# Patient Record
Sex: Male | Born: 1935 | Race: White | Hispanic: No | Marital: Married | Smoking: Former smoker
Health system: Southern US, Community
[De-identification: ages and names within clinical notes are randomized; demographics above are authoritative.]

## PROBLEM LIST (undated history)

## (undated) DIAGNOSIS — I4891 Unspecified atrial fibrillation: Secondary | ICD-10-CM

## (undated) DIAGNOSIS — I639 Cerebral infarction, unspecified: Secondary | ICD-10-CM

## (undated) DIAGNOSIS — H9190 Unspecified hearing loss, unspecified ear: Secondary | ICD-10-CM

## (undated) HISTORY — DX: Cerebral infarction, unspecified: I63.9

## (undated) HISTORY — DX: Unspecified hearing loss, unspecified ear: H91.90

---

## 2006-01-26 ENCOUNTER — Emergency Department (HOSPITAL_COMMUNITY): Admission: EM | Admit: 2006-01-26 | Discharge: 2006-01-26 | Payer: Self-pay | Admitting: Emergency Medicine

## 2016-03-06 DIAGNOSIS — I4892 Unspecified atrial flutter: Secondary | ICD-10-CM | POA: Insufficient documentation

## 2016-03-06 DIAGNOSIS — E7849 Other hyperlipidemia: Secondary | ICD-10-CM | POA: Insufficient documentation

## 2016-03-06 DIAGNOSIS — I509 Heart failure, unspecified: Secondary | ICD-10-CM | POA: Insufficient documentation

## 2016-03-06 DIAGNOSIS — I5022 Chronic systolic (congestive) heart failure: Secondary | ICD-10-CM | POA: Insufficient documentation

## 2016-03-06 DIAGNOSIS — I4891 Unspecified atrial fibrillation: Secondary | ICD-10-CM | POA: Insufficient documentation

## 2016-03-06 DIAGNOSIS — I619 Nontraumatic intracerebral hemorrhage, unspecified: Secondary | ICD-10-CM | POA: Insufficient documentation

## 2016-05-01 DIAGNOSIS — I10 Essential (primary) hypertension: Secondary | ICD-10-CM | POA: Insufficient documentation

## 2018-03-13 DIAGNOSIS — J309 Allergic rhinitis, unspecified: Secondary | ICD-10-CM | POA: Insufficient documentation

## 2018-03-13 DIAGNOSIS — H9190 Unspecified hearing loss, unspecified ear: Secondary | ICD-10-CM | POA: Insufficient documentation

## 2018-03-13 DIAGNOSIS — D61818 Other pancytopenia: Secondary | ICD-10-CM | POA: Insufficient documentation

## 2018-03-13 DIAGNOSIS — G4733 Obstructive sleep apnea (adult) (pediatric): Secondary | ICD-10-CM | POA: Insufficient documentation

## 2018-03-13 DIAGNOSIS — E039 Hypothyroidism, unspecified: Secondary | ICD-10-CM | POA: Insufficient documentation

## 2018-03-13 DIAGNOSIS — Z8619 Personal history of other infectious and parasitic diseases: Secondary | ICD-10-CM | POA: Insufficient documentation

## 2018-03-13 DIAGNOSIS — K5900 Constipation, unspecified: Secondary | ICD-10-CM | POA: Insufficient documentation

## 2018-03-13 DIAGNOSIS — N400 Enlarged prostate without lower urinary tract symptoms: Secondary | ICD-10-CM | POA: Insufficient documentation

## 2018-04-22 DIAGNOSIS — N320 Bladder-neck obstruction: Secondary | ICD-10-CM | POA: Insufficient documentation

## 2018-10-14 DIAGNOSIS — H0100A Unspecified blepharitis right eye, upper and lower eyelids: Secondary | ICD-10-CM | POA: Insufficient documentation

## 2018-10-14 DIAGNOSIS — R7303 Prediabetes: Secondary | ICD-10-CM | POA: Insufficient documentation

## 2018-10-14 DIAGNOSIS — H43812 Vitreous degeneration, left eye: Secondary | ICD-10-CM | POA: Insufficient documentation

## 2018-10-14 DIAGNOSIS — H02831 Dermatochalasis of right upper eyelid: Secondary | ICD-10-CM | POA: Insufficient documentation

## 2018-11-14 DIAGNOSIS — E538 Deficiency of other specified B group vitamins: Secondary | ICD-10-CM | POA: Insufficient documentation

## 2019-03-13 DIAGNOSIS — R053 Chronic cough: Secondary | ICD-10-CM | POA: Insufficient documentation

## 2019-08-05 DIAGNOSIS — D469 Myelodysplastic syndrome, unspecified: Secondary | ICD-10-CM | POA: Insufficient documentation

## 2020-07-27 DIAGNOSIS — R9389 Abnormal findings on diagnostic imaging of other specified body structures: Secondary | ICD-10-CM | POA: Insufficient documentation

## 2020-08-06 DIAGNOSIS — I609 Nontraumatic subarachnoid hemorrhage, unspecified: Secondary | ICD-10-CM | POA: Insufficient documentation

## 2020-08-24 DIAGNOSIS — J984 Other disorders of lung: Secondary | ICD-10-CM | POA: Insufficient documentation

## 2020-09-09 DIAGNOSIS — R972 Elevated prostate specific antigen [PSA]: Secondary | ICD-10-CM | POA: Insufficient documentation

## 2020-09-09 DIAGNOSIS — N402 Nodular prostate without lower urinary tract symptoms: Secondary | ICD-10-CM | POA: Insufficient documentation

## 2020-09-24 DIAGNOSIS — R300 Dysuria: Secondary | ICD-10-CM | POA: Insufficient documentation

## 2021-08-09 DIAGNOSIS — H401131 Primary open-angle glaucoma, bilateral, mild stage: Secondary | ICD-10-CM | POA: Insufficient documentation

## 2021-08-09 DIAGNOSIS — H18513 Endothelial corneal dystrophy, bilateral: Secondary | ICD-10-CM | POA: Insufficient documentation

## 2021-08-09 DIAGNOSIS — H04123 Dry eye syndrome of bilateral lacrimal glands: Secondary | ICD-10-CM | POA: Insufficient documentation

## 2021-09-12 ENCOUNTER — Inpatient Hospital Stay (HOSPITAL_COMMUNITY): Payer: Medicare Other

## 2021-09-12 ENCOUNTER — Emergency Department (HOSPITAL_COMMUNITY): Payer: Medicare Other

## 2021-09-12 ENCOUNTER — Inpatient Hospital Stay (HOSPITAL_COMMUNITY)
Admission: EM | Admit: 2021-09-12 | Discharge: 2021-09-15 | DRG: 023 | Disposition: A | Discharge status: Home Health | Payer: Medicare Other | Attending: Interventional Radiology | Admitting: Interventional Radiology

## 2021-09-12 ENCOUNTER — Emergency Department (HOSPITAL_COMMUNITY): Payer: Medicare Other | Admitting: Certified Registered Nurse Anesthetist

## 2021-09-12 ENCOUNTER — Encounter (HOSPITAL_COMMUNITY): Payer: Self-pay | Admitting: Emergency Medicine

## 2021-09-12 ENCOUNTER — Encounter (HOSPITAL_COMMUNITY)
Admission: EM | Disposition: A | Discharge status: Home Health | Payer: Self-pay | Source: Home / Self Care | Attending: Neurology

## 2021-09-12 ENCOUNTER — Emergency Department (EMERGENCY_DEPARTMENT_HOSPITAL): Payer: Medicare Other | Admitting: Certified Registered Nurse Anesthetist

## 2021-09-12 ENCOUNTER — Other Ambulatory Visit: Payer: Self-pay

## 2021-09-12 DIAGNOSIS — I5042 Chronic combined systolic (congestive) and diastolic (congestive) heart failure: Secondary | ICD-10-CM | POA: Diagnosis present

## 2021-09-12 DIAGNOSIS — D61818 Other pancytopenia: Secondary | ICD-10-CM | POA: Diagnosis present

## 2021-09-12 DIAGNOSIS — D539 Nutritional anemia, unspecified: Secondary | ICD-10-CM | POA: Diagnosis present

## 2021-09-12 DIAGNOSIS — T82538A Leakage of other cardiac and vascular devices and implants, initial encounter: Secondary | ICD-10-CM | POA: Diagnosis present

## 2021-09-12 DIAGNOSIS — I63311 Cerebral infarction due to thrombosis of right middle cerebral artery: Principal | ICD-10-CM | POA: Diagnosis present

## 2021-09-12 DIAGNOSIS — Z8249 Family history of ischemic heart disease and other diseases of the circulatory system: Secondary | ICD-10-CM

## 2021-09-12 DIAGNOSIS — I700 Atherosclerosis of aorta: Secondary | ICD-10-CM | POA: Diagnosis present

## 2021-09-12 DIAGNOSIS — I63511 Cerebral infarction due to unspecified occlusion or stenosis of right middle cerebral artery: Secondary | ICD-10-CM | POA: Diagnosis not present

## 2021-09-12 DIAGNOSIS — I4821 Permanent atrial fibrillation: Secondary | ICD-10-CM | POA: Diagnosis present

## 2021-09-12 DIAGNOSIS — N400 Enlarged prostate without lower urinary tract symptoms: Secondary | ICD-10-CM | POA: Diagnosis present

## 2021-09-12 DIAGNOSIS — I4891 Unspecified atrial fibrillation: Secondary | ICD-10-CM | POA: Diagnosis not present

## 2021-09-12 DIAGNOSIS — R29810 Facial weakness: Secondary | ICD-10-CM | POA: Diagnosis present

## 2021-09-12 DIAGNOSIS — I428 Other cardiomyopathies: Secondary | ICD-10-CM | POA: Diagnosis present

## 2021-09-12 DIAGNOSIS — I110 Hypertensive heart disease with heart failure: Secondary | ICD-10-CM | POA: Diagnosis present

## 2021-09-12 DIAGNOSIS — I6601 Occlusion and stenosis of right middle cerebral artery: Secondary | ICD-10-CM | POA: Diagnosis not present

## 2021-09-12 DIAGNOSIS — Y712 Prosthetic and other implants, materials and accessory cardiovascular devices associated with adverse incidents: Secondary | ICD-10-CM | POA: Diagnosis present

## 2021-09-12 DIAGNOSIS — Z781 Physical restraint status: Secondary | ICD-10-CM

## 2021-09-12 DIAGNOSIS — G4733 Obstructive sleep apnea (adult) (pediatric): Secondary | ICD-10-CM | POA: Diagnosis present

## 2021-09-12 DIAGNOSIS — R29707 NIHSS score 7: Secondary | ICD-10-CM | POA: Diagnosis present

## 2021-09-12 DIAGNOSIS — G8191 Hemiplegia, unspecified affecting right dominant side: Secondary | ICD-10-CM | POA: Diagnosis present

## 2021-09-12 DIAGNOSIS — E1159 Type 2 diabetes mellitus with other circulatory complications: Secondary | ICD-10-CM

## 2021-09-12 DIAGNOSIS — R451 Restlessness and agitation: Secondary | ICD-10-CM | POA: Diagnosis not present

## 2021-09-12 DIAGNOSIS — R471 Dysarthria and anarthria: Secondary | ICD-10-CM | POA: Diagnosis present

## 2021-09-12 DIAGNOSIS — R414 Neurologic neglect syndrome: Secondary | ICD-10-CM | POA: Diagnosis present

## 2021-09-12 DIAGNOSIS — J9589 Other postprocedural complications and disorders of respiratory system, not elsewhere classified: Secondary | ICD-10-CM | POA: Diagnosis not present

## 2021-09-12 DIAGNOSIS — E039 Hypothyroidism, unspecified: Secondary | ICD-10-CM | POA: Diagnosis present

## 2021-09-12 DIAGNOSIS — Z95818 Presence of other cardiac implants and grafts: Secondary | ICD-10-CM

## 2021-09-12 DIAGNOSIS — I361 Nonrheumatic tricuspid (valve) insufficiency: Secondary | ICD-10-CM | POA: Diagnosis not present

## 2021-09-12 DIAGNOSIS — G9751 Postprocedural hemorrhage and hematoma of a nervous system organ or structure following a nervous system procedure: Secondary | ICD-10-CM | POA: Diagnosis not present

## 2021-09-12 DIAGNOSIS — Z20822 Contact with and (suspected) exposure to covid-19: Secondary | ICD-10-CM | POA: Diagnosis present

## 2021-09-12 DIAGNOSIS — J84111 Idiopathic interstitial pneumonia, not otherwise specified: Secondary | ICD-10-CM | POA: Diagnosis present

## 2021-09-12 DIAGNOSIS — I639 Cerebral infarction, unspecified: Secondary | ICD-10-CM | POA: Diagnosis present

## 2021-09-12 DIAGNOSIS — I152 Hypertension secondary to endocrine disorders: Secondary | ICD-10-CM

## 2021-09-12 DIAGNOSIS — J952 Acute pulmonary insufficiency following nonthoracic surgery: Secondary | ICD-10-CM | POA: Diagnosis not present

## 2021-09-12 DIAGNOSIS — Z8673 Personal history of transient ischemic attack (TIA), and cerebral infarction without residual deficits: Secondary | ICD-10-CM | POA: Diagnosis not present

## 2021-09-12 DIAGNOSIS — R1310 Dysphagia, unspecified: Secondary | ICD-10-CM | POA: Diagnosis present

## 2021-09-12 DIAGNOSIS — I340 Nonrheumatic mitral (valve) insufficiency: Secondary | ICD-10-CM | POA: Diagnosis not present

## 2021-09-12 DIAGNOSIS — E785 Hyperlipidemia, unspecified: Secondary | ICD-10-CM | POA: Diagnosis present

## 2021-09-12 DIAGNOSIS — R0989 Other specified symptoms and signs involving the circulatory and respiratory systems: Secondary | ICD-10-CM | POA: Diagnosis not present

## 2021-09-12 DIAGNOSIS — I482 Chronic atrial fibrillation, unspecified: Secondary | ICD-10-CM | POA: Diagnosis not present

## 2021-09-12 DIAGNOSIS — I6389 Other cerebral infarction: Secondary | ICD-10-CM | POA: Diagnosis not present

## 2021-09-12 HISTORY — PX: IR CT HEAD LTD: IMG2386

## 2021-09-12 HISTORY — DX: Unspecified atrial fibrillation: I48.91

## 2021-09-12 HISTORY — PX: IR PERCUTANEOUS ART THROMBECTOMY/INFUSION INTRACRANIAL INC DIAG ANGIO: IMG6087

## 2021-09-12 HISTORY — PX: RADIOLOGY WITH ANESTHESIA: SHX6223

## 2021-09-12 LAB — COMPREHENSIVE METABOLIC PANEL
ALT: 17 U/L (ref 0–44)
AST: 19 U/L (ref 15–41)
Albumin: 4 g/dL (ref 3.5–5.0)
Alkaline Phosphatase: 82 U/L (ref 38–126)
Anion gap: 7 (ref 5–15)
BUN: 16 mg/dL (ref 8–23)
CO2: 26 mmol/L (ref 22–32)
Calcium: 9.2 mg/dL (ref 8.9–10.3)
Chloride: 104 mmol/L (ref 98–111)
Creatinine, Ser: 1.07 mg/dL (ref 0.61–1.24)
GFR, Estimated: >60 mL/min (ref >60)
Glucose, Bld: 139 mg/dL — ABNORMAL HIGH (ref 70–99)
Potassium: 3.9 mmol/L (ref 3.5–5.1)
Sodium: 137 mmol/L (ref 135–145)
Total Bilirubin: 0.7 mg/dL (ref 0.3–1.2)
Total Protein: 6.8 g/dL (ref 6.5–8.1)

## 2021-09-12 LAB — POCT I-STAT 7, (LYTES, BLD GAS, ICA,H+H)
Acid-Base Excess: 4 mmol/L — ABNORMAL HIGH (ref 0.0–2.0)
Bicarbonate: 27.5 mmol/L (ref 20.0–28.0)
Calcium, Ion: 1.18 mmol/L (ref 1.15–1.40)
HCT: 31 % — ABNORMAL LOW (ref 39.0–52.0)
Hemoglobin: 10.5 g/dL — ABNORMAL LOW (ref 13.0–17.0)
O2 Saturation: 100 %
Patient temperature: 97.8
Potassium: 4.2 mmol/L (ref 3.5–5.1)
Sodium: 141 mmol/L (ref 135–145)
TCO2: 29 mmol/L (ref 22–32)
pCO2 arterial: 35.1 mmHg (ref 32–48)
pH, Arterial: 7.501 — ABNORMAL HIGH (ref 7.35–7.45)
pO2, Arterial: 224 mmHg — ABNORMAL HIGH (ref 83–108)

## 2021-09-12 LAB — CBC
HCT: 36.3 % — ABNORMAL LOW (ref 39.0–52.0)
Hemoglobin: 12 g/dL — ABNORMAL LOW (ref 13.0–17.0)
MCH: 33.1 pg (ref 26.0–34.0)
MCHC: 33.1 g/dL (ref 30.0–36.0)
MCV: 100.3 fL — ABNORMAL HIGH (ref 80.0–100.0)
Platelets: 126 10*3/uL — ABNORMAL LOW (ref 150–400)
RBC: 3.62 MIL/uL — ABNORMAL LOW (ref 4.22–5.81)
RDW: 13.8 % (ref 11.5–15.5)
WBC: 4.7 10*3/uL (ref 4.0–10.5)
nRBC: 0 % (ref 0.0–0.2)

## 2021-09-12 LAB — LIPID PANEL
Cholesterol: 175 mg/dL (ref 0–200)
HDL: 59 mg/dL (ref >40)
LDL Cholesterol: 103 mg/dL — ABNORMAL HIGH (ref 0–99)
Total CHOL/HDL Ratio: 3 RATIO
Triglycerides: 63 mg/dL (ref <150)
VLDL: 13 mg/dL (ref 0–40)

## 2021-09-12 LAB — I-STAT CHEM 8, ED
BUN: 20 mg/dL (ref 8–23)
Calcium, Ion: 1.17 mmol/L (ref 1.15–1.40)
Chloride: 102 mmol/L (ref 98–111)
Creatinine, Ser: 1 mg/dL (ref 0.61–1.24)
Glucose, Bld: 139 mg/dL — ABNORMAL HIGH (ref 70–99)
HCT: 39 % (ref 39.0–52.0)
Hemoglobin: 13.3 g/dL (ref 13.0–17.0)
Potassium: 4.1 mmol/L (ref 3.5–5.1)
Sodium: 140 mmol/L (ref 135–145)
TCO2: 29 mmol/L (ref 22–32)

## 2021-09-12 LAB — CBG MONITORING, ED: Glucose-Capillary: 130 mg/dL — ABNORMAL HIGH (ref 70–99)

## 2021-09-12 LAB — URINALYSIS, COMPLETE (UACMP) WITH MICROSCOPIC
Bacteria, UA: NONE SEEN
Bilirubin Urine: NEGATIVE
Glucose, UA: NEGATIVE mg/dL
Hgb urine dipstick: NEGATIVE
Ketones, ur: NEGATIVE mg/dL
Leukocytes,Ua: NEGATIVE
Nitrite: NEGATIVE
Protein, ur: NEGATIVE mg/dL
Specific Gravity, Urine: 1.016 (ref 1.005–1.030)
pH: 7 (ref 5.0–8.0)

## 2021-09-12 LAB — DIFFERENTIAL
Abs Immature Granulocytes: 0.01 10*3/uL (ref 0.00–0.07)
Basophils Absolute: 0 10*3/uL (ref 0.0–0.1)
Basophils Relative: 0 %
Eosinophils Absolute: 0.3 10*3/uL (ref 0.0–0.5)
Eosinophils Relative: 7 %
Immature Granulocytes: 0 %
Lymphocytes Relative: 36 %
Lymphs Abs: 1.7 10*3/uL (ref 0.7–4.0)
Monocytes Absolute: 0.7 10*3/uL (ref 0.1–1.0)
Monocytes Relative: 14 %
Neutro Abs: 2 10*3/uL (ref 1.7–7.7)
Neutrophils Relative %: 43 %

## 2021-09-12 LAB — RESP PANEL BY RT-PCR (FLU A&B, COVID) ARPGX2
Influenza A by PCR: NEGATIVE
Influenza B by PCR: NEGATIVE
SARS Coronavirus 2 by RT PCR: NEGATIVE

## 2021-09-12 LAB — RAPID URINE DRUG SCREEN, HOSP PERFORMED
Amphetamines: NOT DETECTED
Barbiturates: NOT DETECTED
Benzodiazepines: NOT DETECTED
Cocaine: NOT DETECTED
Opiates: NOT DETECTED
Tetrahydrocannabinol: NOT DETECTED

## 2021-09-12 LAB — HEMOGLOBIN A1C
Hgb A1c MFr Bld: 5.7 % — ABNORMAL HIGH (ref 4.8–5.6)
Mean Plasma Glucose: 116.89 mg/dL

## 2021-09-12 LAB — GLUCOSE, CAPILLARY
Glucose-Capillary: 93 mg/dL (ref 70–99)
Glucose-Capillary: 146 mg/dL — ABNORMAL HIGH (ref 70–99)

## 2021-09-12 LAB — PROTIME-INR
INR: 1 (ref 0.8–1.2)
Prothrombin Time: 13.5 seconds (ref 11.4–15.2)

## 2021-09-12 LAB — MRSA NEXT GEN BY PCR, NASAL: MRSA by PCR Next Gen: NOT DETECTED

## 2021-09-12 LAB — APTT: aPTT: 29 seconds (ref 24–36)

## 2021-09-12 SURGERY — IR WITH ANESTHESIA
Anesthesia: General

## 2021-09-12 MED ORDER — ORAL CARE MOUTH RINSE
15.0000 mL | OROMUCOSAL | Status: DC
  Administered 2021-09-12 – 2021-09-13 (×8): 15 mL via OROMUCOSAL

## 2021-09-12 MED ORDER — ACETAMINOPHEN 160 MG/5ML PO SOLN
650.0000 mg | ORAL | Status: DC | PRN
Start: 2021-09-12 — End: 2021-09-12

## 2021-09-12 MED ORDER — PROPOFOL 10 MG/ML IV BOLUS
INTRAVENOUS | Status: DC | PRN
Start: 2021-09-12 — End: 2021-09-12
  Administered 2021-09-12: 50 mg via INTRAVENOUS
  Administered 2021-09-12: 110 mg via INTRAVENOUS

## 2021-09-12 MED ORDER — SUCCINYLCHOLINE CHLORIDE 200 MG/10ML IV SOSY
PREFILLED_SYRINGE | INTRAVENOUS | Status: DC | PRN
  Administered 2021-09-12: 120 mg via INTRAVENOUS

## 2021-09-12 MED ORDER — CEFAZOLIN SODIUM-DEXTROSE 2-3 GM-%(50ML) IV SOLR
INTRAVENOUS | Status: DC | PRN
  Administered 2021-09-12: 2 g via INTRAVENOUS

## 2021-09-12 MED ORDER — ACETAMINOPHEN 325 MG PO TABS: 650.0000 mg | ORAL_TABLET | ORAL | Status: DC | PRN

## 2021-09-12 MED ORDER — SODIUM CHLORIDE 0.9% FLUSH: 3.0000 mL | Freq: Once | INTRAVENOUS | Status: DC

## 2021-09-12 MED ORDER — ONDANSETRON HCL 4 MG/2ML IJ SOLN
INTRAMUSCULAR | Status: DC | PRN
  Administered 2021-09-12: 4 mg via INTRAVENOUS

## 2021-09-12 MED ORDER — PANTOPRAZOLE 2 MG/ML SUSPENSION
40.0000 mg | Freq: Every day | ORAL | Status: DC
  Administered 2021-09-12 – 2021-09-13 (×2): 40 mg
  Filled 2021-09-12 (×2): qty 20

## 2021-09-12 MED ORDER — CHLORHEXIDINE GLUCONATE 0.12% ORAL RINSE (MEDLINE KIT)
15.0000 mL | Freq: Two times a day (BID) | OROMUCOSAL | Status: DC
  Administered 2021-09-12 – 2021-09-13 (×2): 15 mL via OROMUCOSAL

## 2021-09-12 MED ORDER — ACETAMINOPHEN 650 MG RE SUPP: 650.0000 mg | RECTAL | Status: DC | PRN

## 2021-09-12 MED ORDER — DEXAMETHASONE SODIUM PHOSPHATE 10 MG/ML IJ SOLN
INTRAMUSCULAR | Status: DC | PRN
  Administered 2021-09-12: 10 mg via INTRAVENOUS

## 2021-09-12 MED ORDER — MIDAZOLAM HCL 2 MG/2ML IJ SOLN: 1.0000 mg | INTRAMUSCULAR | Status: DC | PRN

## 2021-09-12 MED ORDER — ACETAMINOPHEN 325 MG PO TABS
650.0000 mg | ORAL_TABLET | ORAL | Status: DC | PRN
  Administered 2021-09-15: 650 mg via ORAL
  Filled 2021-09-12: qty 2

## 2021-09-12 MED ORDER — IOHEXOL 300 MG/ML  SOLN
100.0000 mL | Freq: Once | INTRAMUSCULAR | Status: AC | PRN
  Administered 2021-09-12: 60 mL via INTRA_ARTERIAL

## 2021-09-12 MED ORDER — ACETAMINOPHEN 160 MG/5ML PO SOLN
650.0000 mg | ORAL | Status: DC | PRN
Start: 2021-09-12 — End: 2021-09-15

## 2021-09-12 MED ORDER — CLEVIDIPINE BUTYRATE 0.5 MG/ML IV EMUL
INTRAVENOUS | Status: AC
  Filled 2021-09-12: qty 50

## 2021-09-12 MED ORDER — SODIUM CHLORIDE 0.9 % IV SOLN: INTRAVENOUS | Status: DC

## 2021-09-12 MED ORDER — FENTANYL CITRATE PF 50 MCG/ML IJ SOSY: 25.0000 ug | PREFILLED_SYRINGE | INTRAMUSCULAR | Status: DC | PRN

## 2021-09-12 MED ORDER — SODIUM CHLORIDE 0.9 % IV SOLN: INTRAVENOUS | Status: DC | PRN

## 2021-09-12 MED ORDER — CEFAZOLIN SODIUM-DEXTROSE 2-4 GM/100ML-% IV SOLN
INTRAVENOUS | Status: AC
  Filled 2021-09-12: qty 100

## 2021-09-12 MED ORDER — PHENYLEPHRINE HCL-NACL 20-0.9 MG/250ML-% IV SOLN
INTRAVENOUS | Status: DC | PRN
  Administered 2021-09-12: 20 ug/min via INTRAVENOUS

## 2021-09-12 MED ORDER — FENTANYL CITRATE PF 50 MCG/ML IJ SOSY
25.0000 ug | PREFILLED_SYRINGE | INTRAMUSCULAR | Status: DC | PRN
  Administered 2021-09-13 (×2): 50 ug via INTRAVENOUS
  Filled 2021-09-12: qty 1
  Filled 2021-09-12: qty 2

## 2021-09-12 MED ORDER — CLEVIDIPINE BUTYRATE 0.5 MG/ML IV EMUL
0.0000 mg/h | INTRAVENOUS | Status: AC
  Administered 2021-09-12: 14 mg/h via INTRAVENOUS
  Administered 2021-09-12: 2 mg/h via INTRAVENOUS
  Administered 2021-09-13: 4 mg/h via INTRAVENOUS
  Filled 2021-09-12 (×2): qty 50

## 2021-09-12 MED ORDER — POLYETHYLENE GLYCOL 3350 17 G PO PACK
17.0000 g | PACK | Freq: Every day | ORAL | Status: DC
  Administered 2021-09-13: 17 g
  Filled 2021-09-12 (×2): qty 1

## 2021-09-12 MED ORDER — PHENYLEPHRINE 80 MCG/ML (10ML) SYRINGE FOR IV PUSH (FOR BLOOD PRESSURE SUPPORT)
PREFILLED_SYRINGE | INTRAVENOUS | Status: DC | PRN
  Administered 2021-09-12: 80 ug via INTRAVENOUS

## 2021-09-12 MED ORDER — NITROGLYCERIN 1 MG/10 ML FOR IR/CATH LAB
INTRA_ARTERIAL | Status: AC
  Filled 2021-09-12: qty 10

## 2021-09-12 MED ORDER — MIDAZOLAM HCL 2 MG/2ML IJ SOLN
1.0000 mg | INTRAMUSCULAR | Status: DC | PRN
  Administered 2021-09-13: 1 mg via INTRAVENOUS
  Filled 2021-09-12: qty 2

## 2021-09-12 MED ORDER — DOCUSATE SODIUM 50 MG/5ML PO LIQD
100.0000 mg | Freq: Two times a day (BID) | ORAL | Status: DC
  Administered 2021-09-12 – 2021-09-13 (×2): 100 mg
  Filled 2021-09-12 (×2): qty 10

## 2021-09-12 MED ORDER — PROPOFOL 1000 MG/100ML IV EMUL
0.0000 ug/kg/min | INTRAVENOUS | Status: DC
  Administered 2021-09-12 (×2): 40 ug/kg/min via INTRAVENOUS
  Administered 2021-09-12: 50 ug/kg/min via INTRAVENOUS
  Administered 2021-09-13: 35 ug/kg/min via INTRAVENOUS
  Administered 2021-09-13: 50 ug/kg/min via INTRAVENOUS
  Filled 2021-09-12: qty 200
  Filled 2021-09-12: qty 100

## 2021-09-12 MED ORDER — IOHEXOL 350 MG/ML SOLN
100.0000 mL | Freq: Once | INTRAVENOUS | Status: AC | PRN
  Administered 2021-09-12: 70 mL via INTRAVENOUS

## 2021-09-12 MED ORDER — ROCURONIUM BROMIDE 10 MG/ML (PF) SYRINGE
PREFILLED_SYRINGE | INTRAVENOUS | Status: DC | PRN
  Administered 2021-09-12: 40 mg via INTRAVENOUS
  Administered 2021-09-12: 50 mg via INTRAVENOUS

## 2021-09-12 MED ORDER — DOCUSATE SODIUM 50 MG/5ML PO LIQD: 100.0000 mg | Freq: Two times a day (BID) | ORAL | Status: DC

## 2021-09-12 MED ORDER — STROKE: EARLY STAGES OF RECOVERY BOOK
Freq: Once | Status: AC
Start: 2021-09-12 — End: 2021-09-12
  Filled 2021-09-12: qty 1

## 2021-09-12 MED ORDER — LIDOCAINE 2% (20 MG/ML) 5 ML SYRINGE
INTRAMUSCULAR | Status: DC | PRN
  Administered 2021-09-12: 60 mg via INTRAVENOUS

## 2021-09-12 NOTE — Anesthesia Procedure Notes (Addendum)
Procedure Name: Intubation Date/Time: 09/12/2021 4:03 PM Performed by: Genelle Bal, CRNA Pre-anesthesia Checklist: Patient identified, Emergency Drugs available, Suction available and Patient being monitored Patient Re-evaluated:Patient Re-evaluated prior to induction Oxygen Delivery Method: Circle system utilized Preoxygenation: Pre-oxygenation with 100% oxygen Induction Type: IV induction and Rapid sequence Laryngoscope Size: Miller and 2 Grade View: Grade I Tube type: Oral Tube size: 7.5 mm Number of attempts: 1 Airway Equipment and Method: Stylet Placement Confirmation: ETT inserted through vocal cords under direct vision, positive ETCO2 and breath sounds checked- equal and bilateral Secured at: 23 cm Tube secured with: Tape Dental Injury: Teeth and Oropharynx as per pre-operative assessment

## 2021-09-12 NOTE — Transfer of Care (Signed)
Immediate Anesthesia Transfer of Care Note  Patient: Elijah Stone  Procedure(s) Performed: IR WITH ANESTHESIA  Patient Location: ICU  Anesthesia Type:General  Level of Consciousness: Patient remains intubated per anesthesia plan  Airway & Oxygen Therapy: Patient remains intubated per anesthesia plan and Patient placed on Ventilator (see vital sign flow sheet for setting)  Post-op Assessment: Report given to RN and Post -op Vital signs reviewed and stable  Post vital signs: Reviewed and stable  Last Vitals:  Vitals Value Taken Time  BP 125/86 09/12/21 1733  Temp    Pulse 92 09/12/21 1732  Resp 16 09/12/21 1732  SpO2 100 % 09/12/21 1732  Vitals shown include unvalidated device data.  Last Pain:  Vitals:   09/12/21 1511  PainSc: 0-No pain         Complications: No notable events documented.

## 2021-09-12 NOTE — Progress Notes (Signed)
Patient ID: Elijah Stone, male   DOB: 1931/02/05, 88 y.o.   MRN: [id number]. INR. 88 year old right-handed gentleman.  Last seen well 2 PM today.  Modified Rankin score of 0. New onset of left-sided weakness, left facial droop dysarthria and neglect. CT of brain no intracranial hemorrhage.  Aspects 10.  CTA occluded distal M2 region of the inferior division of the right middle cerebral artery.  Endovascular treatment discussed with the spouse and the son.  The procedure, the reasons, and the alternatives were reviewed.  Risks of intracranial hemorrhage of 10% with worsening neurological condition, death and inability to revascularize were reviewed.  They both expressed understanding, and informed consent was obtained from the spouse for the treatment.  Arlean Hopping MD

## 2021-09-12 NOTE — H&P (Cosign Needed Addendum)
Neurology H&P  CC: Code Stroke  History is obtained from:Patient, family  HPI: Elijah Stone is a 88 y.o. male  [place] in 2021, afib with watchman on aspirin, CHF, SAH, pancytopenia, HTN, hypothyroidism, BPH, OSA with cpap initially presenting with left facial droop, left arm weakness, and dysarthria. Symptoms fluctuated throughout assessment and CT scan. CTA showed a right M2 occlusion, code IR activated.     LKW: 1400 tpa given?: No, recent SAH IR Thrombectomy? Yes Modified Rankin Scale: 1-No significant post stroke disability and can perform usual duties with stroke symptoms  NIHSS components Score: Comment  1a Level of Conscious 0'[x]'$  1'[]'$  2'[]'$  3'[]'$         1b LOC Questions 0'[x]'$  1'[]'$  2'[]'$           1c LOC Commands 0'[x]'$  1'[]'$  2'[]'$           2 Best Gaze 0'[]'$  1'[]'$  2'[]'$           3 Visual 0'[]'$  1'[x]'$  2'[]'$  3'[]'$         4 Facial Palsy 0'[]'$  1'[x]'$  2'[]'$  3'[]'$         5a Motor Arm - left 0'[]'$  1'[x]'$  2'[]'$  3'[]'$  4'[]'$  UN'[]'$     5b Motor Arm - Right 0'[x]'$  1'[]'$  2'[]'$  3'[]'$  4'[]'$  UN'[]'$     6a Motor Leg - Left 0'[x]'$  1'[]'$  2'[]'$  3'[]'$  4'[]'$  UN'[]'$     6b Motor Leg - Right 0'[x]'$  1'[]'$  2'[]'$  3'[]'$  4'[]'$  UN'[]'$     7 Limb Ataxia 0'[]'$  1'[x]'$  2'[]'$  3'[]'$  UN'[]'$       8 Sensory 0'[]'$  1'[x]'$  2'[]'$  UN'[]'$         9 Best Language 0'[]'$  1'[]'$  2'[]'$  3'[]'$         10 Dysarthria 0'[]'$  1'[x]'$  2'[]'$  UN'[]'$         11 Extinct. and Inattention 0'[]'$  1'[x]'$  2'[]'$           TOTAL: 7         ROS: A complete ROS was performed and is negative except as noted in the HPI.   Past Medical History:  Diagnosis Date   Atrial fibrillation ([place])      No family history on file.   Social History:  has no history on file for tobacco use, alcohol use, and drug use.   Prior to Admission medications   Not on File   aspirin 81 MG chewable tablet *ANTIPLATELET*  Sig - Route: Chew 1 tablet (81 mg total) by mouth daily. - Oral  Class: Historical Med  carboxymethylcellulose sodium (REFRESH OPHT)  Sig - Route: Place 1-2 drops into both eyes 2 times daily. - Both Eyes  Class: Historical Med  cholecalciferol, vitamin D3, 25 mcg  (1,000 unit) capsule  Sig - Route: Take 1 capsule (1,000 Units total) by mouth daily. - Oral  Class: Historical Med  cyanocobalamin (VITAMIN B12) 500 mcg sublingual tablet  Sig - Route: Take 1 tablet (500 mcg total) by mouth daily. - Oral  Class: OTC  cycloSPORINE (RESTASIS) 0.05 % ophthalmic emulsion  Sig - Route: Place 1 drop into both eyes 2 times daily. - Both Eyes  ENTRESTO 24-26 mg per tablet  Sig: TAKE 1 TABLET BY MOUTH TWICE DAILY  fluocinonide (LIDEX) 0.05 % cream  Sig - Route: Apply 1 application topically 2 (two) times daily as needed. - Topical  Class: Historical Med  fluticasone propionate (FLONASE ALLERGY RELIEF) 50 mcg/actuation nasal spray  Sig - Route: 1 spray by Nasal route daily. - Nasal  Class: OTC  latanoprost (XALATAN) 0.005 % ophthalmic solution  Sig - Route: Place 1  drop into both eyes daily. - Both Eyes  levocetirizine (XYZAL) 5 MG tablet  Sig - Route: Take 1 tablet (5 mg total) by mouth every evening. - Oral  Class: OTC  levothyroxine (SYNTHROID) 88 MCG tablet  Sig: TAKE 1 TABLET(88 MCG) BY MOUTH EVERY DAY  mupirocin (BACTROBAN) 2 % cream  Sig: Apply twice a day on affected areas  omeprazole (PRILOSEC) 40 MG capsule  Sig - Route: Take 1 capsule (40 mg total) by mouth daily. - Oral  polyethylene glycol (MIRALAX) 17 gram packet  Sig - Route: Take 17 g by mouth daily. - Oral  Class: Historical Med  sodium chloride (MURO 128) 2 % ophthalmic solution  Sig - Route: Place 1 drop into both eyes at bedtime. - Both Eyes  Class: Historical Med  sodium chloride/sodium bicarb (NEILMED SINUS RINSE REFILL SINI)  Sig - Route: by sinus irrigation route daily. - sinus irrigation  Class: Historical Med  metoPROLOL succinate (TOPROL-XL) 25 MG 24 hr tablet  Sig - Route: Take 0.5 tablets (12.5 mg total) by mouth daily. - Oral    Exam: Current vital signs: BP 132/73   Pulse 74   Temp 97.8 F (36.6 C)   Resp 17   Wt 78 kg   SpO2 98%    Physical Exam   Constitutional: Appears well-developed and well-nourished.  Psych: Affect appropriate to situation Eyes: No scleral injection HENT: No OP obstrucion Head: Normocephalic.  Cardiovascular: Normal rate and regular rhythm.  Respiratory: Effort normal and breath sounds normal to anterior ascultation GI: Soft.  No distension. There is no tenderness.  Skin: WDI  Neuro: Mental Status: Patient is awake, alert, oriented to person, place, month, year, and situation. Patient is able to give a clear and coherent history. No signs of aphasia Neglecting right side Cranial Nerves: II: Lt hemianopia Pupils are equal, round, and reactive to light.   III,IV, VI: EOMI without ptosis or diploplia.  V: Facial sensation is symmetric to temperature VII: right facial droop  VIII: Hearing is intact to voice X: Palate elevates symmetrically, phonation intact XI: Shoulder shrug is symmetric. XII: Tongue is midline without atrophy or fasciculations.  Motor: Tone is normal. Bulk is normal RUE 5/5 LUE 3/5- progressively worsened throughout exam andCT scan RLE 5/5 LLE 4+/5 Sensory: Sensation is symmetric to light touch and temperature in the arms and legs. No extinction to DSS present.  Cerebellar: FNF and HKS are intact bilaterally   I have reviewed labs in epic and the pertinent results are:   I have reviewed the images obtained: right mid M2 occlusion likely superior branch  Primary Diagnosis:  Cerebral infarction due to thrombosis of right middle cerebral artery.   Secondary Diagnosis: Chronic systolic (congestive) heart failure and Chronic atrial fibrillation  Assessment: 88 y.o. male  [place] in 2021, afib with watchman on aspirin, CHF, SAH, pancytopenia, HTN, hypothyroidism, BPH, OSA with cpap initially presenting with left facial droop, left arm weakness, and dysarthria. Symptoms fluctuated throughout assessment and CT scan. CTA showed a right M2 occlusion, code IR activated.    Plan:  Acute  Ischemic Stroke Cerebral infarction due to embolism of right middle cerebral artery  Acuity: Acute Current Suspected Etiology: Thrombus Rt M2 Continue Evaluation:  -Admit to: Neuro ICU -Continue Aspirin/ Statin --Blood pressure control, goal of SYS < -MRI/ECHO/A1C/Lipid panel. -Hyperglycemia management per SSI to maintain glucose 140-'180mg'$ /dL. -PT/OT/ST therapies and recommendations when able  CNS -Close neuro monitoring  Dysarthria Dysphagia following cerebral infarction  -NPO until cleared by  speech -ST -PT/OT   RESP Acute Respiratory Failure  -vent management per ICU -wean when able  CV -Aggressive BP control, goal SBP < 180 -Titrate oral agents  Heart failure, unspecified Chronic systolic (congestive) heart failure  -TTE  Hyperlipidemia, unspecified  - Statin for goal LDL < 70  Chronic atrial fibrillation -Rate control Watchman device  HEME -Monitor -Transfuse for hgb < 7  ENDO -SSI -goal HgbA1c < 7%  GI/GU -Gentle hydration -avoid nephrotoxic agents  Fluid/Electrolyte Disorders -Replete -Repeat labs -Trend -Per dialysis  ID -CXR -NPO -Monitor  Nutrition NPO  Prophylaxis DVT:  SCDs GI: PPI Bowel: Docusate / Senna when medically appropriate  Diet: NPO until cleared by speech  Code Status: Full Code   THE FOLLOWING WERE PRESENT ON ADMISSION: CNS -  Acute Ischemic Stroke Cardiovascular - Chronic Systolic Diastolic CHF, Atrial fibrillation   This patient is critically ill and at significant risk of neurological worsening, death and care requires constant monitoring of vital signs, hemodynamics,respiratory and cardiac monitoring, neurological assessment, discussion with family, other specialists and medical decision making of high complexity. I spent 30 minutes of neurocritical care time  in the care of  this patient. This was time spent independent of any time provided by nurse practitioner or PA.  Patient seen and examined by  NP/APP with MD. MD to update note as needed.   Janine Ores, DNP, FNP-BC [facility] Pager: [phone]  Attending Attestation:  Patient seen, examined, labs,vitals and notes reviewed. Discussed plan with Marcelino Scot, NP and agree with assessment and plan as documented above. I have independently reviewed the chart, obtained history, review of systems and examined the patient.  Electronically signed by:  Lynnae Sandhoff, MD Page: [phone] 09/12/2021, 7:06 PM

## 2021-09-12 NOTE — Consult Note (Signed)
NAME:  Elijah Stone, MRN:  [id number], DOB:  July 05, 1930, LOS: 0 ADMISSION DATE:  09/12/2021, CONSULTATION DATE:  09/12/21 REFERRING MD:  Dr. Estanislado Pandy, CHIEF COMPLAINT:  Left facial droop   History of Present Illness:  88 y/o M who presented to [facility] on [facility] with reports of left sided facial droop, food falling out of his mouth during lunch and leaning to the left.    The patient presented from home. He was eating lunch with his wife when she noted him to have food falling out of his mouth, dysarthria, left facial droop and leaning to the left. He was last known well at 1400.  EMS was activated to the home as a CODE STROKE.  Initial work up included a UDS which was negative, UA negative, COVID/influenza screening pending. EKG showed atrial fibrillation with IVCD.  Initial labs - Na 137, K 3.9, Cl 104, glucose 139, BUN 16, Sr Cr 1.07, WBC 4.7, Hgb 12, MVC 100.3, and platelets 126.  CT Code stroke showed no evidence of acute abnormality. CTA head showed mid right M2 MCA occlusion within the sylvian fissure, moderate proximal left P PCA stenosis, mild to moderate left M2 MCA stenosis.  The patient was taken emergently to Neuro IR for revascularization. He was not a candidate for tPA given recent SAH.  He underwent a right common carotid arteriogram with findings of occluded M2 M3 region of the inferior division of the right middle cerebral artery.  Dr. Estanislado Pandy performed revascularization of the occluded right MCA with 1 pass with 4x52m retrieval device and contact aspiration achieving a TICI revascularization.  Post CT brain x2 showed moderate amount of contrast plus blood in the right perisylvian and the right temporal parietla subarachnoid regions.  The patient remained intubated post procedure and was returned to ICU on mechanical ventilation.   PCCM consulted for post procedure ICU care.   Pertinent  Medical History  Atrial Fibrillation s/p Watchman on ASA, NICM/ HFrEF, SAH, pancytopenia, HTN,  hypothyroidism, BPH, OSA on CPAP  Significant Hospital Events: Including procedures, antibiotic start and stop dates in addition to other pertinent events   5/22 Admit with left facial droop, work up consistent with CVA  Interim History / Subjective:  Arrived on cleviprex and propofol gtts S/p rocuronium 1711  Objective   Blood pressure 132/73, pulse 74, temperature 97.8 F (36.6 C), resp. rate 17, weight 78 kg, SpO2 98 %.        Intake/Output Summary (Last 24 hours) at 09/12/2021 1719 Last data filed at 09/12/2021 1647 Gross per 24 hour  Intake 50 ml  Output 50 ml  Net 0 ml   Filed Weights   09/12/21 1500  Weight: 78 kg    Examination: General:  Elderly male sedated/ paralyzed on MV in NAD HEENT: MM pink/moist, ETT, no OGT, pupils 2/sluggish Neuro: sedated/ paralyzed CV: irir, rate controlled, right groin site CDI/ no hematoma, R dp is faint at times and seems to be intermittent w/doppler, faint L DP, strong PT bilaterally, both feet warm and appear perfused  PULM:  MV supported breaths, faint left basilar crackles otherwise clear, no wheeze GI: soft, bs+, ND, condom cath- distal end of penis with lateral faint purple discoloration Extremities: warm/dry, no LE edema  Skin: no rashes   Resolved Hospital Problem list    Assessment & Plan:   Acute R M2/ M3 CVA s/p mechanical thrombectomy with TICI - post procedure CTH with moderate amount of contrast plus blood in R perisylvian and right temporal  parietal subarachnoid regions  P:  - per Neuro and NIR - SBP goal 120-140 for 24hrs, cleviprex as needed - neurovascular checks per protocol/ flat per IR - f/u imaging per neuro> repeat CTH in 6 hrs, MRI overnight  - serial neuro exams - ASA/ statin per neuro - TTE/ A1c/ lipid panel ordered  - PT/ OT/ SLP when appropriate  - CBG q 4, add SSI if > 180     Acute respiratory insufficiency post procedure  OSA on CPAP P:  - CXR ordered - clinically monitor for aspiration,  given presentation - ABG now> reduce rate to 12 and wean FiO2 - full MV support, 4-8cc/kg IBW with goal Pplat <30 and DP<15  - VAP prevention protocol/ PPI -PAD protocol for sedation> propofol/ prn fentanyl for RASS goal 0/-1 w/bowel regimen - wean FiO2 as able for SpO2 >92%  - daily SAT & SBT in am  - SLP post extubation     NICM/ HFrEF - 03/30/21 prior EF 35-40%, mild to moderately reduced RVSF HTN - followed by [facility] cardiology P:  - hold home entresto, toprol XL  - cleviprex as above for SBP goals - TTE ordered as above     Afib  - watchman in place 2017 and on home ASA given previous [facility] 2021 P:  - tele monitoring  - currently rate controlled - hold home toprol  - goal K> 4, Mag> 2     Macrocytic anemia Thrombocytopenia, chronic  - hx of pancytopenia dating back to 2013, prior evaluated by heme, felt early MDS, stable w/active surveillance  - baseline plts ~112k P:  - anemia panel in am - trend CBC, transfuse for Hgb< 7, monitor for bleeding    Hypothyroidism - check TSH - likely resume levothyroxine in am  Best Practice (right click and "Reselect all SmartList Selections" daily)   Diet/type: NPO DVT prophylaxis: SCD GI prophylaxis: PPI Lines: N/A Foley:  N/A Code Status:  full code Last date of multidisciplinary goals of care discussion [per primary team]  Labs   CBC: Recent Labs  Lab 09/12/21 1459 09/12/21 1506  WBC 4.7  --   NEUTROABS 2.0  --   HGB 12.0* 13.3  HCT 36.3* 39.0  MCV 100.3*  --   PLT 126*  --     Basic Metabolic Panel: Recent Labs  Lab 09/12/21 1459 09/12/21 1506  NA 137 140  K 3.9 4.1  CL 104 102  CO2 26  --   GLUCOSE 139* 139*  BUN 16 20  CREATININE 1.07 1.00  CALCIUM 9.2  --    GFR: CrCl cannot be calculated (Unknown ideal weight.). Recent Labs  Lab 09/12/21 1459  WBC 4.7    Liver Function Tests: Recent Labs  Lab 09/12/21 1459  AST 19  ALT 17  ALKPHOS 82  BILITOT 0.7  PROT 6.8  ALBUMIN 4.0   No  results for input(s): LIPASE, AMYLASE in the last 168 hours. No results for input(s): AMMONIA in the last 168 hours.  ABG    Component Value Date/Time   TCO2 29 09/12/2021 1506     Coagulation Profile: Recent Labs  Lab 09/12/21 1459  INR 1.0    Cardiac Enzymes: No results for input(s): CKTOTAL, CKMB, CKMBINDEX, TROPONINI in the last 168 hours.  HbA1C: No results found for: HGBA1C  CBG: Recent Labs  Lab 09/12/21 1457  GLUCAP 130*    Review of Systems:   Unable as patient is intubated and sedated on MV  Past  Medical History:  He,  has a past medical history of Atrial fibrillation ([place]).   Surgical History:  History reviewed. No pertinent surgical history.   Social History:    unable  Family History:  His family history is not on file.   Allergies No Known Allergies   Home Medications  Prior to Admission medications   Not on File     Critical care time: 45 mins     Kennieth Rad, ACNP [facility] 09/12/2021, 5:57 PM  See Amion for pager If no response to pager, please call PCCM consult pager After 7:00 pm call Elink

## 2021-09-12 NOTE — Code Documentation (Signed)
Stroke Response Nurse Documentation Code Documentation  [name] is a 88 y.o. male arriving to [facility]  via [facility] on 5/22 with past medical hx of afib with watchman, SAH. On aspirin 81 mg daily. Code stroke was activated by EMS.   Patient from home where he was LKW at 1400 when eating lunch with his wife. Wife noticed banana pudding falling from the left side of his face and then proceeded to noticed slurred speech and left sided weakness.    Stroke team at the bedside on patient arrival. Labs drawn and patient cleared for CT by EDP. Patient to CT with team. NIHSS 7, see documentation for details and code stroke times. Patient with left hemianopia, left facial droop, left arm weakness, left limb ataxia, left decreased sensation, dysarthria , and   neglect on exam.   The following imaging was completed:  CT Head, CTA head and neck. Patient is not a candidate for IV Thrombolytic due to recent [facility] and GI bleed. Patient is a candidate for IR due to LVO.   Care Plan: Patient taken to IR suite. Consent obtained by wife by Theda Sers MD and Estanislado Pandy MD.   Bedside handoff with IR RN Miquel Dunn.    Candace Cruise K  Stroke Response RN

## 2021-09-12 NOTE — ED Triage Notes (Signed)
Pt arrives via EMS from home where patient was eating lunch began having food come out of his mouth noted by wife as well as left sided facial droop and leaning to the left. On arrival to ED patient continues with left sided lean, left facial droop. CBG 160, 130 on arrival to ED. Bp in field 160/90, no prior hx of stroke or thinners.

## 2021-09-12 NOTE — Anesthesia Preprocedure Evaluation (Addendum)
Anesthesia Evaluation  Patient identified by MRN, date of birth, ID band Patient awake    Reviewed: Allergy & Precautions, NPO status , Patient's Chart, lab work & pertinent test resultsPreop documentation limited or incomplete due to emergent nature of procedure.  Airway Mallampati: II  TM Distance: >3 FB Neck ROM: Full    Dental no notable dental hx.    Pulmonary neg pulmonary ROS,    Pulmonary exam normal breath sounds clear to auscultation       Cardiovascular Normal cardiovascular exam+ dysrhythmias Atrial Fibrillation  Rhythm:Regular Rate:Normal     Neuro/Psych Code stroke, last seen normal about 2h ago- preop pt nonverbal, not shaking head to answer questions, awake and follows some commands CVA negative psych ROS   GI/Hepatic negative GI ROS, Neg liver ROS,   Endo/Other  negative endocrine ROS  Renal/GU negative Renal ROS  negative genitourinary   Musculoskeletal negative musculoskeletal ROS (+)   Abdominal   Peds negative pediatric ROS (+)  Hematology negative hematology ROS (+) hct 39, plt 126   Anesthesia Other Findings   Reproductive/Obstetrics negative OB ROS                           Anesthesia Physical Anesthesia Plan  ASA: 3 and emergent  Anesthesia Plan: General   Post-op Pain Management:    Induction: Intravenous  PONV Risk Score and Plan: Ondansetron and Treatment may vary due to age or medical condition  Airway Management Planned: Oral ETT  Additional Equipment: Arterial line  Intra-op Plan:   Post-operative Plan: Extubation in OR and Possible Post-op intubation/ventilation  Informed Consent:     Only emergency history available  Plan Discussed with: CRNA  Anesthesia Plan Comments: (Code stroke)       Anesthesia Quick Evaluation

## 2021-09-12 NOTE — Progress Notes (Signed)
[place] Progress Note Patient Name: Sanay Belmar DOB: 06/05/1930 MRN: [id number]   Date of Service  09/12/2021  HPI/Events of Note  Patient on vent and maxed on propofol Request for restraints and PRN sedation  eICU Interventions  Bilateral wrist restraints ordered PRN fentanyl and versed ordered for RASS -1     Intervention Category Minor Interventions: Agitation / anxiety - evaluation and management  Lemoine Goyne Rodman [place] 09/12/2021, 8:45 PM

## 2021-09-12 NOTE — Procedures (Signed)
INR. Right common carotid arteriogram.   Right CFA approach.    Findings.   Occluded M2 M3 region of the inferior division of the right middle cerebral artery.. Status post complete revascularization of occluded right MCA inferior division M2 M3 region with 1 pass with a 4 x 40 mm Solitaire X retrieval device and contact aspiration achieving aTICI revascularization.  Post CT brain  x2 demonstrating moderate amount of contrast plus blood in the right perisylvian and the right temporal parietal subarachnoid regions. 8 French Angio-Seal closure device used for hemostasis at the right groin puncture site.  Distal pulses dopplerable with right DP nondopplerable unchanged from prior to procedure Patient left intubated due to the patients medical condition. Arlean Hopping MD

## 2021-09-12 NOTE — ED Provider Notes (Signed)
[facility] EMERGENCY DEPARTMENT Provider Note   CSN: [id number] Arrival date & time: 09/12/21  1455     History  Chief Complaint  Patient presents with   Code Stroke    Elijah Stone is a 88 y.o. male.  HPI  88 year old male presents to the emergency department as a code stroke.  Past medical history of previous subarachnoid stroke, not noted to be on any anticoagulation, watchman in place for history of A-fib on aspirin.  Level 5 caveat for acuity, evaluated as a code stroke with the stroke neurology team at bedside at EMS triage/CT scanner.  Report is that patient's last known normal was while he was eating lunch less than 2 hours ago when he developed left-sided facial droop, started leaning to the left.  Home Medications Prior to Admission medications   Not on File      Allergies    Patient has no known allergies.    Review of Systems   Review of Systems  Unable to perform ROS: Acuity of condition   Physical Exam Updated Vital Signs BP 132/73   Pulse 74   Temp 97.8 F (36.6 C)   Resp 17   Wt 78 kg   SpO2 98%  Physical Exam HENT:     Head: Normocephalic.     Mouth/Throat:     Mouth: Mucous membranes are moist.  Cardiovascular:     Rate and Rhythm: Normal rate.  Musculoskeletal:        General: No deformity.  Neurological:     Mental Status: He is alert. He is disoriented.     Motor: Weakness present.     Comments: Facial droop and left sided weakness    ED Results / Procedures / Treatments   Labs (all labs ordered are listed, but only abnormal results are displayed) Labs Reviewed  CBC - Abnormal; Notable for the following components:      Result Value   RBC 3.62 (*)    Hemoglobin 12.0 (*)    HCT 36.3 (*)    MCV 100.3 (*)    Platelets 126 (*)    All other components within normal limits  COMPREHENSIVE METABOLIC PANEL - Abnormal; Notable for the following components:   Glucose, Bld 139 (*)    All other components within normal  limits  I-STAT CHEM 8, ED - Abnormal; Notable for the following components:   Glucose, Bld 139 (*)    All other components within normal limits  CBG MONITORING, ED - Abnormal; Notable for the following components:   Glucose-Capillary 130 (*)    All other components within normal limits  RESP PANEL BY RT-PCR (FLU A&B, COVID) ARPGX2  PROTIME-INR  APTT  DIFFERENTIAL    EKG None  Radiology CT HEAD CODE STROKE WO CONTRAST  Result Date: 09/12/2021 CLINICAL DATA:  Code stroke.  Neuro deficit, acute, stroke suspected EXAM: CT HEAD WITHOUT CONTRAST TECHNIQUE: Contiguous axial images were obtained from the base of the skull through the vertex without intravenous contrast. RADIATION DOSE REDUCTION: This exam was performed according to the departmental dose-optimization program which includes automated exposure control, adjustment of the mA and/or kV according to patient size and/or use of iterative reconstruction technique. COMPARISON:  CT head July 13, 2020 (without report). FINDINGS: Brain: No evidence of acute large vascular territory infarction, convincing acute hemorrhage, hydrocephalus, extra-axial collection or mass lesion/mass effect. Patchy white matter hypodensities, nonspecific but compatible with chronic microvascular ischemic disease. Cortical hyperdensity associated with the right occipital lobe appears  unchanged relative to July 13, 2020 and therefore is favored to represent cortical calcification from prior insult. Vascular: No hyperdense vessel identified. Calcific intracranial atherosclerosis. Skull: No acute fracture. Sinuses/Orbits: Partially imaged right inferior maxillary sinus mucosal thickening and mild ethmoid air cell mucosal thickening. No acute overall findings. Other: No mastoid effusions. ASPECTS [facility] Stroke Program Early CT Score) Total score (0-10 with 10 being normal): 10. IMPRESSION: 1. No evidence of acute intracranial abnormality.   ASPECTS is 10. 2. Cortical  hyperdensity associated with the right occipital lobe appears unchanged relative to July 13, 2020 and therefore is favored to represent cortical calcification from prior insult (over subarachnoid hemorrhage). Findings discussed with Dr. Theda Sers via telephone at 3:13 p.m. Electronically Signed   By: Margaretha Sheffield M.D.   On: 09/12/2021 15:16   CT ANGIO HEAD NECK W WO CM (CODE STROKE)  Result Date: 09/12/2021 CLINICAL DATA:  Neuro deficit, acute, stroke suspected EXAM: CT ANGIOGRAPHY HEAD AND NECK TECHNIQUE: Multidetector CT imaging of the head and neck was performed using the standard protocol during bolus administration of intravenous contrast. Multiplanar CT image reconstructions and MIPs were obtained to evaluate the vascular anatomy. Carotid stenosis measurements (when applicable) are obtained utilizing NASCET criteria, using the distal internal carotid diameter as the denominator. RADIATION DOSE REDUCTION: This exam was performed according to the departmental dose-optimization program which includes automated exposure control, adjustment of the mA and/or kV according to patient size and/or use of iterative reconstruction technique. CONTRAST:  56m OMNIPAQUE IOHEXOL 350 MG/ML SOLN COMPARISON:  None Available. FINDINGS: CTA NECK FINDINGS Aortic arch: Great vessel origins are patent without significant stenosis. Right carotid system: No evidence of dissection, stenosis (50% or greater) or occlusion. Left carotid system: No evidence of dissection, stenosis (50% or greater) or occlusion. Vertebral arteries: Right dominant. No evidence of dissection, stenosis (50% or greater) or occlusion. Skeleton: Moderate multilevel degenerative change. Other neck: No acute abnormality. Upper chest: Biapical pleuroparenchymal scarring and probable fibrosis, better characterized on prior CT chest. Review of the MIP images confirms the above findings CTA HEAD FINDINGS Anterior circulation: Mild calcific atherosclerosis of  bilateral intracranial ICAs with associated mild narrowing. Right M1 MCA is patent. Occlusion of a mid right M2 MCA vessel. Bilateral ACAs are patent. Left MCA is patent. Mild to moderate proximal left M2 MCA stenosis. Posterior circulation: Bilateral intradural vertebral arteries, basilar artery, and both posterior cerebral arteries are patent. Moderate proximal left P2 PCA stenosis. Venous sinuses: As permitted by contrast timing, patent. Review of the MIP images confirms the above findings IMPRESSION: 1. Mid right M2 MCA occlusion within the sylvian fissure. 2. Moderate proximal left P2 PCA stenosis. 3. Mild-to-moderate left M2 MCA stenosis. Findings discussed with Dr. CTheda Sersvia telephone at 3:25 p.m. Electronically Signed   By: FMargaretha SheffieldM.D.   On: 09/12/2021 15:27    Procedures .Critical Care Performed by: HLorelle Gibbs DO Authorized by: HLorelle Gibbs DO   Critical care provider statement:    Critical care time (minutes):  30   Critical care was necessary to treat or prevent imminent or life-threatening deterioration of the following conditions:  CNS failure or compromise   Critical care was time spent personally by me on the following activities:  Development of treatment plan with patient or surrogate, discussions with consultants, evaluation of patient's response to treatment, examination of patient, ordering and review of laboratory studies, ordering and review of radiographic studies, ordering and performing treatments and interventions, pulse oximetry, re-evaluation of patient's condition and review  of old charts   I assumed direction of critical care for this patient from another provider in my specialty: no     Care discussed with: admitting provider      Medications Ordered in ED Medications  sodium chloride flush (NS) 0.9 % injection 3 mL (has no administration in time range)  iohexol (OMNIPAQUE) 350 MG/ML injection 100 mL (70 mLs Intravenous Contrast Given  09/12/21 1514)    ED Course/ Medical Decision Making/ A&P                           Medical Decision Making Amount and/or Complexity of Data Reviewed Labs: ordered. Radiology: ordered.   88 year old male presents emergency department as a code stroke with new left-sided facial droop/left-sided weakness.  History of previous SAH, not on anticoagulation, history of A-fib on aspirin.  Level 5 caveat secondary to acuity, patient evaluated with neuro stroke team at bedside. EKG aifb which is known, VSS. CT of the head without shows old findings however CTA does show a perfusion deficit in the right M2.  Patient is a IR candidate, code IR was placed and patient will be taken for procedure/admission.  Patients evaluation and results requires admission for further treatment and care.             Final Clinical Impression(s) / ED Diagnoses Final diagnoses:  None    Rx / DC Orders ED Discharge Orders     None         Lorelle Gibbs, DO 09/12/21 1607

## 2021-09-12 NOTE — Anesthesia Procedure Notes (Signed)
Arterial Line Insertion Start/End5/22/2023 4:08 AM, 09/12/2021 4:08 AM Performed by: Fredrich Birks, CRNA, CRNA  Patient location: OOR procedure area. Preanesthetic checklist: patient identified, IV checked, site marked, risks and benefits discussed, surgical consent, monitors and equipment checked, pre-op evaluation, timeout performed and anesthesia consent Emergency situation Lidocaine 1% used for infiltration Left, radial was placed Catheter size: 20 G Hand hygiene performed  and maximum sterile barriers used   Attempts: 1 Procedure performed without using ultrasound guided technique. Following insertion, dressing applied. Post procedure assessment: normal and unchanged  Patient tolerated the procedure well with no immediate complications.

## 2021-09-12 NOTE — Sedation Documentation (Signed)
Pt transported to 4N26 with 2 RN's, CRNA and RT. Pt transported on ventilator and monitor. Right femoral puncture site assessed in ICU with primary RN. No hematoma or bleeding noted. Pulses assessed, please see flow sheet for more information. All primary RN's questions were answered prior to this RN's departure.

## 2021-09-13 ENCOUNTER — Inpatient Hospital Stay (HOSPITAL_COMMUNITY): Payer: Medicare Other

## 2021-09-13 ENCOUNTER — Encounter (HOSPITAL_COMMUNITY): Payer: Self-pay | Admitting: Radiology

## 2021-09-13 DIAGNOSIS — I482 Chronic atrial fibrillation, unspecified: Secondary | ICD-10-CM

## 2021-09-13 DIAGNOSIS — J9589 Other postprocedural complications and disorders of respiratory system, not elsewhere classified: Secondary | ICD-10-CM | POA: Diagnosis not present

## 2021-09-13 DIAGNOSIS — I4891 Unspecified atrial fibrillation: Secondary | ICD-10-CM

## 2021-09-13 DIAGNOSIS — I361 Nonrheumatic tricuspid (valve) insufficiency: Secondary | ICD-10-CM

## 2021-09-13 DIAGNOSIS — I6389 Other cerebral infarction: Secondary | ICD-10-CM | POA: Diagnosis not present

## 2021-09-13 DIAGNOSIS — I340 Nonrheumatic mitral (valve) insufficiency: Secondary | ICD-10-CM

## 2021-09-13 DIAGNOSIS — I6601 Occlusion and stenosis of right middle cerebral artery: Secondary | ICD-10-CM

## 2021-09-13 DIAGNOSIS — I63311 Cerebral infarction due to thrombosis of right middle cerebral artery: Secondary | ICD-10-CM | POA: Diagnosis not present

## 2021-09-13 LAB — ECHOCARDIOGRAM COMPLETE
AR max vel: 2.65 cm2
AV Peak grad: 6.9 mmHg
Ao pk vel: 1.31 m/s
Area-P 1/2: 4.46 cm2
Calc EF: 34.8 %
Height: 75 in
S' Lateral: 4.6 cm
Single Plane A2C EF: 37.8 %
Single Plane A4C EF: 33.3 %
Weight: 2751.34 oz

## 2021-09-13 LAB — CBC WITH DIFFERENTIAL/PLATELET
Abs Immature Granulocytes: 0.04 10*3/uL (ref 0.00–0.07)
Basophils Absolute: 0 10*3/uL (ref 0.0–0.1)
Basophils Relative: 0 %
Eosinophils Absolute: 0 10*3/uL (ref 0.0–0.5)
Eosinophils Relative: 0 %
HCT: 32.6 % — ABNORMAL LOW (ref 39.0–52.0)
Hemoglobin: 10.8 g/dL — ABNORMAL LOW (ref 13.0–17.0)
Immature Granulocytes: 1 %
Lymphocytes Relative: 9 %
Lymphs Abs: 0.6 10*3/uL — ABNORMAL LOW (ref 0.7–4.0)
MCH: 32.4 pg (ref 26.0–34.0)
MCHC: 33.1 g/dL (ref 30.0–36.0)
MCV: 97.9 fL (ref 80.0–100.0)
Monocytes Absolute: 0.3 10*3/uL (ref 0.1–1.0)
Monocytes Relative: 4 %
Neutro Abs: 6.3 10*3/uL (ref 1.7–7.7)
Neutrophils Relative %: 86 %
Platelets: 118 10*3/uL — ABNORMAL LOW (ref 150–400)
RBC: 3.33 MIL/uL — ABNORMAL LOW (ref 4.22–5.81)
RDW: 13.8 % (ref 11.5–15.5)
WBC: 7.3 10*3/uL (ref 4.0–10.5)
nRBC: 0 % (ref 0.0–0.2)

## 2021-09-13 LAB — COMPREHENSIVE METABOLIC PANEL
ALT: 22 U/L (ref 0–44)
AST: 29 U/L (ref 15–41)
Albumin: 3.3 g/dL — ABNORMAL LOW (ref 3.5–5.0)
Alkaline Phosphatase: 77 U/L (ref 38–126)
Anion gap: 7 (ref 5–15)
BUN: 17 mg/dL (ref 8–23)
CO2: 22 mmol/L (ref 22–32)
Calcium: 8.8 mg/dL — ABNORMAL LOW (ref 8.9–10.3)
Chloride: 111 mmol/L (ref 98–111)
Creatinine, Ser: 1.09 mg/dL (ref 0.61–1.24)
GFR, Estimated: >60 mL/min (ref >60)
Glucose, Bld: 149 mg/dL — ABNORMAL HIGH (ref 70–99)
Potassium: 4.3 mmol/L (ref 3.5–5.1)
Sodium: 140 mmol/L (ref 135–145)
Total Bilirubin: 0.6 mg/dL (ref 0.3–1.2)
Total Protein: 6.1 g/dL — ABNORMAL LOW (ref 6.5–8.1)

## 2021-09-13 LAB — GLUCOSE, CAPILLARY
Glucose-Capillary: 107 mg/dL — ABNORMAL HIGH (ref 70–99)
Glucose-Capillary: 112 mg/dL — ABNORMAL HIGH (ref 70–99)
Glucose-Capillary: 120 mg/dL — ABNORMAL HIGH (ref 70–99)
Glucose-Capillary: 135 mg/dL — ABNORMAL HIGH (ref 70–99)
Glucose-Capillary: 141 mg/dL — ABNORMAL HIGH (ref 70–99)
Glucose-Capillary: 169 mg/dL — ABNORMAL HIGH (ref 70–99)
Glucose-Capillary: 176 mg/dL — ABNORMAL HIGH (ref 70–99)

## 2021-09-13 LAB — TSH: TSH: 0.212 u[IU]/mL — ABNORMAL LOW (ref 0.350–4.500)

## 2021-09-13 LAB — TRIGLYCERIDES: Triglycerides: 37 mg/dL (ref <150)

## 2021-09-13 MED ORDER — DEXMEDETOMIDINE HCL IN NACL 400 MCG/100ML IV SOLN
0.0000 ug/kg/h | INTRAVENOUS | Status: DC
  Administered 2021-09-13: 0.2 ug/kg/h via INTRAVENOUS

## 2021-09-13 MED ORDER — CHLORHEXIDINE GLUCONATE CLOTH 2 % EX PADS
6.0000 | MEDICATED_PAD | Freq: Every day | CUTANEOUS | Status: DC
  Administered 2021-09-13 – 2021-09-14 (×2): 6 via TOPICAL

## 2021-09-13 MED ORDER — ORAL CARE MOUTH RINSE
15.0000 mL | Freq: Two times a day (BID) | OROMUCOSAL | Status: DC
  Administered 2021-09-13 – 2021-09-15 (×4): 15 mL via OROMUCOSAL

## 2021-09-13 MED ORDER — POLYVINYL ALCOHOL 1.4 % OP SOLN
1.0000 [drp] | Freq: Two times a day (BID) | OPHTHALMIC | Status: DC
  Administered 2021-09-13 – 2021-09-14 (×4): 1 [drp] via OPHTHALMIC
  Filled 2021-09-13: qty 15

## 2021-09-13 MED ORDER — LEVOTHYROXINE SODIUM 88 MCG PO TABS
88.0000 ug | ORAL_TABLET | Freq: Every day | ORAL | Status: DC
  Administered 2021-09-14 – 2021-09-15 (×2): 88 ug via ORAL
  Filled 2021-09-13 (×2): qty 1

## 2021-09-13 MED ORDER — ROSUVASTATIN CALCIUM 20 MG PO TABS
20.0000 mg | ORAL_TABLET | Freq: Every day | ORAL | Status: DC
  Administered 2021-09-14: 20 mg
  Filled 2021-09-13: qty 1

## 2021-09-13 MED ORDER — PERFLUTREN LIPID MICROSPHERE
1.0000 mL | INTRAVENOUS | Status: AC | PRN
  Administered 2021-09-13: 2 mL via INTRAVENOUS

## 2021-09-13 MED ORDER — LATANOPROST 0.005 % OP SOLN
1.0000 [drp] | Freq: Every day | OPHTHALMIC | Status: DC
  Administered 2021-09-13 – 2021-09-14 (×2): 1 [drp] via OPHTHALMIC
  Filled 2021-09-13: qty 2.5

## 2021-09-13 MED ORDER — LEVOTHYROXINE SODIUM 88 MCG PO TABS: 88.0000 ug | ORAL_TABLET | Freq: Every day | ORAL | Status: DC

## 2021-09-13 MED ORDER — ROSUVASTATIN CALCIUM 20 MG PO TABS: 20.0000 mg | ORAL_TABLET | Freq: Every day | ORAL | Status: DC

## 2021-09-13 MED ORDER — SODIUM CHLORIDE 0.9 % IV SOLN: INTRAVENOUS | Status: DC

## 2021-09-13 MED ORDER — DEXMEDETOMIDINE HCL IN NACL 400 MCG/100ML IV SOLN
INTRAVENOUS | Status: AC
  Filled 2021-09-13: qty 100

## 2021-09-13 NOTE — TOC CAGE-AID Note (Signed)
Transition of Care [facility]) - CAGE-AID Screening   Patient Details  Name: Elijah Stone MRN: [id number] Date of Birth: [birth year]  Transition of Care [facility]) CM/SW Contact:    Karalynn Cottone C Tarpley-Carter, [place] Phone Number: 09/13/2021, 2:17 PM   Clinical Narrative: Pt is unable to participate in Cage Aid. Pt is currently intubated and sedated.  CSW will assess at a better time.  Wanna Gully Tarpley-Carter, MSW, LCSW-A Pronouns:  She/Her/Hers [facility] Clinical Social Worker Direct Number:  [phone] [email or web address]  CAGE-AID Screening: Substance Abuse Screening unable to be completed due to: : Patient unable to participate

## 2021-09-13 NOTE — Progress Notes (Signed)
PT Cancellation Note  Patient Details Name: Elijah Stone MRN: [id number] DOB: 1931/02/06   Cancelled Treatment:    Reason Eval/Treat Not Completed: Patient at procedure or test/unavailable;Patient not medically ready.    Shary Decamp [facility] 09/13/2021, 2:28 PM [place] Office [phone]

## 2021-09-13 NOTE — Progress Notes (Signed)
  Echocardiogram Echocardiogram Transesophageal has been performed.  Elijah Stone 09/13/2021, 2:23 PM

## 2021-09-13 NOTE — Progress Notes (Signed)
Referring Physician(s): CODE STROKE  Supervising Physician: Luanne Bras  Patient Status:  [facility] - In-pt  Chief Complaint: Right MCA stroke s/p mechanical thrombectomy with Dr. Estanislado Pandy 09/12/21  Subjective: Intubated by breathing trial is underway. He is moving all extremities and following most commands.   Allergies: Patient has no known allergies.  Medications: Prior to Admission medications   Not on File     Vital Signs: BP 127/76   Pulse 75   Temp (!) 96.8 F (36 C) (Axillary) Comment: Reported to the nurse,warm blankets applied  Resp 16   Ht '6\' 3"'$  (1.905 m)   Wt 171 lb 15.3 oz (78 kg)   SpO2 100%   BMI 21.49 kg/m   Physical Exam Constitutional:      General: He is not in acute distress.    Appearance: He is not ill-appearing.     Comments: Intubated; precedex. Eyes intermittently open. Able to follow most commands.   Cardiovascular:     Rate and Rhythm: Normal rate and regular rhythm.     Comments: Right groin vascular site is clean, soft and dry.  Pulmonary:     Comments: Intubated Neurological:     Comments: Moves all extremities. Able to follow most commands - open eyes, raise right arm, move legs.     Imaging: MR BRAIN WO CONTRAST  Result Date: 09/13/2021 CLINICAL DATA:  New onset left-sided weakness, left facial droop, dysarthria, and neglect EXAM: MRI HEAD WITHOUT CONTRAST TECHNIQUE: Multiplanar, multiecho pulse sequences of the brain and surrounding structures were obtained without intravenous contrast. COMPARISON:  None Available. FINDINGS: Brain: Restricted diffusion with ADC correlates in the right MCA distribution, most prominently involving the insula insula and right temporal lobe (series 5, image 89-91), with smaller areas of restricted diffusion in the right frontal (series 7, image 71) and right parietal lobes (series 5, images 97-100 and series 7, images 57-58). Petechial hemorrhage is associated with the right insular, right temporal,  and right parietal areas of infarction, with additional subarachnoid hemorrhage along the right temporal and frontoparietal lobes. No mass, mass effect, or midline shift. Trace susceptibility artifact is seen layering in the occipital horns of the lateral ventricles (series 14, image 25), likely hemorrhage, which is technically age indeterminate. No hydrocephalus. Scattered punctate foci of susceptibility in the bilateral cerebral hemispheres, likely represent sequela of prior microhemorrhages, with larger foci of hemosiderin deposition in the left parietal and right occipital lobes likely the sequela of small hemorrhagic infarcts. Vascular: Normal arterial flow voids. Skull and upper cervical spine: Normal marrow signal. Sinuses/Orbits: Mucosal thickening in the right maxillary sinus and ethmoid air cells. Status post bilateral lens replacements. Other: The mastoids are well aerated. IMPRESSION: 1. Acute infarcts in the right insula and right temporal lobe, with smaller areas of infarction in the right frontal and parietal cortex, consistent with the prior right MCA occlusion. The areas of infarction in the right insula, right temporal lobe and right parietal lobe demonstrate petechial hemorrhage. No mass effect or midline shift. 2. Subarachnoid hemorrhage along the right temporal and frontoparietal regions. 3. Trace susceptibility artifact in the occipital horns of the lateral ventricles, likely hemorrhage, but of unknown acuity. No hydrocephalus. These results will be called to the ordering clinician or representative by the Radiologist Assistant, and communication documented in the PACS or [employer]. Electronically Signed   By: Merilyn Baba M.D.   On: 09/13/2021 00:39   DG Chest Port 1 View  Result Date: 09/12/2021 CLINICAL DATA:  Status post  intubation. EXAM: PORTABLE CHEST 1 VIEW COMPARISON:  03/29/2021 FINDINGS: ET tube tip is in satisfactory position approximately 4 cm above the carina. Enteric  tube tip and side port are in the left upper quadrant of the abdomen below the GE junction. Stable cardiomediastinal contours. Biapical pleuroparenchymal scarring is again identified and appears similar to previous exam. Chronic diffuse interstitial reticulation within upper lung zone predominance is again noted. No superimposed airspace consolidation, pleural effusion or edema. IMPRESSION: 1. Satisfactory position of the ET tube with tip above the carina. 2. Enteric tube tip and side port are below the level of the hemidiaphragms. 3. Chronic interstitial changes within both lungs, similar to previous exam. Electronically Signed   By: Kerby Moors M.D.   On: 09/12/2021 18:41   CT HEAD CODE STROKE WO CONTRAST  Result Date: 09/12/2021 CLINICAL DATA:  Code stroke.  Neuro deficit, acute, stroke suspected EXAM: CT HEAD WITHOUT CONTRAST TECHNIQUE: Contiguous axial images were obtained from the base of the skull through the vertex without intravenous contrast. RADIATION DOSE REDUCTION: This exam was performed according to the departmental dose-optimization program which includes automated exposure control, adjustment of the mA and/or kV according to patient size and/or use of iterative reconstruction technique. COMPARISON:  CT head July 13, 2020 (without report). FINDINGS: Brain: No evidence of acute large vascular territory infarction, convincing acute hemorrhage, hydrocephalus, extra-axial collection or mass lesion/mass effect. Patchy white matter hypodensities, nonspecific but compatible with chronic microvascular ischemic disease. Cortical hyperdensity associated with the right occipital lobe appears unchanged relative to July 13, 2020 and therefore is favored to represent cortical calcification from prior insult. Vascular: No hyperdense vessel identified. Calcific intracranial atherosclerosis. Skull: No acute fracture. Sinuses/Orbits: Partially imaged right inferior maxillary sinus mucosal thickening and mild  ethmoid air cell mucosal thickening. No acute overall findings. Other: No mastoid effusions. ASPECTS [facility] Stroke Program Early CT Score) Total score (0-10 with 10 being normal): 10. IMPRESSION: 1. No evidence of acute intracranial abnormality.   ASPECTS is 10. 2. Cortical hyperdensity associated with the right occipital lobe appears unchanged relative to July 13, 2020 and therefore is favored to represent cortical calcification from prior insult (over subarachnoid hemorrhage). Findings discussed with Dr. Theda Sers via telephone at 3:13 p.m. Electronically Signed   By: Margaretha Sheffield M.D.   On: 09/12/2021 15:16   CT ANGIO HEAD NECK W WO CM (CODE STROKE)  Result Date: 09/12/2021 CLINICAL DATA:  Neuro deficit, acute, stroke suspected EXAM: CT ANGIOGRAPHY HEAD AND NECK TECHNIQUE: Multidetector CT imaging of the head and neck was performed using the standard protocol during bolus administration of intravenous contrast. Multiplanar CT image reconstructions and MIPs were obtained to evaluate the vascular anatomy. Carotid stenosis measurements (when applicable) are obtained utilizing NASCET criteria, using the distal internal carotid diameter as the denominator. RADIATION DOSE REDUCTION: This exam was performed according to the departmental dose-optimization program which includes automated exposure control, adjustment of the mA and/or kV according to patient size and/or use of iterative reconstruction technique. CONTRAST:  25m OMNIPAQUE IOHEXOL 350 MG/ML SOLN COMPARISON:  None Available. FINDINGS: CTA NECK FINDINGS Aortic arch: Great vessel origins are patent without significant stenosis. Right carotid system: No evidence of dissection, stenosis (50% or greater) or occlusion. Left carotid system: No evidence of dissection, stenosis (50% or greater) or occlusion. Vertebral arteries: Right dominant. No evidence of dissection, stenosis (50% or greater) or occlusion. Skeleton: Moderate multilevel degenerative  change. Other neck: No acute abnormality. Upper chest: Biapical pleuroparenchymal scarring and probable fibrosis, better characterized on  prior CT chest. Review of the MIP images confirms the above findings CTA HEAD FINDINGS Anterior circulation: Mild calcific atherosclerosis of bilateral intracranial ICAs with associated mild narrowing. Right M1 MCA is patent. Occlusion of a mid right M2 MCA vessel. Bilateral ACAs are patent. Left MCA is patent. Mild to moderate proximal left M2 MCA stenosis. Posterior circulation: Bilateral intradural vertebral arteries, basilar artery, and both posterior cerebral arteries are patent. Moderate proximal left P2 PCA stenosis. Venous sinuses: As permitted by contrast timing, patent. Review of the MIP images confirms the above findings IMPRESSION: 1. Mid right M2 MCA occlusion within the sylvian fissure. 2. Moderate proximal left P2 PCA stenosis. 3. Mild-to-moderate left M2 MCA stenosis. Findings discussed with Dr. Theda Sers via telephone at 3:25 p.m. Electronically Signed   By: Margaretha Sheffield M.D.   On: 09/12/2021 15:27    Labs:  CBC: Recent Labs    09/12/21 1459 09/12/21 1506 09/12/21 1744 09/13/21 0540  WBC 4.7  --   --  7.3  HGB 12.0* 13.3 10.5* 10.8*  HCT 36.3* 39.0 31.0* 32.6*  PLT 126*  --   --  118*    COAGS: Recent Labs    09/12/21 1459  INR 1.0  APTT 29    BMP: Recent Labs    09/12/21 1459 09/12/21 1506 09/12/21 1744 09/13/21 0540  NA 137 140 141 140  K 3.9 4.1 4.2 4.3  CL 104 102  --  111  CO2 26  --   --  22  GLUCOSE 139* 139*  --  149*  BUN 16 20  --  17  CALCIUM 9.2  --   --  8.8*  CREATININE 1.07 1.00  --  1.09  GFRNONAA >60  --   --  >60    LIVER FUNCTION TESTS: Recent Labs    09/12/21 1459 09/13/21 0540  BILITOT 0.7 0.6  AST 19 29  ALT 17 22  ALKPHOS 82 77  PROT 6.8 6.1*  ALBUMIN 4.0 3.3*    Assessment and Plan:  Right MCA stroke s/p mechanical thrombectomy with Dr. Estanislado Pandy 09/12/21  Patient remains  intubated but is currently undergoing a breathing trial. Precedex and cleveprex infusions are running. Right groin vascular site is clean, soft and dry. Patient able to move all extremities and follow most commands. Post-intervention imaging showed findings consistent with the prior right MCA occlusion and a small SAH along the right temporal and frontoparietal regions.   Patient to remain intubated until after TEE is complete. IR will continue to follow.   Electronically Signed: Soyla Dryer, AGACNP-BC [phone] 09/13/2021, 10:49 AM   I spent a total of 15 Minutes at the the patient's bedside AND on the patient's hospital floor or unit, greater than 50% of which was counseling/coordinating care for right MCA stroke s/p thrombectomy

## 2021-09-13 NOTE — Consult Note (Addendum)
Cardiology Consultation:   Patient ID: Elijah Stone MRN: [id number]; DOB: 12-13-1930  Admit date: 09/12/2021 Date of Consult: 09/13/2021  PCP:  Merryl Hacker No   [facility] Providers Cardiologist:  Dr. Maylene Roes, [facility])   Patient Profile:   Elijah Stone is a 88 y.o. 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 Leonie Man.  History of Present Illness:   Elijah Stone saw Dr. Maylene Roes 09/08/21, described as permanent AFib s/p watchman procedure, NICM, HTN. This was his 1st visit there to get established, previously followed by a Dr. Ferdinand Lango. On ASA alone for antiplatelet tx, discussed BP a limiting factor for GDMT.  Indication for watchman was 2/2 prior intracranial bleeding on [place].  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   He was admitted to [facility] yesterday with L facial droop, LUE weakness and dysarthria, fluctuating initially. TPA not given with hx of prior hemorrhage history, CTA showed a right M2 occlusion, code IR activated He underwent thrombectomy with complete revascularization of occluded right MCA inferior division M2 M3 region with 1 pass with a 4 x 40 mm Solitaire X retrieval device and contact aspiration achieving aTICI revascularization.  Post CT brain  x2 demonstrating moderate amount of contrast plus blood in the right perisylvian and the right temporal parietal subarachnoid regions.  Neurology has requested EP to come to the case for thoughts/recommendations for anticoagulation going forward as well as imaging recommended +/- TEE to evaluate the watchman vs TTE if felt adequate.  In review of their note, pt  remains intubated though tolerating breathing tral, though planned to keep intubated for TEE procedure  LABS K+ 4.3 LFTs OK BUN/Creat 17/1.09 WBC 3.33 H/H 12/36 > 10.8/32 Plts 118 TSH 0.212   He is intubated, woke during exam, moving all extremities Wife is bedside reports watchman placed after "brain bleed" on warfarin, recalls the INR not being high, but "in range". He was on ASA alone now for years. 2021 he had a mechanical fall, hit his head and did have a traumatic brain bleed then, with perhaps some disagreement at the time by his outpt neurologist at the time.  Past Medical History:  Diagnosis Date   Atrial fibrillation [facility])     Past Surgical History:  Procedure Laterality Date   RADIOLOGY WITH ANESTHESIA N/A 09/12/2021   Procedure: IR WITH ANESTHESIA;  Surgeon: Radiologist, Medication, MD;  Location: [place];  Service: Radiology;  Laterality: N/A;     Home Medications:  Prior to Admission medications   Not on File    Inpatient Medications: Scheduled Meds:  chlorhexidine gluconate (MEDLINE KIT)  15 mL Mouth Rinse BID   docusate  100 mg Per Tube BID   mouth rinse  15 mL Mouth Rinse 10 times per day   pantoprazole sodium  40 mg Per Tube Daily   polyethylene glycol  17 g Per Tube Daily   sodium chloride flush  3 mL Intravenous Once   Continuous Infusions:  clevidipine 4 mg/hr (09/13/21 0640)   dexmedetomidine (PRECEDEX) IV infusion 0.2 mcg/kg/hr (09/13/21 0950)   propofol (DIPRIVAN) infusion 50 mcg/kg/min (09/13/21 0600)   PRN Meds: acetaminophen **OR** acetaminophen (TYLENOL) oral liquid 160 mg/5  mL **OR** acetaminophen, fentaNYL (SUBLIMAZE) injection, fentaNYL (SUBLIMAZE) injection, perflutren lipid microspheres (DEFINITY) IV suspension  Allergies:   No Known Allergies  Social History:   Social History   Socioeconomic History   Marital status: Married    Spouse name: Not on file   Number of children: Not on file   Years of education: Not on file    Highest education level: Not on file  Occupational History   Not on file  Tobacco Use   Smoking status: Not on file   Smokeless tobacco: Not on file  Substance and Sexual Activity   Alcohol use: Not on file   Drug use: Not on file   Sexual activity: Not on file  Other Topics Concern   Not on file  Social History Narrative   Not on file   Social Determinants of Health   Financial Resource Strain: Not on file  Food Insecurity: Not on file  Transportation Needs: Not on file  Physical Activity: Not on file  Stress: Not on file  Social Connections: Not on file  Intimate Partner Violence: Not on file    Family History:   Sister had heart disease   ROS:  Please see the history of present illness.  All other ROS reviewed and negative.     Physical Exam/Data:   Vitals:   09/13/21 0830 09/13/21 0900 09/13/21 0915 09/13/21 1000  BP: 114/71 130/79 124/69 100/61  Pulse: 99 89 89 79  Resp: 20 (!) _0 Temp:      TempSrc:      SpO2: 100% 92% 100% 100%  Weight:      Height:        Intake/Output Summary (Last 24 hours) at 09/13/2021 1047 Last data filed at 09/13/2021 0600 Gross per 24 hour  Intake 1916.74 ml  Output 950 ml  Net 966.74 ml      09/12/2021    3:00 PM  Last 3 Weights  Weight (lbs) 171 lb 15.3 oz  Weight (kg) 78 kg     Body mass index is 21.49 kg/m.  General:  Well nourished, well developed, in no acute distress HEENT: normal Neck: no JVD Vascular: No carotid bruits; Distal pulses 2+ bilaterally Cardiac:  irreg-irreg; no murmurs, gallops or rubs Lungs:  clear to auscultation bilaterally, no wheezing, rhonchi or rales  Abd: soft, nontender, no hepatomegaly  Ext: no edema Musculoskeletal:  No deformities Skin: warm and dry  Neuro:  wakes, unable to assess well Psych:  unable to assess   EKG:  The EKG was personally reviewed and demonstrates:    AFib 73bpm, PVCs, LAD   Telemetry:  Telemetry was personally reviewed and demonstrates:  AFib, rate  controlled  Relevant CV Studies:  09/13/21: TTE 1. Left ventricular ejection fraction, by estimation, is 30 to 35%. The  left ventricle has moderately decreased function. The left ventricle  demonstrates global hypokinesis. Left ventricular diastolic parameters are  consistent with Grade III diastolic  dysfunction (restrictive).   2. Right ventricular systolic function is normal. The right ventricular  size is normal.   3. Left atrial size was severely dilated.   4. Right atrial size was severely dilated.   5. The mitral valve is degenerative. Mild to at most mild-moderate mitral  valve regurgitation. Atrial functional in nature, blunting of pulmonary  vein signal; evidence of increase left atrial pressure.   6. Tricuspid valve regurgitation is mild to moderate and eccentric.   7. The aortic valve is tricuspid. Aortic valve  regurgitation is not  visualized. No aortic stenosis is present.   8. The inferior vena cava is normal in size with <50% respiratory  variability, suggesting right atrial pressure of 8 mmHg, thought patient  is on ventilator.    04/29/18: TTE SUMMARY The left ventricle is mildly dilated. There is normal left ventricular wall thickness. Left ventricular systolic function is moderately reduced. LV ejection fraction = 30-35%. There is moderate global hypokinesis of the left ventricle. The right ventricle is mildly dilated. The right ventricular systolic function is mildly reduced. The left atrial volume is severely increased. The right atrium is severely dilated. There is no significant valvular stenosis or regurgitation. There is mild tricuspid regurgitation. There is no pericardial effusion. The IVC is normal in size with an inspiratory collapse of greater then 50%,  suggesting normal right atrial pressure. There is no comparison study available.  03/30/21: TTE SUMMARY Left ventricular systolic function is moderately reduced. LV ejection fraction =  35-40%. There is moderate global hypokinesis of the left ventricle. The left ventricle is mildly dilated. The right ventricle is moderately dilated. The right ventricular systolic function is mild to moderately reduced. The left atrium is moderately dilated. The right atrium is moderately dilated. There is mild tricuspid regurgitation. The IVC is normal in size with an inspiratory collapse of greater then 50%, suggesting normal right atrial pressure. There is no pericardial effusion. There is no significant change in comparison with the last study.  CARDIAC CATHETERIZATION/PCI: LCP around 10+ years ago in [place], [place], no blockages, per patient.   Laboratory Data:  High Sensitivity Troponin:  No results for input(s): TROPONINIHS in the last 720 hours.   Chemistry Recent Labs  Lab 09/12/21 1459 09/12/21 1506 09/12/21 1744 09/13/21 0540  NA 137 140 141 140  K 3.9 4.1 4.2 4.3  CL 104 102  --  111  CO2 26  --   --  22  GLUCOSE 139* 139*  --  149*  BUN 16 20  --  17  CREATININE 1.07 1.00  --  1.09  CALCIUM 9.2  --   --  8.8*  GFRNONAA >60  --   --  >60  ANIONGAP 7  --   --  7    Recent Labs  Lab 09/12/21 1459 09/13/21 0540  PROT 6.8 6.1*  ALBUMIN 4.0 3.3*  AST 19 29  ALT 17 22  ALKPHOS 82 77  BILITOT 0.7 0.6   Lipids  Recent Labs  Lab 09/12/21 1745 09/13/21 0540  CHOL 175  --   TRIG 63 37  HDL 59  --   LDLCALC 103*  --   CHOLHDL 3.0  --     Hematology Recent Labs  Lab 09/12/21 1459 09/12/21 1506 09/12/21 1744 09/13/21 0540  WBC 4.7  --   --  7.3  RBC 3.62*  --   --  3.33*  HGB 12.0* 13.3 10.5* 10.8*  HCT 36.3* 39.0 31.0* 32.6*  MCV 100.3*  --   --  97.9  MCH 33.1  --   --  32.4  MCHC 33.1  --   --  33.1  RDW 13.8  --   --  13.8  PLT 126*  --   --  118*   Thyroid  Recent Labs  Lab 09/13/21 0540  TSH 0.212*    BNPNo results for input(s): BNP, PROBNP in the last 168 hours.  DDimer No results for input(s): DDIMER in the last 168  hours.   Radiology/Studies:  MR BRAIN WO CONTRAST Result Date: 09/13/2021 CLINICAL DATA:  New onset left-sided weakness, left facial droop, dysarthria, and neglect EXAM: MRI HEAD WITHOUT CONTRAST TECHNIQUE: Multiplanar, multiecho pulse sequences of the brain and surrounding structures were obtained without intravenous contrast. COMPARISON:  None Available. FINDINGS: Brain: Restricted diffusion with ADC correlates in the right MCA distribution, most prominently involving the insula insula and right temporal lobe (series 5, image 89-91), with smaller areas of restricted diffusion in the right frontal (series 7, image 71) and right parietal lobes (series 5, images 97-100 and series 7, images 57-58). Petechial hemorrhage is associated with the right insular, right temporal, and right parietal areas of infarction, with additional subarachnoid hemorrhage along the right temporal and frontoparietal lobes. No mass, mass effect, or midline shift. Trace susceptibility artifact is seen layering in the occipital horns of the lateral ventricles (series 14, image 25), likely hemorrhage, which is technically age indeterminate. No hydrocephalus. Scattered punctate foci of susceptibility in the bilateral cerebral hemispheres, likely represent sequela of prior microhemorrhages, with larger foci of hemosiderin deposition in the left parietal and right occipital lobes likely the sequela of small hemorrhagic infarcts. Vascular: Normal arterial flow voids. Skull and upper cervical spine: Normal marrow signal. Sinuses/Orbits: Mucosal thickening in the right maxillary sinus and ethmoid air cells. Status post bilateral lens replacements. Other: The mastoids are well aerated. IMPRESSION: 1. Acute infarcts in the right insula and right temporal lobe, with smaller areas of infarction in the right frontal and parietal cortex, consistent with the prior right MCA occlusion. The areas of infarction in the right insula, right temporal lobe and  right parietal lobe demonstrate petechial hemorrhage. No mass effect or midline shift. 2. Subarachnoid hemorrhage along the right temporal and frontoparietal regions. 3. Trace susceptibility artifact in the occipital horns of the lateral ventricles, likely hemorrhage, but of unknown acuity. No hydrocephalus. These results [place] be called to the ordering clinician or representative by the Radiologist Assistant, and communication documented in the PACS or [employer]. Electronically Signed   By: Merilyn Baba M.D.   On: 09/13/2021 00:39   DG Chest Port 1 View Result Date: 09/12/2021 CLINICAL DATA:  Status post intubation. EXAM: PORTABLE CHEST 1 VIEW COMPARISON:  03/29/2021 FINDINGS: ET tube tip is in satisfactory position approximately 4 cm above the carina. Enteric tube tip and side port are in the left upper quadrant of the abdomen below the GE junction. Stable cardiomediastinal contours. Biapical pleuroparenchymal scarring is again identified and appears similar to previous exam. Chronic diffuse interstitial reticulation within upper lung zone predominance is again noted. No superimposed airspace consolidation, pleural effusion or edema. IMPRESSION: 1. Satisfactory position of the ET tube with tip above the carina. 2. Enteric tube tip and side port are below the level of the hemidiaphragms. 3. Chronic interstitial changes within both lungs, similar to previous exam. Electronically Signed   By: Kerby Moors M.D.   On: 09/12/2021 18:41   CT HEAD CODE STROKE WO CONTRAST Result Date: 09/12/2021 CLINICAL DATA:  Code stroke.  Neuro deficit, acute, stroke suspected EXAM: CT HEAD WITHOUT CONTRAST TECHNIQUE: Contiguous axial images were obtained from the base of the skull through the vertex without intravenous contrast. RADIATION DOSE REDUCTION: This exam was performed according to the departmental dose-optimization program which includes automated exposure control, adjustment of the mA and/or kV according to  patient size and/or use of iterative reconstruction technique. COMPARISON:  CT head July 13, 2020 (without report). FINDINGS: Brain: No evidence of acute large vascular territory infarction, convincing  acute hemorrhage, hydrocephalus, extra-axial collection or mass lesion/mass effect. Patchy white matter hypodensities, nonspecific but compatible with chronic microvascular ischemic disease. Cortical hyperdensity associated with the right occipital lobe appears unchanged relative to July 13, 2020 and therefore is favored to represent cortical calcification from prior insult. Vascular: No hyperdense vessel identified. Calcific intracranial atherosclerosis. Skull: No acute fracture. Sinuses/Orbits: Partially imaged right inferior maxillary sinus mucosal thickening and mild ethmoid air cell mucosal thickening. No acute overall findings. Other: No mastoid effusions. ASPECTS [facility] Stroke Program Early CT Score) Total score (0-10 with 10 being normal): 10. IMPRESSION: 1. No evidence of acute intracranial abnormality.   ASPECTS is 10. 2. Cortical hyperdensity associated with the right occipital lobe appears unchanged relative to July 13, 2020 and therefore is favored to represent cortical calcification from prior insult (over subarachnoid hemorrhage). Findings discussed with Dr. Theda Sers via telephone at 3:13 p.m. Electronically Signed   By: Margaretha Sheffield M.D.   On: 09/12/2021 15:16   CT ANGIO HEAD NECK W WO CM (CODE STROKE) Result Date: 09/12/2021 CLINICAL DATA:  Neuro deficit, acute, stroke suspected EXAM: CT ANGIOGRAPHY HEAD AND NECK TECHNIQUE: Multidetector CT imaging of the head and neck was performed using the standard protocol during bolus administration of intravenous contrast. Multiplanar CT image reconstructions and MIPs were obtained to evaluate the vascular anatomy. Carotid stenosis measurements (when applicable) are obtained utilizing NASCET criteria, using the distal internal carotid diameter as the  denominator. RADIATION DOSE REDUCTION: This exam was performed according to the departmental dose-optimization program which includes automated exposure control, adjustment of the mA and/or kV according to patient size and/or use of iterative reconstruction technique. CONTRAST:  55m OMNIPAQUE IOHEXOL 350 MG/ML SOLN COMPARISON:  None Available. FINDINGS: CTA NECK FINDINGS Aortic arch: Great vessel origins are patent without significant stenosis. Right carotid system: No evidence of dissection, stenosis (50% or greater) or occlusion. Left carotid system: No evidence of dissection, stenosis (50% or greater) or occlusion. Vertebral arteries: Right dominant. No evidence of dissection, stenosis (50% or greater) or occlusion. Skeleton: Moderate multilevel degenerative change. Other neck: No acute abnormality. Upper chest: Biapical pleuroparenchymal scarring and probable fibrosis, better characterized on prior CT chest. Review of the MIP images confirms the above findings CTA HEAD FINDINGS Anterior circulation: Mild calcific atherosclerosis of bilateral intracranial ICAs with associated mild narrowing. Right M1 MCA is patent. Occlusion of a mid right M2 MCA vessel. Bilateral ACAs are patent. Left MCA is patent. Mild to moderate proximal left M2 MCA stenosis. Posterior circulation: Bilateral intradural vertebral arteries, basilar artery, and both posterior cerebral arteries are patent. Moderate proximal left P2 PCA stenosis. Venous sinuses: As permitted by contrast timing, patent. Review of the MIP images confirms the above findings IMPRESSION: 1. Mid right M2 MCA occlusion within the sylvian fissure. 2. Moderate proximal left P2 PCA stenosis. 3. Mild-to-moderate left M2 MCA stenosis. Findings discussed with Dr. CTheda Sersvia telephone at 3:25 p.m. Electronically Signed   By: FMargaretha SheffieldM.D.   On: 09/12/2021 15:27     Assessment and Plan:   AFib is described as permanent CHA2DS2Vasc is 5 now with CVA Underwent  [place] 2[place]2 hx of some kind of intracranial bleeding while on warfarin   Now presents with stroke s/p thrombectomy I discussed with Dr. LQuentin Orevia telephone the case Eliazar Olivar need TEE to evaluate watchman, and any other embolic source I discussed with neurology, hemodynamically stable for procedure Currently sedated/intubated.  I have discussed with the patient's wife and son at bedside  rational for the procedure, potential risks, they are both agreeable. Hutton Pellicane look to try and get done today so he can extubate   D/w Dr. Leonie Man, Lorren Splawn likely need go forward when able with Eliquis.   Dr. Curt Bears has seen the patient   Risk Assessment/Risk Scores:    For questions or updates, please contact [facility] Please consult [email or web address] for contact info under    Signed, Baldwin Jamaica, PA-C  09/13/2021 10:47 AM  I have seen and examined this patient with Tommye Standard.  Agree with above, note added to reflect my findings.  He has a history of atrial fibrillation and is status post watchman due to intracranial hemorrhage.  He was admitted to the hospital with left facial droop, right upper extremity weakness, dysarthria.  He was found to have right M2 occlusion.  He underwent thrombectomy and complete revascularization of an occluded right MCA.  He had a TEE today that showed continue to flow around his Watchman device.  GEN: Well nourished, well developed, in no acute distress  HEENT: normal  Neck: no JVD, carotid bruits, or masses Cardiac: irregular; no murmurs, rubs, or gallops,no edema  Respiratory:  clear to auscultation bilaterally, normal work of breathing GI: soft, nontender, nondistended, + BS MS: no deformity or atrophy  Skin: warm and dry Neuro:  Strength and sensation are intact Psych: euthymic mood, full affect   Permanent atrial fibrillation: Patient is status post watchman.  He has had an intracranial hemorrhage and thus the watchman was placed.   He does have a prior history of stroke, and presented this hospitalization stroke.  Unfortunately he continues to have flow around his Watchman device with incomplete seal.  Due to that, would plan for anticoagulation with Eliquis.  He should follow-up with his primary neurologist and cardiologist to discuss long-term anticoagulation.  Leahann Lempke M. Laiyah Exline MD 09/13/2021 3:53 PM

## 2021-09-13 NOTE — Progress Notes (Signed)
  OT Cancellation Note  Patient Details Name: Elijah Stone MRN: [id number] DOB: 05-21-1930   Cancelled Treatment:    Reason Eval/Treat Not Completed: Medical issues which prohibited therapy (Pt intubated with breathing trial and possible extubation. OT evaluation to f/u after extubation as appropriate)  Alver Leete A Elberta Lachapelle 09/13/2021, 11:57 AM

## 2021-09-13 NOTE — Anesthesia Postprocedure Evaluation (Signed)
Anesthesia Post Note  Patient: Elijah Stone  Procedure(s) Performed: IR WITH ANESTHESIA     Patient location during evaluation: PACU Anesthesia Type: General Level of consciousness: awake and alert, oriented and patient cooperative Pain management: pain level controlled Vital Signs Assessment: post-procedure vital signs reviewed and stable Respiratory status: spontaneous breathing, nonlabored ventilation and respiratory function stable Cardiovascular status: blood pressure returned to baseline and stable Postop Assessment: no apparent nausea or vomiting Anesthetic complications: no   No notable events documented.  Last Vitals:  Vitals:   09/13/21 0600 09/13/21 0700  BP: 134/82 119/79  Pulse: 85 (!) 109  Resp: 14 14  Temp:    SpO2: 100% 100%    Last Pain:  Vitals:   09/13/21 0400  TempSrc: Axillary  PainSc:                  Pervis Hocking

## 2021-09-13 NOTE — Progress Notes (Signed)
RT NOTE:   Pt transported to MRI without event.

## 2021-09-13 NOTE — Progress Notes (Signed)
Bedside TEE    INDICATIONS: Embolic stroke  PROCEDURE:   Informed consent was obtained prior to the procedure from the patient's wife. The risks, benefits and alternatives for the procedure were discussed and the patient comprehended these risks.  Risks include, but are not limited to, cough, sore throat, vomiting, nausea, somnolence, esophageal and stomach trauma or perforation, bleeding, low blood pressure, aspiration, pneumonia, infection, trauma to the teeth and death.    No additional sedation was administered for this procedure. The patient was on mechanical ventilation.  After a procedural time-out, the transesophageal probe was inserted in the esophagus and stomach without difficulty and multiple views were obtained.  The patient was kept under observation until the patient left the procedure room.  The patient left the procedure room in stable condition.   Agitated microbubble saline contrast was not administered.  COMPLICATIONS:    There were no immediate complications.  FINDINGS:  Severely depressed left ventricular systolic function with global hypokinesis. No left ventricular thrombus is seen. Severe biatrial dilation. No evidence of PFO/ASD. No vegetations and no other important valvular abnormalities. Relatively mild aortic atherosclerosis.  There is a well seated Watchman occluder in the left atrial appendage. There is a 5 mm wide gap over a roughly 45 degree arc at the anterolateral aspect of the device.  RECOMMENDATIONS:     The small peri-device leak could be a cause for embolic events, although no thrombus is seen.  Risk versus benefit of re-anticoagulation in this elderly gentleman with serious previous bleeding complications is debatable. Need to have a shared decision making discussion with the patient and his family  Time Spent Directly with the Patient:  30 minutes   Kameka Whan 09/13/2021, 2:28 PM

## 2021-09-13 NOTE — Progress Notes (Signed)
NAME:  Elijah Stone, MRN:  [id number], DOB:  11-Feb-1931, LOS: 1 ADMISSION DATE:  09/12/2021, CONSULTATION DATE:  09/12/21 REFERRING MD:  Dr. Estanislado Pandy, CHIEF COMPLAINT:  Left facial droop   History of Present Illness:  88 y/o M who presented to [facility] on 5/22 with reports of left sided facial droop, food falling out of his mouth during lunch and leaning to the left.    The patient presented from home. He was eating lunch with his wife when she noted him to have food falling out of his mouth, dysarthria, left facial droop and leaning to the left. He was last known well at 1400.  EMS was activated to the home as a CODE STROKE.  Initial work up included a UDS which was negative, UA negative, COVID/influenza screening pending. EKG showed atrial fibrillation with IVCD.  Initial labs - Na 137, K 3.9, Cl 104, glucose 139, BUN 16, Sr Cr 1.07, WBC 4.7, Hgb 12, MVC 100.3, and platelets 126.  CT Code stroke showed no evidence of acute abnormality. CTA head showed mid right M2 MCA occlusion within the sylvian fissure, moderate proximal left P PCA stenosis, mild to moderate left M2 MCA stenosis.  The patient was taken emergently to Neuro IR for revascularization. He was not a candidate for tPA given recent SAH.  He underwent a right common carotid arteriogram with findings of occluded M2 M3 region of the inferior division of the right middle cerebral artery.  Dr. Estanislado Pandy performed revascularization of the occluded right MCA with 1 pass with 4x55m retrieval device and contact aspiration achieving a TICI revascularization.  Post CT brain x2 showed moderate amount of contrast plus blood in the right perisylvian and the right temporal parietla subarachnoid regions.  The patient remained intubated post procedure and was returned to ICU on mechanical ventilation.   PCCM consulted for post procedure ICU care.   Pertinent  Medical History  Atrial Fibrillation s/p Watchman on ASA, NICM/ HFrEF, SAH, pancytopenia, HTN,  hypothyroidism, BPH, OSA on CPAP  Significant Hospital Events: Including procedures, antibiotic start and stop dates in addition to other pertinent events   5/22 Admit with left facial droop, work up consistent with CVA, left intubated on cleviprex and propofol gtt  Interim History / Subjective:  Intermittently agitated overnight, MAE but not following commands Wife at bedside reports patient is very HOH> bilateral hearing aids placed this morning Off cleviprex most of the night, but gets hypertensive and tachycardic when sedation reduced MRI overnight   Objective   Blood pressure 114/71, pulse 99, temperature (!) 96.8 F (36 C), temperature source Axillary, resp. rate 20, height '6\' 3"'$  (1.905 m), weight 78 kg, SpO2 100 %.    Vent Mode: PSV;CPAP FiO2 (%):  [40 %-60 %] 40 % Set Rate:  [12 bmp-16 bmp] 12 bmp Vt Set:  [670 mL] 670 mL PEEP:  [5 cmH20] 5 cmH20 Pressure Support:  [5 cmH20] 5 cmH20 Plateau Pressure:  [15 cmH20-18 cmH20] 18 cmH20   Intake/Output Summary (Last 24 hours) at 09/13/2021 0919 Last data filed at 09/13/2021 0600 Gross per 24 hour  Intake 1916.74 ml  Output 950 ml  Net 966.74 ml   Filed Weights   09/12/21 1500  Weight: 78 kg    Examination: Propofol paused for exam General:  Critically ill elderly male lying in bed restless, but NAD HEENT: MM pink/moist, ETT, OGT, pupils 3/reactive, bilateral hearing aids placed Neuro:  MAE/ agitated but not following commands CV: irir, rate in 120s while agitated, right  femoral site wnl> R dp remains intermittently dopplerable, bilateral PT remains strong, L DP faint palpable PULM:  non labored, CTA, no secretions, weaning well on PSV 5/5 GI: soft, bs+, ND/ NT, condom cath Extremities: warm/dry, no LE edema  Skin: no rashes   Afebrile  UOP 900 ml/24hr Labs> K 4.3, sCr 1.09, WBC 7.3, Hgb 10.8, Plts 118, TSH 0.212 CXR yest> stable ETT/ OGT, stable chronic interstitial changes   5/23 MRI brain>  1. Acute infarcts in  the right insula and right temporal lobe, with smaller areas of infarction in the right frontal and parietal cortex, consistent with the prior right MCA occlusion. The areas of infarction in the right insula, right temporal lobe and right parietal lobe demonstrate petechial hemorrhage. No mass effect or midline shift. 2. Subarachnoid hemorrhage along the right temporal and frontoparietal regions. 3. Trace susceptibility artifact in the occipital horns of the lateral ventricles, likely hemorrhage, but of unknown acuity. No hydrocephalus.  Resolved Hospital Problem list    Assessment & Plan:   Acute R M2/ M3 CVA s/p mechanical thrombectomy with TICI - post procedure CTH with moderate amount of contrast plus blood in R perisylvian and right temporal parietal subarachnoid regions  P:  - per Neuro and NIR - SBP goal 120-140 for 24hrs, cleviprex as needed > weaning down - neurovascular checks per protocol/ flat per IR> right DP stable> faint intermittent dopplerable only, strong PT> foot appears perfused.  Will check arterial doppler   - MRI overnight> see above - serial neuro exams - ASA/ statin per neuro - plans for TEE per stroke team to evaluate watchman device  - TTE pending/ A1c 5.7/ lipid panel> LDL 103  - PT/ OT/ SLP when appropriate  - continue CBG q 4, add SSI if > 180     Acute respiratory insufficiency post procedure  OSA on CPAP - chronic stable ILD changes on CXR P:  - continue MV support, PRVC with daily PSV trials.  Weaning well today but will remain intubated for now till TEE can be performed - VAP prevention protocol/ PPI -PAD protocol for sedation> change propofol to precedex and continue prn fentanyl for RASS goal 0/-1 w/bowel regimen - wean FiO2 as able for SpO2 >92%  - SLP post extubation     NICM/ HFrEF - 03/30/21 prior EF 35-40%, mild to moderately reduced RVSF HTN - followed by [facility] cardiology P:  - continue holding home entresto, toprol XL  - cleviprex as  above for SBP goals, - TTE ordered   Afib  - watchman in place 2017 and on home ASA given previous [facility] 2021 P:  - tele monitoring  - currently rate controlled when adequately sedated - consider adding low dose lopressor if sustained HR> 120 - Stroke to consult cardiology for TEE prior to extubation - goal K> 4, Mag> 2     Macrocytic anemia Thrombocytopenia, chronic  - hx of pancytopenia dating back to 2013, prior evaluated by heme, felt early MDS, stable w/active surveillance  - baseline plts ~112k P:  - trend CBC, transfuse for Hgb< 7, monitor for bleeding    Hypothyroidism - TSH 0.212, previously 1.36 04/2021 - hold home synthroid   Best Practice (right click and "Reselect all SmartList Selections" daily)   Diet/type: NPO; start TF if not extubated today  DVT prophylaxis: SCD GI prophylaxis: PPI Lines: N/A Foley:  N/A Code Status:  full code Last date of multidisciplinary goals of care discussion [per primary team]  Labs  CBC: Recent Labs  Lab 09/12/21 1459 09/12/21 1506 09/12/21 1744 09/13/21 0540  WBC 4.7  --   --  7.3  NEUTROABS 2.0  --   --  6.3  HGB 12.0* 13.3 10.5* 10.8*  HCT 36.3* 39.0 31.0* 32.6*  MCV 100.3*  --   --  97.9  PLT 126*  --   --  118*    Basic Metabolic Panel: Recent Labs  Lab 09/12/21 1459 09/12/21 1506 09/12/21 1744 09/13/21 0540  NA 137 140 141 140  K 3.9 4.1 4.2 4.3  CL 104 102  --  111  CO2 26  --   --  22  GLUCOSE 139* 139*  --  149*  BUN 16 20  --  17  CREATININE 1.07 1.00  --  1.09  CALCIUM 9.2  --   --  8.8*   GFR: Estimated Creatinine Clearance: 50.7 mL/min (by C-G formula based on SCr of 1.09 mg/dL). Recent Labs  Lab 09/12/21 1459 09/13/21 0540  WBC 4.7 7.3    Liver Function Tests: Recent Labs  Lab 09/12/21 1459 09/13/21 0540  AST 19 29  ALT 17 22  ALKPHOS 82 77  BILITOT 0.7 0.6  PROT 6.8 6.1*  ALBUMIN 4.0 3.3*   No results for input(s): LIPASE, AMYLASE in the last 168 hours. No results for  input(s): AMMONIA in the last 168 hours.  ABG    Component Value Date/Time   PHART 7.501 (H) 09/12/2021 1744   PCO2ART 35.1 09/12/2021 1744   PO2ART 224 (H) 09/12/2021 1744   HCO3 27.5 09/12/2021 1744   TCO2 29 09/12/2021 1744   O2SAT 100 09/12/2021 1744     Coagulation Profile: Recent Labs  Lab 09/12/21 1459  INR 1.0    Cardiac Enzymes: No results for input(s): CKTOTAL, CKMB, CKMBINDEX, TROPONINI in the last 168 hours.  HbA1C: Hgb A1c MFr Bld  Date/Time Value Ref Range Status  09/12/2021 05:45 PM 5.7 (H) 4.8 - 5.6 % Final    Comment:    (NOTE) Pre diabetes:          5.7%-6.4%  Diabetes:              >6.4%  Glycemic control for   <7.0% adults with diabetes     CBG: Recent Labs  Lab 09/12/21 1834 09/12/21 1954 09/13/21 0036 09/13/21 0344 09/13/21 0718  GLUCAP 93 146* 176* 169* 141*     Critical care time: 35 mins     Kennieth Rad, ACNP [facility] 09/13/2021, 9:19 AM  See Amion for pager If no response to pager, please call PCCM consult pager After 7:00 pm call Elink

## 2021-09-13 NOTE — Progress Notes (Addendum)
STROKE TEAM PROGRESS NOTE   INTERVAL HISTORY Patient is seen in his room with his wife and son at the bedside.  Yesterday, he presented with left facial droop, dysarthria and left arm weakness. He was found to have a right MCA M2 occlusion and was taken for mechanical thrombectomy.  This was successful.  Patient remains intubated as he will need to undergo TEE to evaluate Watchman device and look for possible adherent conditions.  Clot or other sources of emboli.  Vitals:   09/13/21 1200 09/13/21 1210 09/13/21 1215 09/13/21 1230  BP: 92/60 102/68 (!) 96/57 97/75  Pulse: (!) 55 (!) 55 (!) 56 (!) 49  Resp: (!) '21 17 14 17  '$ Temp: (!) 97 F (36.1 C)     TempSrc: Axillary     SpO2: 100% 100% 100% 99%  Weight:      Height:       CBC:  Recent Labs  Lab 09/12/21 1459 09/12/21 1506 09/12/21 1744 09/13/21 0540  WBC 4.7  --   --  7.3  NEUTROABS 2.0  --   --  6.3  HGB 12.0*   < > 10.5* 10.8*  HCT 36.3*   < > 31.0* 32.6*  MCV 100.3*  --   --  97.9  PLT 126*  --   --  118*   < > = values in this interval not displayed.   Basic Metabolic Panel:  Recent Labs  Lab 09/12/21 1459 09/12/21 1506 09/12/21 1744 09/13/21 0540  NA 137 140 141 140  K 3.9 4.1 4.2 4.3  CL 104 102  --  111  CO2 26  --   --  22  GLUCOSE 139* 139*  --  149*  BUN 16 20  --  17  CREATININE 1.07 1.00  --  1.09  CALCIUM 9.2  --   --  8.8*   Lipid Panel:  Recent Labs  Lab 09/12/21 1745 09/13/21 0540  CHOL 175  --   TRIG 63 37  HDL 59  --   CHOLHDL 3.0  --   VLDL 13  --   LDLCALC 103*  --    HgbA1c:  Recent Labs  Lab 09/12/21 1745  HGBA1C 5.7*   Urine Drug Screen:  Recent Labs  Lab 09/12/21 1455  LABOPIA NONE DETECTED  COCAINSCRNUR NONE DETECTED  LABBENZ NONE DETECTED  AMPHETMU NONE DETECTED  THCU NONE DETECTED  LABBARB NONE DETECTED    Alcohol Level No results for input(s): ETH in the last 168 hours.  IMAGING past 24 hours MR BRAIN WO CONTRAST  Result Date: 09/13/2021 CLINICAL DATA:  New  onset left-sided weakness, left facial droop, dysarthria, and neglect EXAM: MRI HEAD WITHOUT CONTRAST TECHNIQUE: Multiplanar, multiecho pulse sequences of the brain and surrounding structures were obtained without intravenous contrast. COMPARISON:  None Available. FINDINGS: Brain: Restricted diffusion with ADC correlates in the right MCA distribution, most prominently involving the insula insula and right temporal lobe (series 5, image 89-91), with smaller areas of restricted diffusion in the right frontal (series 7, image 71) and right parietal lobes (series 5, images 97-100 and series 7, images 57-58). Petechial hemorrhage is associated with the right insular, right temporal, and right parietal areas of infarction, with additional subarachnoid hemorrhage along the right temporal and frontoparietal lobes. No mass, mass effect, or midline shift. Trace susceptibility artifact is seen layering in the occipital horns of the lateral ventricles (series 14, image 25), likely hemorrhage, which is technically age indeterminate. No hydrocephalus. Scattered punctate foci  of susceptibility in the bilateral cerebral hemispheres, likely represent sequela of prior microhemorrhages, with larger foci of hemosiderin deposition in the left parietal and right occipital lobes likely the sequela of small hemorrhagic infarcts. Vascular: Normal arterial flow voids. Skull and upper cervical spine: Normal marrow signal. Sinuses/Orbits: Mucosal thickening in the right maxillary sinus and ethmoid air cells. Status post bilateral lens replacements. Other: The mastoids are well aerated. IMPRESSION: 1. Acute infarcts in the right insula and right temporal lobe, with smaller areas of infarction in the right frontal and parietal cortex, consistent with the prior right MCA occlusion. The areas of infarction in the right insula, right temporal lobe and right parietal lobe demonstrate petechial hemorrhage. No mass effect or midline shift. 2.  Subarachnoid hemorrhage along the right temporal and frontoparietal regions. 3. Trace susceptibility artifact in the occipital horns of the lateral ventricles, likely hemorrhage, but of unknown acuity. No hydrocephalus. These results will be called to the ordering clinician or representative by the Radiologist Assistant, and communication documented in the PACS or [employer]. Electronically Signed   By: Merilyn Baba M.D.   On: 09/13/2021 00:39   DG Chest Port 1 View  Result Date: 09/12/2021 CLINICAL DATA:  Status post intubation. EXAM: PORTABLE CHEST 1 VIEW COMPARISON:  03/29/2021 FINDINGS: ET tube tip is in satisfactory position approximately 4 cm above the carina. Enteric tube tip and side port are in the left upper quadrant of the abdomen below the GE junction. Stable cardiomediastinal contours. Biapical pleuroparenchymal scarring is again identified and appears similar to previous exam. Chronic diffuse interstitial reticulation within upper lung zone predominance is again noted. No superimposed airspace consolidation, pleural effusion or edema. IMPRESSION: 1. Satisfactory position of the ET tube with tip above the carina. 2. Enteric tube tip and side port are below the level of the hemidiaphragms. 3. Chronic interstitial changes within both lungs, similar to previous exam. Electronically Signed   By: Kerby Moors M.D.   On: 09/12/2021 18:41   ECHOCARDIOGRAM COMPLETE  Result Date: 09/13/2021    ECHOCARDIOGRAM REPORT   Patient Name:   KAYMAN SNUFFER Date of Exam: 09/13/2021 Medical Rec #:  [id number]    Height:       75.0 in Accession #:    [id number]   Weight:       172.0 lb Date of Birth:  [birth year]     BSA:          2.058 m Patient Age:    88 years     BP:           119/79 mmHg Patient Gender: M            HR:           69 bpm. Exam Location:  Inpatient Procedure: 2D Echo, Cardiac Doppler, Color Doppler and Intracardiac            Opacification Agent Indications:    Stroke  History:        Patient  has no prior history of Echocardiogram examinations.  Sonographer:    Jyl Heinz Referring Phys: [id number] DEVON SHAFER  Sonographer Comments: Echo performed with patient supine and on artificial respirator. IMPRESSIONS  1. Left ventricular ejection fraction, by estimation, is 30 to 35%. The left ventricle has moderately decreased function. The left ventricle demonstrates global hypokinesis. Left ventricular diastolic parameters are consistent with Grade III diastolic dysfunction (restrictive).  2. Right ventricular systolic function is normal. The right ventricular size is normal.  3. Left atrial size  was severely dilated.  4. Right atrial size was severely dilated.  5. The mitral valve is degenerative. Mild to at most mild-moderate mitral valve regurgitation. Atrial functional in nature, blunting of pulmonary vein signal; evidence of increase left atrial pressure.  6. Tricuspid valve regurgitation is mild to moderate and eccentric.  7. The aortic valve is tricuspid. Aortic valve regurgitation is not visualized. No aortic stenosis is present.  8. The inferior vena cava is normal in size with <50% respiratory variability, suggesting right atrial pressure of 8 mmHg, thought patient is on ventilator. Comparison(s): No prior Echocardiogram. FINDINGS  Left Ventricle: Left ventricular ejection fraction, by estimation, is 30 to 35%. The left ventricle has moderately decreased function. The left ventricle demonstrates global hypokinesis. Definity contrast agent was given IV to delineate the left ventricular endocardial borders. The left ventricular internal cavity size was normal in size. There is no left ventricular hypertrophy. Left ventricular diastolic parameters are consistent with Grade III diastolic dysfunction (restrictive). Right Ventricle: The right ventricular size is normal. No increase in right ventricular wall thickness. Right ventricular systolic function is normal. Left Atrium: Left atrial size was severely  dilated. Right Atrium: Right atrial size was severely dilated. Pericardium: There is no evidence of pericardial effusion. Mitral Valve: The mitral valve is degenerative in appearance. Mild to moderate mitral valve regurgitation. Tricuspid Valve: Eccentric. The tricuspid valve is normal in structure. Tricuspid valve regurgitation is mild to moderate. No evidence of tricuspid stenosis. Aortic Valve: The aortic valve is tricuspid. Aortic valve regurgitation is not visualized. No aortic stenosis is present. Aortic valve peak gradient measures 6.9 mmHg. Pulmonic Valve: The pulmonic valve was normal in structure. Pulmonic valve regurgitation is not visualized. No evidence of pulmonic stenosis. Aorta: The aortic root and ascending aorta are structurally normal, with no evidence of dilitation. Venous: The inferior vena cava is normal in size with less than 50% respiratory variability, suggesting right atrial pressure of 8 mmHg. IAS/Shunts: No atrial level shunt detected by color flow Doppler.  LEFT VENTRICLE PLAX 2D LVIDd:         5.70 cm      Diastology LVIDs:         4.60 cm      LV e' medial:    6.57 cm/s LV PW:         1.10 cm      LV E/e' medial:  8.2 LV IVS:        0.90 cm      LV e' lateral:   10.30 cm/s LVOT diam:     2.20 cm      LV E/e' lateral: 5.2 LV SV:         73 LV SV Index:   35 LVOT Area:     3.80 cm  LV Volumes (MOD) LV vol d, MOD A2C: 139.0 ml LV vol d, MOD A4C: 144.0 ml LV vol s, MOD A2C: 86.4 ml LV vol s, MOD A4C: 96.1 ml LV SV MOD A2C:     52.6 ml LV SV MOD A4C:     144.0 ml LV SV MOD BP:      49.7 ml RIGHT VENTRICLE            IVC RV Basal diam:  3.60 cm    IVC diam: 1.80 cm RV S prime:     6.81 cm/s TAPSE (M-mode): 1.2 cm LEFT ATRIUM              Index  RIGHT ATRIUM           Index LA diam:        4.70 cm  2.28 cm/m   RA Area:     28.60 cm LA Vol (A2C):   93.8 ml  45.58 ml/m  RA Volume:   106.00 ml 51.51 ml/m LA Vol (A4C):   90.3 ml  43.88 ml/m LA Biplane Vol: 102.0 ml 49.56 ml/m  AORTIC  VALVE AV Area (Vmax): 2.65 cm AV Vmax:        131.00 cm/s AV Peak Grad:   6.9 mmHg LVOT Vmax:      91.40 cm/s LVOT Vmean:     64.500 cm/s LVOT VTI:       0.192 m  AORTA Ao Root diam: 3.10 cm Ao Asc diam:  3.10 cm MITRAL VALVE               TRICUSPID VALVE MV Area (PHT): 4.46 cm    TR Peak grad:   21.2 mmHg MV Decel Time: 170 msec    TR Vmax:        230.00 cm/s MV E velocity: 53.90 cm/s MV A velocity: 25.40 cm/s  SHUNTS MV E/A ratio:  2.12        Systemic VTI:  0.19 m                            Systemic Diam: 2.20 cm Rudean Haskell MD Electronically signed by Rudean Haskell MD Signature Date/Time: 09/13/2021/11:38:03 AM    Final    CT HEAD CODE STROKE WO CONTRAST  Result Date: 09/12/2021 CLINICAL DATA:  Code stroke.  Neuro deficit, acute, stroke suspected EXAM: CT HEAD WITHOUT CONTRAST TECHNIQUE: Contiguous axial images were obtained from the base of the skull through the vertex without intravenous contrast. RADIATION DOSE REDUCTION: This exam was performed according to the departmental dose-optimization program which includes automated exposure control, adjustment of the mA and/or kV according to patient size and/or use of iterative reconstruction technique. COMPARISON:  CT head July 13, 2020 (without report). FINDINGS: Brain: No evidence of acute large vascular territory infarction, convincing acute hemorrhage, hydrocephalus, extra-axial collection or mass lesion/mass effect. Patchy white matter hypodensities, nonspecific but compatible with chronic microvascular ischemic disease. Cortical hyperdensity associated with the right occipital lobe appears unchanged relative to July 13, 2020 and therefore is favored to represent cortical calcification from prior insult. Vascular: No hyperdense vessel identified. Calcific intracranial atherosclerosis. Skull: No acute fracture. Sinuses/Orbits: Partially imaged right inferior maxillary sinus mucosal thickening and mild ethmoid air cell mucosal thickening.  No acute overall findings. Other: No mastoid effusions. ASPECTS [facility] Stroke Program Early CT Score) Total score (0-10 with 10 being normal): 10. IMPRESSION: 1. No evidence of acute intracranial abnormality.   ASPECTS is 10. 2. Cortical hyperdensity associated with the right occipital lobe appears unchanged relative to July 13, 2020 and therefore is favored to represent cortical calcification from prior insult (over subarachnoid hemorrhage). Findings discussed with Dr. Theda Sers via telephone at 3:13 p.m. Electronically Signed   By: Margaretha Sheffield M.D.   On: 09/12/2021 15:16   CT ANGIO HEAD NECK W WO CM (CODE STROKE)  Result Date: 09/12/2021 CLINICAL DATA:  Neuro deficit, acute, stroke suspected EXAM: CT ANGIOGRAPHY HEAD AND NECK TECHNIQUE: Multidetector CT imaging of the head and neck was performed using the standard protocol during bolus administration of intravenous contrast. Multiplanar CT image reconstructions and MIPs were obtained to evaluate the vascular anatomy. Carotid  stenosis measurements (when applicable) are obtained utilizing NASCET criteria, using the distal internal carotid diameter as the denominator. RADIATION DOSE REDUCTION: This exam was performed according to the departmental dose-optimization program which includes automated exposure control, adjustment of the mA and/or kV according to patient size and/or use of iterative reconstruction technique. CONTRAST:  18m OMNIPAQUE IOHEXOL 350 MG/ML SOLN COMPARISON:  None Available. FINDINGS: CTA NECK FINDINGS Aortic arch: Great vessel origins are patent without significant stenosis. Right carotid system: No evidence of dissection, stenosis (50% or greater) or occlusion. Left carotid system: No evidence of dissection, stenosis (50% or greater) or occlusion. Vertebral arteries: Right dominant. No evidence of dissection, stenosis (50% or greater) or occlusion. Skeleton: Moderate multilevel degenerative change. Other neck: No acute abnormality.  Upper chest: Biapical pleuroparenchymal scarring and probable fibrosis, better characterized on prior CT chest. Review of the MIP images confirms the above findings CTA HEAD FINDINGS Anterior circulation: Mild calcific atherosclerosis of bilateral intracranial ICAs with associated mild narrowing. Right M1 MCA is patent. Occlusion of a mid right M2 MCA vessel. Bilateral ACAs are patent. Left MCA is patent. Mild to moderate proximal left M2 MCA stenosis. Posterior circulation: Bilateral intradural vertebral arteries, basilar artery, and both posterior cerebral arteries are patent. Moderate proximal left P2 PCA stenosis. Venous sinuses: As permitted by contrast timing, patent. Review of the MIP images confirms the above findings IMPRESSION: 1. Mid right M2 MCA occlusion within the sylvian fissure. 2. Moderate proximal left P2 PCA stenosis. 3. Mild-to-moderate left M2 MCA stenosis. Findings discussed with Dr. CTheda Sersvia telephone at 3:25 p.m. Electronically Signed   By: FMargaretha SheffieldM.D.   On: 09/12/2021 15:27    PHYSICAL EXAM Elderly Caucasian male who is intubated and sedated.  Not in distress . Afebrile. Head is nontraumatic. Neck is supple without bruit.    Cardiac exam no murmur or gallop. Lungs are clear to auscultation. Distal pulses are well felt.  Neurological Exam : Patient is intubated and sedated but awake and interactive.  Follows simple midline and one-step commands and moves all 4 extremities equally well against gravity.  No focal weakness.  Sensation appears preserved bilaterally. ASSESSMENT/PLAN Mr. REmre Stockis a 88y.o. male with history of SAH, atrial fibrillation s/p Watchman procedure, CHF, pancytopenia, HTN, hypothyroidism, BPH and OSA on CPAP presenting with left facial droop, dysarthria and left arm weakness. He was found to have a right MCA M2 occlusion and was taken for mechanical thrombectomy.  This was successful.  Patient remains intubated as he will need to undergo TEE to  evaluate Watchman device and look for other sources of emboli.  Stroke: Right MCA infarct s/p thrombectomy likely secondary due to embolism in setting of atrial fibrillation not on anticoagulation but s/p Watchman procedure Code Stroke CT head No acute abnormality. ASPECTS 10.    CTA head & neck mid right M2 MCA occlusion, moderate proximal left P2 PCA stenosis, mild to moderate left M2 MCA stenosis MRI  acute infarcts in right insula and right temporal lobe, SAH along right temporal and frontoparietal regions 2D Echo EF 393-71% grade 3 diastolic dysfunction, severely dilated left atrium, mild mitral regurgitation, mild to moderate tricuspid regurgitation, no atrial level shunt TEE pending LDL 103 HgbA1c 5.7 VTE prophylaxis - SCDs    Diet   Diet NPO time specified   aspirin 81 mg daily prior to admission, now on No antithrombotic. [facility]:  [facility] Disposition:  pending  Atrial fibrillation Patient is in permanent a-fib Stopped taking  warfarin due to [facility] several years ago S/p Watchman procedure Will need TEE to evaluate for cardiac thrombus  Hypertension Home meds:  none Stable Keep SBP 120-140 for 24 hours Long-term BP goal normotensive  Hyperlipidemia Home meds:  none LDL 103, goal < 70 Add rosuvastatin 20 mg daily  Continue statin at discharge   Other Stroke Risk Factors Advanced Age >/= 62  Congestive heart failure  Other Active Problems Hypothyroidism Continue home synthroid  Hospital day # [place] , MSN, AGACNP-BC [facility] See Amion for schedule and pager information 09/13/2021 1:12 PM    STROKE MD NOTE :  I have personally obtained history,examined this patient, reviewed notes, independently viewed imaging studies, participated in medical decision making and plan of care.ROS completed by me personally and pertinent positives fully documented  I have made any additions or clarifications  directly to the above note. Agree with note above.  He presented with sudden onset of aphasia and right hemiparesis due to right t M2 occlusion and he underwent successful mechanical thrombectomy.  Postprocedure CT and MRI scan shows small localized subarachnoid hemorrhage in the right sylvian fissure .plan hold antiplatelet agents for now.  Check TEE to look for any clot on the Watchman device.  Patient will likely need to be started on anticoagulation with Eliquis in a few days after subarachnoid blood is resolved.  Cardiology consult.   Discussed with Dr. Curt Bears and Dr. Tacy Learn.  Long discussion with patient's wife and son at the bedside and answered questions about his care. This patient is critically ill and at significant risk of neurological worsening, death and care requires constant monitoring of vital signs, hemodynamics,respiratory and cardiac monitoring, extensive review of multiple databases, frequent neurological assessment, discussion with family, other specialists and medical decision making of high complexity.I have made any additions or clarifications directly to the above note.This critical care time does not reflect procedure time, or teaching time or supervisory time of PA/NP/Med Resident etc but could involve care discussion time.  I spent 40 minutes of neurocritical care time  in the care of  this patient.     Antony Contras, MD Medical Director [place] Pager: [phone] 09/13/2021 1:27 PM   To contact Stroke Continuity provider, please refer to [email or web address] After hours, contact General Neurology

## 2021-09-13 NOTE — Procedures (Signed)
Extubation Procedure Note  Patient Details:   Name: Elijah Stone DOB: 11-18-1930 MRN: [id number]   Airway Documentation:    Vent end date: 09/13/21 Vent end time: 1653   Evaluation  O2 sats: stable throughout Complications: No apparent complications Patient did tolerate procedure well. Bilateral Breath Sounds: Clear, Diminished 4L min [place] Incentive spirometer   Yes  Revonda Standard 09/13/2021, 4:55 PM

## 2021-09-14 ENCOUNTER — Encounter (HOSPITAL_COMMUNITY): Payer: Medicare Other

## 2021-09-14 ENCOUNTER — Other Ambulatory Visit (HOSPITAL_COMMUNITY): Payer: Self-pay

## 2021-09-14 ENCOUNTER — Inpatient Hospital Stay (HOSPITAL_COMMUNITY): Payer: Medicare Other

## 2021-09-14 DIAGNOSIS — I4821 Permanent atrial fibrillation: Secondary | ICD-10-CM | POA: Diagnosis not present

## 2021-09-14 DIAGNOSIS — I63311 Cerebral infarction due to thrombosis of right middle cerebral artery: Secondary | ICD-10-CM | POA: Diagnosis not present

## 2021-09-14 LAB — GLUCOSE, CAPILLARY
Glucose-Capillary: 123 mg/dL — ABNORMAL HIGH (ref 70–99)
Glucose-Capillary: 80 mg/dL (ref 70–99)
Glucose-Capillary: 99 mg/dL (ref 70–99)
Glucose-Capillary: 104 mg/dL — ABNORMAL HIGH (ref 70–99)
Glucose-Capillary: 125 mg/dL — ABNORMAL HIGH (ref 70–99)
Glucose-Capillary: 77 mg/dL (ref 70–99)

## 2021-09-14 LAB — RENAL FUNCTION PANEL
Albumin: 3.5 g/dL (ref 3.5–5.0)
Anion gap: 7 (ref 5–15)
BUN: 20 mg/dL (ref 8–23)
CO2: 21 mmol/L — ABNORMAL LOW (ref 22–32)
Calcium: 8.8 mg/dL — ABNORMAL LOW (ref 8.9–10.3)
Chloride: 112 mmol/L — ABNORMAL HIGH (ref 98–111)
Creatinine, Ser: 0.93 mg/dL (ref 0.61–1.24)
GFR, Estimated: >60 mL/min (ref >60)
Glucose, Bld: 123 mg/dL — ABNORMAL HIGH (ref 70–99)
Phosphorus: 3.5 mg/dL (ref 2.5–4.6)
Potassium: 4.4 mmol/L (ref 3.5–5.1)
Sodium: 140 mmol/L (ref 135–145)

## 2021-09-14 LAB — MAGNESIUM: Magnesium: 2 mg/dL (ref 1.7–2.4)

## 2021-09-14 MED ORDER — DOCUSATE SODIUM 100 MG PO CAPS
100.0000 mg | ORAL_CAPSULE | Freq: Two times a day (BID) | ORAL | Status: DC
  Administered 2021-09-14: 100 mg via ORAL
  Filled 2021-09-14: qty 1

## 2021-09-14 MED ORDER — ROSUVASTATIN CALCIUM 20 MG PO TABS
20.0000 mg | ORAL_TABLET | Freq: Every day | ORAL | Status: DC
  Administered 2021-09-15: 20 mg via ORAL
  Filled 2021-09-14: qty 1

## 2021-09-14 MED ORDER — POLYETHYLENE GLYCOL 3350 17 G PO PACK
17.0000 g | PACK | Freq: Every day | ORAL | Status: DC
  Administered 2021-09-15: 17 g via ORAL
  Filled 2021-09-14: qty 1

## 2021-09-14 NOTE — Progress Notes (Signed)
Referring Physician(s): Code Stroke  Supervising Physician: Luanne Bras  Patient Status:  [facility] - In-pt  Chief Complaint:  Right MCA stroke s/p mechanical thrombectomy with Dr. Estanislado Pandy 09/12/21  Subjective:  Pt sitting upright in recliner. He is A&O x3. He states he is tolerating liquid diet w/o difficulty. His only c/o is sore throat from ET tube. Pt adds that he is walking up/down hall with PT using walker.   Allergies: Patient has no known allergies.  Medications: Prior to Admission medications   Medication Sig Start Date End Date Taking? Authorizing Provider  Acetaminophen (ACETAMIN PO) Take 1 tablet by mouth 2 (two) times daily as needed (mild pain).   Yes [provider]  aspirin EC 81 MG tablet Take 81 mg by mouth daily. Swallow whole.   Yes [provider]  cycloSPORINE (RESTASIS) 0.05 % ophthalmic emulsion Place 1 drop into both eyes 2 (two) times daily.   Yes [provider]  fluocinonide cream (LIDEX) 0.07 % Apply 1 application. topically 2 (two) times daily as needed (rash).   Yes [provider]  latanoprost (XALATAN) 0.005 % ophthalmic solution Place 1 drop into both eyes at bedtime.   Yes [provider]  levocetirizine (XYZAL) 5 MG tablet Take 5 mg by mouth daily as needed for allergies.   Yes [provider]  levothyroxine (SYNTHROID) 88 MCG tablet Take 88 mcg by mouth daily before breakfast.   Yes [provider]  mupirocin ointment (BACTROBAN) 2 % Apply 1 application. topically 2 (two) times daily as needed (rash).   Yes [provider]  omeprazole (PRILOSEC) 40 MG capsule Take 40 mg by mouth daily.   Yes [provider]  polyethylene glycol powder (MIRALAX) 17 GM/SCOOP powder Take 17 g by mouth daily.   Yes [provider]  Polyvinyl Alcohol-Povidone (REFRESH OP) Place 1 drop into both eyes in the morning and at bedtime.   Yes [provider]   sacubitril-valsartan (ENTRESTO) 24-26 MG Take 1 tablet by mouth at bedtime.   Yes [provider]  Sodium Chloride, Hypertonic, (MURO 128 OP) Apply 1 drop to eye 2 (two) times daily.   Yes [provider]  cholecalciferol (VITAMIN D3) 25 MCG (1000 UNIT) tablet Take 1,000 Units by mouth daily.    [provider]  fluticasone (FLONASE) 50 MCG/ACT nasal spray Place 1 spray into both nostrils daily.    [provider]     Vital Signs: BP 118/74   Pulse 76   Temp 97.9 F (36.6 C) (Oral)   Resp 19   Ht '6\' 3"'$  (1.905 m)   Wt 171 lb 15.3 oz (78 kg)   SpO2 96%   BMI 21.49 kg/m   Physical Exam Vitals reviewed.  Constitutional:      General: He is not in acute distress.    Appearance: He is not ill-appearing.  Eyes:     Extraocular Movements: Extraocular movements intact.     Pupils: Pupils are equal, round, and reactive to light.  Cardiovascular:     Rate and Rhythm: Normal rate. Rhythm irregular.  Pulmonary:     Effort: Pulmonary effort is normal. No respiratory distress.  Neurological:     Mental Status: He is alert and oriented to person, place, and time.     Comments: Alert, aware and oriented X 3 Speech and comprehension is intact.  PERRL bilaterally No facial droop noted Can spontaneously move all 4 extremities.  Hand grip strength equal bilaterally. Negative pronator  drift. Fine motor and coordination grossly intact Gait not assessed Romberg not assessed Heel to toe not assessed Distal pulses not assessed        Psychiatric:        Mood and Affect: Mood normal.        Behavior: Behavior normal.        Thought Content: Thought content normal.        Judgment: Judgment normal.    Imaging: CT HEAD WO CONTRAST (5MM)  Result Date: 09/14/2021 CLINICAL DATA:  Stroke follow-up EXAM: CT HEAD WITHOUT CONTRAST TECHNIQUE: Contiguous axial images were obtained from the base of the skull through the vertex without intravenous contrast.  RADIATION DOSE REDUCTION: This exam was performed according to the departmental dose-optimization program which includes automated exposure control, adjustment of the mA and/or kV according to patient size and/or use of iterative reconstruction technique. COMPARISON:  Brain MRI from yesterday. Flat table CT from 2 days prior FINDINGS: Brain: Subarachnoid hemorrhage along the right sylvian fissure and adjacent sulci. Maximal density material (extravasated contrast) has diffused and remaining high density is attributed to blood products which appear non progressed. Mineralization at the right occipital cortex. No parenchymal hematoma. Right cerebral swelling better seen by prior MRI. No hydrocephalus or midline shift. No masslike finding Vascular: No hyperdense vessel or unexpected calcification. Skull: Normal. Negative for fracture or focal lesion. Sinuses/Orbits: No acute finding. IMPRESSION: No progression of subarachnoid hemorrhage along the right cerebral convexity which partially obscures known right MCA territory infarcts. Electronically Signed   By: Jorje Guild M.D.   On: 09/14/2021 05:22   MR BRAIN WO CONTRAST  Result Date: 09/13/2021 CLINICAL DATA:  New onset left-sided weakness, left facial droop, dysarthria, and neglect EXAM: MRI HEAD WITHOUT CONTRAST TECHNIQUE: Multiplanar, multiecho pulse sequences of the brain and surrounding structures were obtained without intravenous contrast. COMPARISON:  None Available. FINDINGS: Brain: Restricted diffusion with ADC correlates in the right MCA distribution, most prominently involving the insula insula and right temporal lobe (series 5, image 89-91), with smaller areas of restricted diffusion in the right frontal (series 7, image 71) and right parietal lobes (series 5, images 97-100 and series 7, images 57-58). Petechial hemorrhage is associated with the right insular, right temporal, and right parietal areas of infarction, with additional subarachnoid  hemorrhage along the right temporal and frontoparietal lobes. No mass, mass effect, or midline shift. Trace susceptibility artifact is seen layering in the occipital horns of the lateral ventricles (series 14, image 25), likely hemorrhage, which is technically age indeterminate. No hydrocephalus. Scattered punctate foci of susceptibility in the bilateral cerebral hemispheres, likely represent sequela of prior microhemorrhages, with larger foci of hemosiderin deposition in the left parietal and right occipital lobes likely the sequela of small hemorrhagic infarcts. Vascular: Normal arterial flow voids. Skull and upper cervical spine: Normal marrow signal. Sinuses/Orbits: Mucosal thickening in the right maxillary sinus and ethmoid air cells. Status post bilateral lens replacements. Other: The mastoids are well aerated. IMPRESSION: 1. Acute infarcts in the right insula and right temporal lobe, with smaller areas of infarction in the right frontal and parietal cortex, consistent with the prior right MCA occlusion. The areas of infarction in the right insula, right temporal lobe and right parietal lobe demonstrate petechial hemorrhage. No mass effect or midline shift. 2. Subarachnoid hemorrhage along the right temporal and frontoparietal regions. 3. Trace susceptibility artifact in the occipital horns of the lateral ventricles, likely hemorrhage, but of unknown acuity. No hydrocephalus. These results will be called to the  ordering clinician or representative by the Radiologist Assistant, and communication documented in the PACS or [employer]. Electronically Signed   By: Merilyn Baba M.D.   On: 09/13/2021 00:39   DG Chest Port 1 View  Result Date: 09/12/2021 CLINICAL DATA:  Status post intubation. EXAM: PORTABLE CHEST 1 VIEW COMPARISON:  03/29/2021 FINDINGS: ET tube tip is in satisfactory position approximately 4 cm above the carina. Enteric tube tip and side port are in the left upper quadrant of the abdomen  below the GE junction. Stable cardiomediastinal contours. Biapical pleuroparenchymal scarring is again identified and appears similar to previous exam. Chronic diffuse interstitial reticulation within upper lung zone predominance is again noted. No superimposed airspace consolidation, pleural effusion or edema. IMPRESSION: 1. Satisfactory position of the ET tube with tip above the carina. 2. Enteric tube tip and side port are below the level of the hemidiaphragms. 3. Chronic interstitial changes within both lungs, similar to previous exam. Electronically Signed   By: Kerby Moors M.D.   On: 09/12/2021 18:41   DG Abd Portable 1V  Result Date: 09/13/2021 CLINICAL DATA:  Enteric tube placement EXAM: PORTABLE ABDOMEN - 1 VIEW COMPARISON:  None Available. FINDINGS: Tip of enteric tube is seen in the stomach. Watchman device is noted in the region of left atrial appendage. Lower abdomen and pelvis are not included in the image. IMPRESSION: Tip of enteric tube is seen in the stomach. Electronically Signed   By: Elmer Picker M.D.   On: 09/13/2021 14:42   ECHOCARDIOGRAM COMPLETE  Result Date: 09/13/2021    ECHOCARDIOGRAM REPORT   Patient Name:   REO PORTELA Date of Exam: 09/13/2021 Medical Rec #:  [id number]    Height:       75.0 in Accession #:    [id number]   Weight:       172.0 lb Date of Birth:  07-24-1930     BSA:          2.058 m Patient Age:    23 years     BP:           119/79 mmHg Patient Gender: M            HR:           69 bpm. Exam Location:  Inpatient Procedure: 2D Echo, Cardiac Doppler, Color Doppler and Intracardiac            Opacification Agent Indications:    Stroke  History:        Patient has no prior history of Echocardiogram examinations.  Sonographer:    Jyl Heinz Referring Phys: [id number] DEVON SHAFER  Sonographer Comments: Echo performed with patient supine and on artificial respirator. IMPRESSIONS  1. Left ventricular ejection fraction, by estimation, is 30 to 35%. The left  ventricle has moderately decreased function. The left ventricle demonstrates global hypokinesis. Left ventricular diastolic parameters are consistent with Grade III diastolic dysfunction (restrictive).  2. Right ventricular systolic function is normal. The right ventricular size is normal.  3. Left atrial size was severely dilated.  4. Right atrial size was severely dilated.  5. The mitral valve is degenerative. Mild to at most mild-moderate mitral valve regurgitation. Atrial functional in nature, blunting of pulmonary vein signal; evidence of increase left atrial pressure.  6. Tricuspid valve regurgitation is mild to moderate and eccentric.  7. The aortic valve is tricuspid. Aortic valve regurgitation is not visualized. No aortic stenosis is present.  8. The inferior vena cava is normal in size with <  50% respiratory variability, suggesting right atrial pressure of 8 mmHg, thought patient is on ventilator. Comparison(s): No prior Echocardiogram. FINDINGS  Left Ventricle: Left ventricular ejection fraction, by estimation, is 30 to 35%. The left ventricle has moderately decreased function. The left ventricle demonstrates global hypokinesis. Definity contrast agent was given IV to delineate the left ventricular endocardial borders. The left ventricular internal cavity size was normal in size. There is no left ventricular hypertrophy. Left ventricular diastolic parameters are consistent with Grade III diastolic dysfunction (restrictive). Right Ventricle: The right ventricular size is normal. No increase in right ventricular wall thickness. Right ventricular systolic function is normal. Left Atrium: Left atrial size was severely dilated. Right Atrium: Right atrial size was severely dilated. Pericardium: There is no evidence of pericardial effusion. Mitral Valve: The mitral valve is degenerative in appearance. Mild to moderate mitral valve regurgitation. Tricuspid Valve: Eccentric. The tricuspid valve is normal in  structure. Tricuspid valve regurgitation is mild to moderate. No evidence of tricuspid stenosis. Aortic Valve: The aortic valve is tricuspid. Aortic valve regurgitation is not visualized. No aortic stenosis is present. Aortic valve peak gradient measures 6.9 mmHg. Pulmonic Valve: The pulmonic valve was normal in structure. Pulmonic valve regurgitation is not visualized. No evidence of pulmonic stenosis. Aorta: The aortic root and ascending aorta are structurally normal, with no evidence of dilitation. Venous: The inferior vena cava is normal in size with less than 50% respiratory variability, suggesting right atrial pressure of 8 mmHg. IAS/Shunts: No atrial level shunt detected by color flow Doppler.  LEFT VENTRICLE PLAX 2D LVIDd:         5.70 cm      Diastology LVIDs:         4.60 cm      LV e' medial:    6.57 cm/s LV PW:         1.10 cm      LV E/e' medial:  8.2 LV IVS:        0.90 cm      LV e' lateral:   10.30 cm/s LVOT diam:     2.20 cm      LV E/e' lateral: 5.2 LV SV:         73 LV SV Index:   35 LVOT Area:     3.80 cm  LV Volumes (MOD) LV vol d, MOD A2C: 139.0 ml LV vol d, MOD A4C: 144.0 ml LV vol s, MOD A2C: 86.4 ml LV vol s, MOD A4C: 96.1 ml LV SV MOD A2C:     52.6 ml LV SV MOD A4C:     144.0 ml LV SV MOD BP:      49.7 ml RIGHT VENTRICLE            IVC RV Basal diam:  3.60 cm    IVC diam: 1.80 cm RV S prime:     6.81 cm/s TAPSE (M-mode): 1.2 cm LEFT ATRIUM              Index        RIGHT ATRIUM           Index LA diam:        4.70 cm  2.28 cm/m   RA Area:     28.60 cm LA Vol (A2C):   93.8 ml  45.58 ml/m  RA Volume:   106.00 ml 51.51 ml/m LA Vol (A4C):   90.3 ml  43.88 ml/m LA Biplane Vol: 102.0 ml 49.56 ml/m  AORTIC VALVE AV Area (Vmax):  2.65 cm AV Vmax:        131.00 cm/s AV Peak Grad:   6.9 mmHg LVOT Vmax:      91.40 cm/s LVOT Vmean:     64.500 cm/s LVOT VTI:       0.192 m  AORTA Ao Root diam: 3.10 cm Ao Asc diam:  3.10 cm MITRAL VALVE               TRICUSPID VALVE MV Area (PHT): 4.46 cm    TR  Peak grad:   21.2 mmHg MV Decel Time: 170 msec    TR Vmax:        230.00 cm/s MV E velocity: 53.90 cm/s MV A velocity: 25.40 cm/s  SHUNTS MV E/A ratio:  2.12        Systemic VTI:  0.19 m                            Systemic Diam: 2.20 cm Rudean Haskell MD Electronically signed by Rudean Haskell MD Signature Date/Time: 09/13/2021/11:38:03 AM    Final    ECHO TEE  Result Date: 09/13/2021    TRANSESOPHOGEAL ECHO REPORT   Patient Name:   CARLUS STAY Date of Exam: 09/13/2021 Medical Rec #:  [id number]    Height:       75.0 in Accession #:    [id number]   Weight:       172.0 lb Date of Birth:  December 19, 1930     BSA:          2.058 m Patient Age:    4 years     BP:           121/62 mmHg Patient Gender: M            HR:           72 bpm. Exam Location:  Inpatient Procedure: Transesophageal Echo, Color Doppler and 3D Echo Indications:     Watchman device evaluation                  Stroke  History:         Patient has prior history of Echocardiogram examinations, most                  recent 09/13/2021. Arrythmias:Atrial Fibrillation. S/P Watchman                  device.  Sonographer:     Clayton Lefort RDCS (AE) Referring Phys:  [id number] [place] Diagnosing Phys: Sanda Klein MD  Sonographer Comments: Echo permformed at bedside. PROCEDURE: After discussion of the risks and benefits of a TEE, an informed consent was obtained from a family member. The transesophogeal probe was passed without difficulty through the esophogus of the patient. Image quality was good. The patient developed no complications during the procedure. No additional sedation was used (patient endiotracheally intubated). IMPRESSIONS  1. Left ventricular ejection fraction, by estimation, is 25 to 30%. The left ventricle has severely decreased function. The left ventricle demonstrates global hypokinesis. The left ventricular internal cavity size was mildly dilated. Left ventricular diastolic function could not be evaluated.  2. Right  ventricular systolic function is normal. The right ventricular size is normal.  3. There is a well seated Watchman left atrial appendage occluder. There is a leak at the anterolateral aspect of the device with a width of approximately 5 mm and a circumferential extent of approximately 45 degrees. There is  no device associated thrombus. Left atrial size was severely dilated. No left atrial/left atrial appendage thrombus was detected.  4. Right atrial size was severely dilated.  5. The mitral valve is normal in structure. Mild mitral valve regurgitation.  6. Tricuspid valve regurgitation is mild to moderate.  7. The aortic valve is tricuspid. Aortic valve regurgitation is not visualized. No aortic stenosis is present.  8. There is mild (Grade II) plaque involving the descending aorta. FINDINGS  Left Ventricle: Left ventricular ejection fraction, by estimation, is 25 to 30%. The left ventricle has severely decreased function. The left ventricle demonstrates global hypokinesis. The left ventricular internal cavity size was mildly dilated. There is no left ventricular hypertrophy. Left ventricular diastolic function could not be evaluated due to atrial fibrillation. Left ventricular diastolic function could not be evaluated. Right Ventricle: The right ventricular size is normal. No increase in right ventricular wall thickness. Right ventricular systolic function is normal. Left Atrium: There is a well seated Watchman left atrial appendage occluder. There is a leak at the anterolateral aspect of the device with a width of approximately 5 mm and a circumferential extent of approximately 45 degrees. There is no device associated thrombus. Left atrial size was severely dilated. No left atrial/left atrial appendage thrombus was detected. Right Atrium: Right atrial size was severely dilated. Pericardium: There is no evidence of pericardial effusion. Mitral Valve: The mitral valve is normal in structure. Mild mitral valve  regurgitation, with centrally-directed jet. Tricuspid Valve: The tricuspid valve is normal in structure. Tricuspid valve regurgitation is mild to moderate. Aortic Valve: The aortic valve is tricuspid. Aortic valve regurgitation is not visualized. No aortic stenosis is present. Pulmonic Valve: The pulmonic valve was normal in structure. Pulmonic valve regurgitation is not visualized. Aorta: The aortic root, ascending aorta and aortic arch are all structurally normal, with no evidence of dilitation or obstruction. There is mild (Grade II) plaque involving the descending aorta. IAS/Shunts: No atrial level shunt detected by color flow Doppler. Sanda Klein MD Electronically signed by Sanda Klein MD Signature Date/Time: 09/13/2021/2:42:17 PM    Final    CT HEAD CODE STROKE WO CONTRAST  Result Date: 09/12/2021 CLINICAL DATA:  Code stroke.  Neuro deficit, acute, stroke suspected EXAM: CT HEAD WITHOUT CONTRAST TECHNIQUE: Contiguous axial images were obtained from the base of the skull through the vertex without intravenous contrast. RADIATION DOSE REDUCTION: This exam was performed according to the departmental dose-optimization program which includes automated exposure control, adjustment of the mA and/or kV according to patient size and/or use of iterative reconstruction technique. COMPARISON:  CT head July 13, 2020 (without report). FINDINGS: Brain: No evidence of acute large vascular territory infarction, convincing acute hemorrhage, hydrocephalus, extra-axial collection or mass lesion/mass effect. Patchy white matter hypodensities, nonspecific but compatible with chronic microvascular ischemic disease. Cortical hyperdensity associated with the right occipital lobe appears unchanged relative to July 13, 2020 and therefore is favored to represent cortical calcification from prior insult. Vascular: No hyperdense vessel identified. Calcific intracranial atherosclerosis. Skull: No acute fracture. Sinuses/Orbits:  Partially imaged right inferior maxillary sinus mucosal thickening and mild ethmoid air cell mucosal thickening. No acute overall findings. Other: No mastoid effusions. ASPECTS [facility] Stroke Program Early CT Score) Total score (0-10 with 10 being normal): 10. IMPRESSION: 1. No evidence of acute intracranial abnormality.   ASPECTS is 10. 2. Cortical hyperdensity associated with the right occipital lobe appears unchanged relative to July 13, 2020 and therefore is favored to represent cortical calcification from prior insult (over subarachnoid  hemorrhage). Findings discussed with Dr. Theda Sers via telephone at 3:13 p.m. Electronically Signed   By: Margaretha Sheffield M.D.   On: 09/12/2021 15:16   CT ANGIO HEAD NECK W WO CM (CODE STROKE)  Result Date: 09/12/2021 CLINICAL DATA:  Neuro deficit, acute, stroke suspected EXAM: CT ANGIOGRAPHY HEAD AND NECK TECHNIQUE: Multidetector CT imaging of the head and neck was performed using the standard protocol during bolus administration of intravenous contrast. Multiplanar CT image reconstructions and MIPs were obtained to evaluate the vascular anatomy. Carotid stenosis measurements (when applicable) are obtained utilizing NASCET criteria, using the distal internal carotid diameter as the denominator. RADIATION DOSE REDUCTION: This exam was performed according to the departmental dose-optimization program which includes automated exposure control, adjustment of the mA and/or kV according to patient size and/or use of iterative reconstruction technique. CONTRAST:  55m OMNIPAQUE IOHEXOL 350 MG/ML SOLN COMPARISON:  None Available. FINDINGS: CTA NECK FINDINGS Aortic arch: Great vessel origins are patent without significant stenosis. Right carotid system: No evidence of dissection, stenosis (50% or greater) or occlusion. Left carotid system: No evidence of dissection, stenosis (50% or greater) or occlusion. Vertebral arteries: Right dominant. No evidence of dissection, stenosis (50%  or greater) or occlusion. Skeleton: Moderate multilevel degenerative change. Other neck: No acute abnormality. Upper chest: Biapical pleuroparenchymal scarring and probable fibrosis, better characterized on prior CT chest. Review of the MIP images confirms the above findings CTA HEAD FINDINGS Anterior circulation: Mild calcific atherosclerosis of bilateral intracranial ICAs with associated mild narrowing. Right M1 MCA is patent. Occlusion of a mid right M2 MCA vessel. Bilateral ACAs are patent. Left MCA is patent. Mild to moderate proximal left M2 MCA stenosis. Posterior circulation: Bilateral intradural vertebral arteries, basilar artery, and both posterior cerebral arteries are patent. Moderate proximal left P2 PCA stenosis. Venous sinuses: As permitted by contrast timing, patent. Review of the MIP images confirms the above findings IMPRESSION: 1. Mid right M2 MCA occlusion within the sylvian fissure. 2. Moderate proximal left P2 PCA stenosis. 3. Mild-to-moderate left M2 MCA stenosis. Findings discussed with Dr. CTheda Sersvia telephone at 3:25 p.m. Electronically Signed   By: FMargaretha SheffieldM.D.   On: 09/12/2021 15:27    Labs:  CBC: Recent Labs    09/12/21 1459 09/12/21 1506 09/12/21 1744 09/13/21 0540  WBC 4.7  --   --  7.3  HGB 12.0* 13.3 10.5* 10.8*  HCT 36.3* 39.0 31.0* 32.6*  PLT 126*  --   --  118*    COAGS: Recent Labs    09/12/21 1459  INR 1.0  APTT 29    BMP: Recent Labs    09/12/21 1459 09/12/21 1506 09/12/21 1744 09/13/21 0540 09/14/21 0120  NA 137 140 141 140 140  K 3.9 4.1 4.2 4.3 4.4  CL 104 102  --  111 112*  CO2 26  --   --  22 21*  GLUCOSE 139* 139*  --  149* 123*  BUN 16 20  --  17 20  CALCIUM 9.2  --   --  8.8* 8.8*  CREATININE 1.07 1.00  --  1.09 0.93  GFRNONAA >60  --   --  >60 >60    LIVER FUNCTION TESTS: Recent Labs    09/12/21 1459 09/13/21 0540 09/14/21 0120  BILITOT 0.7 0.6  --   AST 19 29  --   ALT 17 22  --   ALKPHOS 82 77  --    PROT 6.8 6.1*  --   ALBUMIN 4.0 3.3* 3.5  Assessment and Plan:  Right MCA stroke s/p mechanical thrombectomy with Dr. Estanislado Pandy 09/12/21  Pt sitting upright in recliner. He is A&O x3. He states he is tolerating liquid diet w/o difficulty. His only c/o is sore throat from ET tube. Pt adds that he is walking up/down hall with PT using walker.   Alert, aware and oriented X 3 Speech and comprehension is intact.  PERRL bilaterally No facial droop noted Can spontaneously move all 4 extremities.  Hand grip strength equal bilaterally. Negative pronator drift. Fine motor and coordination grossly intact.   Per neurology, pt to transfer from ICU to neuro bed.  Pt to d/c home with home health PT.   Electronically Signed: Tyson Alias, NP 09/14/2021, 12:54 PM   I spent a total of 15 Minutes at the the patient's bedside AND on the patient's hospital floor or unit, greater than 50% of which was counseling/coordinating care for right MCA stroke s/p mechanical thrombectomy.

## 2021-09-14 NOTE — Progress Notes (Addendum)
STROKE TEAM PROGRESS NOTE   INTERVAL HISTORY Patient is seen in his room with his wife and son at the bedside.  TEE showed some leaking around the watchman device.  Follow-up CT scan this morning shows persistent subarachnoid hemorrhage in the right sylvian fissure and very insular region.  Plan to initiate eliquis in a couple of days. He is now extubated and on room air. Speech is clear, left arm strength is significantly improved.   Vitals:   09/14/21 0700 09/14/21 0800 09/14/21 0900 09/14/21 1000  BP: 121/74 126/64 (!) 129/36 121/66  Pulse: 67 80 78 74  Resp: '20 13 18 16  '$ Temp:  (!) 97 F (36.1 C)    TempSrc:  Axillary    SpO2: 98% 98% 96% 93%  Weight:      Height:       CBC:  Recent Labs  Lab 09/12/21 1459 09/12/21 1506 09/12/21 1744 09/13/21 0540  WBC 4.7  --   --  7.3  NEUTROABS 2.0  --   --  6.3  HGB 12.0*   < > 10.5* 10.8*  HCT 36.3*   < > 31.0* 32.6*  MCV 100.3*  --   --  97.9  PLT 126*  --   --  118*   < > = values in this interval not displayed.    Basic Metabolic Panel:  Recent Labs  Lab 09/13/21 0540 09/14/21 0120  NA 140 140  K 4.3 4.4  CL 111 112*  CO2 22 21*  GLUCOSE 149* 123*  BUN 17 20  CREATININE 1.09 0.93  CALCIUM 8.8* 8.8*  MG  --  2.0  PHOS  --  3.5    Lipid Panel:  Recent Labs  Lab 09/12/21 1745 09/13/21 0540  CHOL 175  --   TRIG 63 37  HDL 59  --   CHOLHDL 3.0  --   VLDL 13  --   LDLCALC 103*  --     HgbA1c:  Recent Labs  Lab 09/12/21 1745  HGBA1C 5.7*    Urine Drug Screen:  Recent Labs  Lab 09/12/21 1455  LABOPIA NONE DETECTED  COCAINSCRNUR NONE DETECTED  LABBENZ NONE DETECTED  AMPHETMU NONE DETECTED  THCU NONE DETECTED  LABBARB NONE DETECTED     Alcohol Level No results for input(s): ETH in the last 168 hours.  IMAGING past 24 hours CT HEAD WO CONTRAST (5MM)  Result Date: 09/14/2021 CLINICAL DATA:  Stroke follow-up EXAM: CT HEAD WITHOUT CONTRAST TECHNIQUE: Contiguous axial images were obtained from the  base of the skull through the vertex without intravenous contrast. RADIATION DOSE REDUCTION: This exam was performed according to the departmental dose-optimization program which includes automated exposure control, adjustment of the mA and/or kV according to patient size and/or use of iterative reconstruction technique. COMPARISON:  Brain MRI from yesterday. Flat table CT from 2 days prior FINDINGS: Brain: Subarachnoid hemorrhage along the right sylvian fissure and adjacent sulci. Maximal density material (extravasated contrast) has diffused and remaining high density is attributed to blood products which appear non progressed. Mineralization at the right occipital cortex. No parenchymal hematoma. Right cerebral swelling better seen by prior MRI. No hydrocephalus or midline shift. No masslike finding Vascular: No hyperdense vessel or unexpected calcification. Skull: Normal. Negative for fracture or focal lesion. Sinuses/Orbits: No acute finding. IMPRESSION: No progression of subarachnoid hemorrhage along the right cerebral convexity which partially obscures known right MCA territory infarcts. Electronically Signed   By: Jorje Guild M.D.   On: 09/14/2021  05:22   DG Abd Portable 1V  Result Date: 09/13/2021 CLINICAL DATA:  Enteric tube placement EXAM: PORTABLE ABDOMEN - 1 VIEW COMPARISON:  None Available. FINDINGS: Tip of enteric tube is seen in the stomach. Watchman device is noted in the region of left atrial appendage. Lower abdomen and pelvis are not included in the image. IMPRESSION: Tip of enteric tube is seen in the stomach. Electronically Signed   By: Elmer Picker M.D.   On: 09/13/2021 14:42   ECHO TEE  Result Date: 09/13/2021    TRANSESOPHOGEAL ECHO REPORT   Patient Name:   Elijah Stone Date of Exam: 09/13/2021 Medical Rec #:  [id number]    Height:       75.0 in Accession #:    [id number]   Weight:       172.0 lb Date of Birth:  [birth year]     BSA:          2.058 m Patient Age:    88 years      BP:           121/62 mmHg Patient Gender: M            HR:           72 bpm. Exam Location:  Inpatient Procedure: Transesophageal Echo, Color Doppler and 3D Echo Indications:     Watchman device evaluation                  Stroke  History:         Patient has prior history of Echocardiogram examinations, most                  recent 09/13/2021. Arrythmias:Atrial Fibrillation. S/P Watchman                  device.  Sonographer:     Clayton Lefort RDCS (AE) Referring Phys:  [id number] [place] Diagnosing Phys: Sanda Klein MD  Sonographer Comments: Echo permformed at bedside. PROCEDURE: After discussion of the risks and benefits of a TEE, an informed consent was obtained from a family member. The transesophogeal probe was passed without difficulty through the esophogus of the patient. Image quality was good. The patient developed no complications during the procedure. No additional sedation was used (patient endiotracheally intubated). IMPRESSIONS  1. Left ventricular ejection fraction, by estimation, is 25 to 30%. The left ventricle has severely decreased function. The left ventricle demonstrates global hypokinesis. The left ventricular internal cavity size was mildly dilated. Left ventricular diastolic function could not be evaluated.  2. Right ventricular systolic function is normal. The right ventricular size is normal.  3. There is a well seated Watchman left atrial appendage occluder. There is a leak at the anterolateral aspect of the device with a width of approximately 5 mm and a circumferential extent of approximately 45 degrees. There is no device associated thrombus. Left atrial size was severely dilated. No left atrial/left atrial appendage thrombus was detected.  4. Right atrial size was severely dilated.  5. The mitral valve is normal in structure. Mild mitral valve regurgitation.  6. Tricuspid valve regurgitation is mild to moderate.  7. The aortic valve is tricuspid. Aortic valve regurgitation is not  visualized. No aortic stenosis is present.  8. There is mild (Grade II) plaque involving the descending aorta. FINDINGS  Left Ventricle: Left ventricular ejection fraction, by estimation, is 25 to 30%. The left ventricle has severely decreased function. The left ventricle demonstrates global hypokinesis. The left ventricular  internal cavity size was mildly dilated. There is no left ventricular hypertrophy. Left ventricular diastolic function could not be evaluated due to atrial fibrillation. Left ventricular diastolic function could not be evaluated. Right Ventricle: The right ventricular size is normal. No increase in right ventricular wall thickness. Right ventricular systolic function is normal. Left Atrium: There is a well seated Watchman left atrial appendage occluder. There is a leak at the anterolateral aspect of the device with a width of approximately 5 mm and a circumferential extent of approximately 45 degrees. There is no device associated thrombus. Left atrial size was severely dilated. No left atrial/left atrial appendage thrombus was detected. Right Atrium: Right atrial size was severely dilated. Pericardium: There is no evidence of pericardial effusion. Mitral Valve: The mitral valve is normal in structure. Mild mitral valve regurgitation, with centrally-directed jet. Tricuspid Valve: The tricuspid valve is normal in structure. Tricuspid valve regurgitation is mild to moderate. Aortic Valve: The aortic valve is tricuspid. Aortic valve regurgitation is not visualized. No aortic stenosis is present. Pulmonic Valve: The pulmonic valve was normal in structure. Pulmonic valve regurgitation is not visualized. Aorta: The aortic root, ascending aorta and aortic arch are all structurally normal, with no evidence of dilitation or obstruction. There is mild (Grade II) plaque involving the descending aorta. IAS/Shunts: No atrial level shunt detected by color flow Doppler. Mihai Croitoru MD Electronically signed  by Sanda Klein MD Signature Date/Time: 09/13/2021/2:42:17 PM    Final     PHYSICAL EXAM  Temp:  [96.6 F (35.9 C)-98.1 F (36.7 C)] 97 F (36.1 C) (05/24 0800) Pulse Rate:  [49-87] 74 (05/24 1000) Resp:  [10-22] 16 (05/24 1000) BP: (92-145)/(36-89) 121/66 (05/24 1000) SpO2:  [87 %-100 %] 93 % (05/24 1000)  General - Well nourished, well developed pleasant elderly Caucasian male, in no apparent distress. Cardiovascular - Irregular rhythm, rate controlled in the 80s Respiratory- on room air, no distress  Mental Status -  Aox4, speech is clear. Language including naming and repetition intact, conversating appropriately Attention span and concentration were normal. Recent and remote memory were intact. Fund of Knowledge was assessed and was intact.  Cranial Nerves II - XII - II - Visual field intact OU. III, IV, VI - Extraocular movements intact. V - Facial sensation intact bilaterally. VII - Facial movement intact bilaterally. VIII - Hearing & vestibular intact bilaterally. X - Palate elevates symmetrically. XI - Chin turning & shoulder shrug intact bilaterally. XII - Tongue protrusion intact.  Motor Strength and Tone- The patient's strength was normal in all extremities and pronator drift was absent.  Bulk and tone normal and fasciculations were absent.   Sensory - Light touch, temperature/pinprick were assessed and were symmetrical.   Coordination - The patient had normal movements in the hands and feet with no ataxia or dysmetria.  Gait and Station - deferred.  ASSESSMENT/PLAN [name] is a 88 y.o. male with history of SAH, atrial fibrillation s/p Watchman procedure, CHF, pancytopenia, HTN, hypothyroidism, BPH and OSA on CPAP presenting with left facial droop, dysarthria and left arm weakness. He was found to have a right MCA M2 occlusion and was taken for mechanical thrombectomy.  This was successful.  TEE showed leaking around watchman device. Will need to initiate  oral anticoagulation in a couple of days. Plan to transfer out of ICU today.   Stroke: Right MCA infarct s/p thrombectomy likely secondary due to embolism in setting of atrial fibrillation not on anticoagulation but s/p Watchman procedure Code Stroke CT  head No acute abnormality. ASPECTS 10.    CTA head & neck mid right M2 MCA occlusion, moderate proximal left P2 PCA stenosis, mild to moderate left M2 MCA stenosis MRI  acute infarcts in right insula and right temporal lobe, SAH along right temporal and frontoparietal regions 2D Echo EF 99-37%, grade 3 diastolic dysfunction, severely dilated left atrium, mild mitral regurgitation, mild to moderate tricuspid regurgitation, no atrial level shunt TEE  Severely depressed left ventricular systolic function with global hypokinesis. Severe biatrial dilation. Relatively mild aortic atherosclerosis. There is a well seated Watchman occluder in the left atrial appendage. There is a 5 mm wide gap over a roughly 45 degree arc at the anterolateral aspect of the device. LDL 103 HgbA1c 5.7 VTE prophylaxis - SCDs    Diet   Diet clear liquid Room service appropriate? Yes; Fluid consistency: Thin   aspirin 81 mg daily prior to admission, now on No antithrombotic. Secondary to [facility] Therapy recommendations:  pending Disposition:  pending  Atrial fibrillation Patient is in permanent a-fib Stopped taking warfarin due to [facility] several years ago S/p Watchman procedure TEE shows leak around watchman device Will need oral anticoagulation  Hypertension Home meds:  none Stable Keep SBP 120-140 for 24 hours Long-term BP goal normotensive  Hyperlipidemia Home meds:  none LDL 103, goal < 70 Add rosuvastatin 20 mg daily  Continue statin at discharge  Other Stroke Risk Factors Advanced Age >/= 33  Congestive heart failure  Other Active Problems Hypothyroidism Continue home synthroid  Hospital day # 2  Patient seen and examined by NP/APP with MD. MD to  update note as needed.   Janine Ores, DNP, FNP-BC [facility] Pager: [phone]  STROKE MD NOTE :  I have personally obtained history,examined this patient, reviewed notes, independently viewed imaging studies, participated in medical decision making and plan of care.ROS completed by me personally and pertinent positives fully documented  I have made any additions or clarifications directly to the above note. Agree with note above.  Patient is doing well postextubation and breathing well.  Neurological deficits have also mostly resolved.  Follow-up CT scan from this morning's yet shows persistent subarachnoid blood and hence will hold off on anticoagulation for a few days.  Discussed with patient, wife, son and cardiology who all agree and starting Eliquis in a few days.  Patient unfortunately remains at risk for further clots and strokes as anticoagulation is on hold and they understand the risk involved.  Continue strict blood pressure control with systolic goal below 017.  Mobilize out of bed.  Therapy consults.  Consider transfer out of ICU to neurology floor bed.  Discussed with Dr. Tacy Learn critical care medicine.This patient is critically ill and at significant risk of neurological worsening, death and care requires constant monitoring of vital signs, hemodynamics,respiratory and cardiac monitoring, extensive review of multiple databases, frequent neurological assessment, discussion with family, other specialists and medical decision making of high complexity.I have made any additions or clarifications directly to the above note.This critical care time does not reflect procedure time, or teaching time or supervisory time of PA/NP/Med Resident etc but could involve care discussion time.  I spent 30 minutes of neurocritical care time  in the care of  this patient.       Antony Contras, MD Medical Director [place] Pager: [phone] 09/14/2021 12:51 PM   To  contact Stroke Continuity provider, please refer to [email or web address] After hours, contact General Neurology

## 2021-09-14 NOTE — Evaluation (Signed)
Speech Language Pathology Evaluation Patient Details Name: Elijah Stone MRN: [id number] DOB: October 08, 1930 Today's Date: 09/14/2021 Time: 0626-9485 SLP Time Calculation (min) (ACUTE ONLY): 27 min  Problem List:  Patient Active Problem List   Diagnosis Date Noted   Stroke ([place]) 09/12/2021   Middle cerebral artery embolism, right 09/12/2021   Past Medical History:  Past Medical History:  Diagnosis Date   Atrial fibrillation [facility])    Past Surgical History:  Past Surgical History:  Procedure Laterality Date   IR CT HEAD LTD  09/12/2021   IR CT HEAD LTD  09/12/2021   IR PERCUTANEOUS ART THROMBECTOMY/INFUSION INTRACRANIAL INC DIAG ANGIO  09/12/2021   RADIOLOGY WITH ANESTHESIA N/A 09/12/2021   Procedure: IR WITH ANESTHESIA;  Surgeon: Radiologist, Medication, MD;  Location: [place];  Service: Radiology;  Laterality: N/A;   HPI:  Pt is an 88 y/o male who presented to the ED with left-sided weakness. CTA showed R M2 occlusion. Pt underwent mechanical thrombectomy by IR 5/22. Post CT brain x2 showed moderate amount of contrast plus blood in the right perisylvian and the right temporal parietla subarachnoid regions. MRI brain 5/22: Acute infarcts in the right insula and right temporal lobe, with  smaller areas of infarction in the right frontal and parietal cortex, consistent with the prior right MCA occlusion. Pt remained intubated post procedure. ETT 5/22-5/23. TEE on 5/23 showed leaking watchman device. CT head 5/24: subarachnoid hemorrhage along the right cerebral convexity which partially obscures known right MCA territory infarcts. PMH: SAH in 2021, afib with watchman device, CHF, SAH, pancytopenia, HTN, hypothyroidism, BPH, OSA.   Assessment / Plan / Recommendation Clinical Impression  Pt participated in speech-language-cognition evaluation. Pt reported that at baseline he has been having difficulty with memory and that his wife manages the medications and finances. He denied any acute changes in  cognition or language, but stated that his voice has been different since extubation. Mild dysphonia noted characterized by a hoarse vocal quality with intermittent phonation breaks. The [facility] Mental Status Examination was completed to evaluate the pt's cognitive-linguistic skills. He achieved a score of 25/30 which is below the normal limits of 27 or more out of 30 and is suggestive of a mild impairment. He exhibited difficulty in the areas of memory and executive function. However, pt indicated that he believed his performance is at baseline, and it is certainly functional considering his report of support from his wife and use of compensation. Further acute skilled SLP services are not clinically indicated at this time. Pt was educated regarding results and recommendations and he verbalized understanding as well as agreement with plan of care.    SLP Assessment  SLP Recommendation/Assessment: Patient does not need any further [facility] SLP Visit Diagnosis: Cognitive communication deficit (R41.841)    Recommendations for follow up therapy are one component of a multi-disciplinary discharge planning process, led by the attending physician.  Recommendations may be updated based on patient status, additional functional criteria and insurance authorization.    Follow Up Recommendations  No SLP follow up    Assistance Recommended at Discharge  Intermittent Supervision/Assistance  Functional Status Assessment Patient has not had a recent decline in their functional status  Frequency and Duration           SLP Evaluation Cognition  Overall Cognitive Status: History of cognitive impairments - at baseline Arousal/Alertness: Awake/alert Orientation Level: Oriented X4 Year: 2023 Month: May Day of Week: Correct Attention: Focused;Sustained Focused Attention: Appears intact Sustained Attention: Appears  intact Memory: Impaired Memory Impairment: Retrieval  deficit (Immediate: 5/5 with repetition; delayed: 4/5; paragraph: 6/8) Awareness: Appears intact Problem Solving: Appears intact Executive Function: Sequencing;Organizing Sequencing:  (Clock: 2/4) Organizing: Appears intact (backward digit span: 2/2)       Comprehension  Auditory Comprehension Overall Auditory Comprehension: Appears within functional limits for tasks assessed Yes/No Questions: Within Functional Limits Commands: Within Functional Limits Conversation: Complex Reading Comprehension Reading Status: Within funtional limits    Expression Expression Primary Mode of Expression: Verbal Verbal Expression Overall Verbal Expression: Appears within functional limits for tasks assessed Initiation: No impairment Level of Generative/Spontaneous Verbalization: Conversation Repetition: No impairment Naming: No impairment Written Expression Dominant Hand: Right   Oral / Motor  Oral Motor/Sensory Function Overall Oral Motor/Sensory Function: Within functional limits Motor Speech Overall Motor Speech: Appears within functional limits for tasks assessed Respiration: Within functional limits Phonation: Hoarse Resonance: Within functional limits Articulation: Within functional limitis Intelligibility: Intelligible Motor Planning: Witnin functional limits Motor Speech Errors: Not applicable           Cagney Degrace I. Hardin Negus, [place], [place] Office number [phone] Pager [place] 09/14/2021, 3:15 PM

## 2021-09-14 NOTE — Evaluation (Signed)
Physical Therapy Evaluation Patient Details Name: Elijah Stone MRN: [id number] DOB: 19-Aug-1930 Today's Date: 09/14/2021  History of Present Illness  Pt adm 5/22 with lt sided weakness. CT showed rt M2 occlusion. Pt underwent mechanical thrombectomy by IR 5/22. Post procedure contrast extravasation vs SAH. Pt left intubated after procedure. Pt extubated 5/23. TEE on 5/23 showed leaking watchman device. PMH - afib with watchman device, SAH, CHF, hypothyroidism,  Clinical Impression  Pt presents to PT with decreased mobility due to decreased balance and generalized weakness but no significant focal weakness after rt CVA with mechanical thrombectomy. Pt has good support at home from wife and expect he will make good progress and be able to return home with home health an family.       Recommendations for follow up therapy are one component of a multi-disciplinary discharge planning process, led by the attending physician.  Recommendations may be updated based on patient status, additional functional criteria and insurance authorization.  Follow Up Recommendations Home health PT    Assistance Recommended at Discharge Intermittent Supervision/Assistance  Patient can return home with the following  A little help with walking and/or transfers;Help with stairs or ramp for entrance    Equipment Recommendations None recommended by PT  Recommendations for Other Services       Functional Status Assessment Patient has had a recent decline in their functional status and demonstrates the ability to make significant improvements in function in a reasonable and predictable amount of time.     Precautions / Restrictions Precautions Precautions: Fall      Mobility  Bed Mobility Overal bed mobility: Needs Assistance Bed Mobility: Supine to Sit     Supine to sit: Min guard, HOB elevated     General bed mobility comments: Incr time and effort    Transfers Overall transfer level: Needs  assistance Equipment used: None, Rolling walker (2 wheels) Transfers: Sit to/from Stand, Bed to chair/wheelchair/BSC Sit to Stand: Min assist, Min guard   Step pivot transfers: Min assist       General transfer comment: min assist for balance on initial stand from bed. Min guard to stand from recliner with walker with verbal cues for hand placement. Step pivot without assistive device for bed to chair with min assist for balance.    Ambulation/Gait Ambulation/Gait assistance: Min assist, Min guard Gait Distance (Feet): 150 Feet Assistive device: Rolling walker (2 wheels) Gait Pattern/deviations: Step-through pattern, Decreased stride length Gait velocity: decr Gait velocity interpretation: 1.31 - 2.62 ft/sec, indicative of limited community ambulator   General Gait Details: Min assist initially for stability. Pt with improved stability as distance improved and required only min guard for safety.  Stairs            Wheelchair Mobility    Modified Rankin (Stroke Patients Only) Modified Rankin (Stroke Patients Only) Pre-Morbid Rankin Score: No significant disability Modified Rankin: Moderately severe disability     Balance Overall balance assessment: Needs assistance Sitting-balance support: No upper extremity supported, Feet supported Sitting balance-Leahy Scale: Good     Standing balance support: No upper extremity supported, During functional activity Standing balance-Leahy Scale: Fair Standing balance comment: min assist for dynamic balance                             Pertinent Vitals/Pain Pain Assessment Pain Assessment: No/denies pain    Home Living Family/patient expects to be discharged to:: Private residence Living Arrangements: Spouse/significant other Available Help at  Discharge: Family;Available 24 hours/day Type of Home: Other(Comment) (condominium in senior living community) Home Access: Stairs to enter   Technical brewer of  Steps: 1   Home Layout: One level Home Equipment: Conservation officer, nature (2 wheels);Cane - single point;Grab bars - toilet;Grab bars - tub/shower      Prior Function Prior Level of Function : Independent/Modified Independent             Mobility Comments: Uses cane or rolling walker at times ADLs Comments: independent with bathing, dressing. Does household chores - sweeping, dishes     Hand Dominance   Dominant Hand: Right    Extremity/Trunk Assessment   Upper Extremity Assessment Upper Extremity Assessment: Defer to OT evaluation    Lower Extremity Assessment Lower Extremity Assessment: Generalized weakness       Communication   Communication: HOH  Cognition Arousal/Alertness: Awake/alert Behavior During Therapy: WFL for tasks assessed/performed Overall Cognitive Status: Within Functional Limits for tasks assessed                                          General Comments General comments (skin integrity, edema, etc.): VSS on RA    Exercises     Assessment/Plan    PT Assessment Patient needs continued PT services  PT Problem List Decreased strength;Decreased balance;Decreased mobility       PT Treatment Interventions DME instruction;Gait training;Stair training;Functional mobility training;Therapeutic activities;Therapeutic exercise;Balance training;Patient/family education    PT Goals (Current goals can be found in the Care Plan section)  Acute Rehab PT Goals Patient Stated Goal: return home PT Goal Formulation: With patient/family Time For Goal Achievement: 09/21/21 Potential to Achieve Goals: Good    Frequency Min 4X/week     Co-evaluation               AM-PAC PT "6 Clicks" Mobility  Outcome Measure Help needed turning from your back to your side while in a flat bed without using bedrails?: None Help needed moving from lying on your back to sitting on the side of a flat bed without using bedrails?: A Little Help needed moving  to and from a bed to a chair (including a wheelchair)?: A Little Help needed standing up from a chair using your arms (e.g., wheelchair or bedside chair)?: A Little Help needed to walk in hospital room?: A Little Help needed climbing 3-5 steps with a railing? : A Little 6 Click Score: 19    End of Session Equipment Utilized During Treatment: Gait belt Activity Tolerance: Patient tolerated treatment well Patient left: in chair;with call bell/phone within reach;with chair alarm set;with family/visitor present Nurse Communication: Mobility status PT Visit Diagnosis: Other abnormalities of gait and mobility (R26.89)    Time: 0370-4888 PT Time Calculation (min) (ACUTE ONLY): 37 min   Charges:   PT Evaluation $PT Eval Moderate Complexity: 1 Mod PT Treatments $Gait Training: 8-22 mins        [place] Office [place] 09/14/2021, 1:07 PM

## 2021-09-14 NOTE — TOC Benefit Eligibility Note (Signed)
Patient Teacher, English as a foreign language completed.    The patient is currently admitted and upon discharge could be taking Eliquis 5 mg.  The current 30 day co-pay is, $43.50.   The patient is currently admitted and upon discharge could be taking Xarelto 20 mg.  The current 30 day co-pay is, $43.50.   The patient is insured through [place] Patient Advocate Specialist [place] Patient Advocate Team Direct Number: [phone]  Fax: [phone]

## 2021-09-14 NOTE — Progress Notes (Signed)
NAME:  Elijah Stone, [place] MRN:  [id number], DOB:  08/18/1930, LOS: 2 ADMISSION DATE:  09/12/2021, CONSULTATION DATE:  09/12/21 REFERRING MD:  Dr. Estanislado Pandy, CHIEF COMPLAINT:  Left facial droop   History of Present Illness:  88 y/o M who presented to [facility] on 5/22 with reports of left sided facial droop, food falling out of his mouth during lunch and leaning to the left.    The patient presented from home. He was eating lunch with his wife when she noted him to have food falling out of his mouth, dysarthria, left facial droop and leaning to the left. He was last known well at 88  EMS was activated to the home as a CODE STROKE.  Initial work up included a UDS which was negative, UA negative, COVID/influenza screening pending. EKG showed atrial fibrillation with IVCD.  Initial labs - Na 137, K 3.9, Cl 104, glucose 139, BUN 16, Sr Cr 1.07, WBC 4.7, Hgb 12, MVC 100.3, and platelets 126.  CT Code stroke showed no evidence of acute abnormality. CTA head showed mid right M2 MCA occlusion within the sylvian fissure, moderate proximal left P PCA stenosis, mild to moderate left M2 MCA stenosis.  The patient was taken emergently to Neuro IR for revascularization. He was not a candidate for tPA given recent SAH.  He underwent a right common carotid arteriogram with findings of occluded M2 M3 region of the inferior division of the right middle cerebral artery.  Dr. Estanislado Pandy performed revascularization of the occluded right MCA with 1 pass with 4x62m retrieval device and contact aspiration achieving a TICI revascularization.  Post CT brain x2 showed moderate amount of contrast plus blood in the right perisylvian and the right temporal parietla subarachnoid regions.  The patient remained intubated post procedure and was returned to ICU on mechanical ventilation.   PCCM consulted for post procedure ICU care.   Pertinent  Medical History  Atrial Fibrillation s/p Watchman on ASA, NICM/ HFrEF, SAH, pancytopenia, HTN,  hypothyroidism, BPH, OSA on CPAP  Significant Hospital Events: Including procedures, antibiotic start and stop dates in addition to other pertinent events   5/22 Admit with left facial droop, work up consistent with CVA, left intubated on cleviprex and propofol gtt 5/23 MRI, off cleviprex, TEE, extubated   Interim History / Subjective:  Extubated yesterday afternoon and passed swallow eval last evening.  Tolerating clear liquids.    Repeat CTH this am> stable SAH  Patient with no complaints other than being hungry.  Wife remains at bedside.   Objective   Blood pressure 121/74, pulse 67, temperature 97.7 F (36.5 C), temperature source Axillary, resp. rate 20, height '6\' 3"'$  (1.905 m), weight 78 kg, SpO2 98 %.    Vent Mode: PRVC FiO2 (%):  [40 %] 40 % Set Rate:  [12 bmp] 12 bmp Vt Set:  [640 mL] 640 mL PEEP:  [5 cmH20] 5 cmH20 Pressure Support:  [5 cmH20] 5 cmH20   Intake/Output Summary (Last 24 hours) at 09/14/2021 0745 Last data filed at 09/14/2021 0600 Gross per 24 hour  Intake 1791.63 ml  Output 1600 ml  Net 191.63 ml   Filed Weights   09/12/21 1500  Weight: 78 kg    Examination: Prefers to go by "[employer] General:  Very pleasant elderly male sitting upright in bed in NAD HEENT: MM pink/moist, HOH> hearing aids back in, pupils 3/reactive, speech slightly slowed but clear Neuro: alert, oriented x3, MAE, neg drift, denies any numbness/ tingling CV: irir, right groin site  wnl, pulses remain the same in LE> intermittently palpable/ doppler on R DP and lightly palpable on L DP, PT remain strong PULM:  non labored, on room air, CTA GI: soft, bs+, NT, voids  Extremities: warm/dry, no LE edema  Skin: no rashes   5/23 MRI brain>  1. Acute infarcts in the right insula and right temporal lobe, with smaller areas of infarction in the right frontal and parietal cortex, consistent with the prior right MCA occlusion. The areas of infarction in the right insula, right temporal lobe and  right parietal lobe demonstrate petechial hemorrhage. No mass effect or midline shift. 2. Subarachnoid hemorrhage along the right temporal and frontoparietal regions. 3. Trace susceptibility artifact in the occipital horns of the lateral ventricles, likely hemorrhage, but of unknown acuity. No hydrocephalus.  5/24 CTH> no progression of SAH along right cerebral convexity which partially obscures known R MCA territory infarcts  Resolved Hospital Problem list    Assessment & Plan:   Acute R M2/ M3 CVA s/p mechanical thrombectomy with TICI - post procedure CTH with moderate amount of contrast plus blood in R perisylvian and right temporal parietal subarachnoid regions  P:  - per Neuro and NIR - serial neuro exams - ASA/ statin per neuro - TEE > LVEF 25-30%, LV global hypokinesis, DD not evaluated, normal RV, watchman present with 81m anterolateral leak> no associated thrombus, L/ R atrial severely dilated, GII plaque of descending aorta  - TTE > EF 30-35%, G3DD, mild to mod MR, mild to moderate TR/ A1c 5.7/ lipid panel> LDL 103  - PT/ OT - pending LE art duplex given weak L DP and intermittent R DP by doppler, strong PT, foot still perfused      Acute respiratory insufficiency post procedure  OSA on CPAP - chronic stable ILD changes on CXR P:  - extubated 5/23, remains on room air and passed bedside swallow eval - continue aggressive pulm hygiene, IS, PT - CPAP prn q HS    NICM/ HFrEF - 03/30/21 prior EF 35-40%, mild to moderately reduced RVSF HTN - [facility]:  - TTE/ TEE as above - stop MIVF now taking orals   Afib  - watchman in place 2017 and [facility]  - tele monitoring  - remains rate controlled  - likely to add back home entresto/ toprol per neuro.  SBP remains 120-130s - TEE noted 514mleak, no thrombus.  Any leak >3>9TOan cause embolic events.  Ideally would need AC but [facility], will be  further risk/ benefit discussion between primary team and patient - goal K> 4, Mag> 2     Macrocytic anemia Thrombocytopenia, chronic  - hx of pancytopenia dating back to 2013, prior evaluated by heme, felt early MDS, stable w/active surveillance  - baseline plts ~112k P:  - remains stable     Hypothyroidism - TSH 0.212, previously 1.36 04/2021 - cont home synthroid  Best Practice (right click and "Reselect all SmartList Selections" daily)   Diet/type: Regular consistency (see orders) advancing as tolerated DVT prophylaxis: SCD GI prophylaxis: N/A Lines: N/A Foley:  N/A Code Status:  full code Last date of multidisciplinary goals of care discussion [per primary team]  Nothing further to add.  PCCM will sign off.  Please call [email or web address] if we can be of any further assistance.   Labs   CBC: Recent Labs  Lab 09/12/21 1459 09/12/21 1506 09/12/21 1744  09/13/21 0540  WBC 4.7  --   --  7.3  NEUTROABS 2.0  --   --  6.3  HGB 12.0* 13.3 10.5* 10.8*  HCT 36.3* 39.0 31.0* 32.6*  MCV 100.3*  --   --  97.9  PLT 126*  --   --  118*    Basic Metabolic Panel: Recent Labs  Lab 09/12/21 1459 09/12/21 1506 09/12/21 1744 09/13/21 0540 09/14/21 0120  NA 137 140 141 140 140  K 3.9 4.1 4.2 4.3 4.4  CL 104 102  --  111 112*  CO2 26  --   --  22 21*  GLUCOSE 139* 139*  --  149* 123*  BUN 16 20  --  17 20  CREATININE 1.07 1.00  --  1.09 0.93  CALCIUM 9.2  --   --  8.8* 8.8*  MG  --   --   --   --  2.0  PHOS  --   --   --   --  3.5   GFR: Estimated Creatinine Clearance: 59.4 mL/min (by C-G formula based on SCr of 0.93 mg/dL). Recent Labs  Lab 09/12/21 1459 09/13/21 0540  WBC 4.7 7.3    Liver Function Tests: Recent Labs  Lab 09/12/21 1459 09/13/21 0540 09/14/21 0120  AST 19 29  --   ALT 17 22  --   ALKPHOS 82 77  --   BILITOT 0.7 0.6  --   PROT 6.8 6.1*  --   ALBUMIN 4.0 3.3* 3.5   No results for input(s): LIPASE, AMYLASE in the last 168 hours. No results for  input(s): AMMONIA in the last 168 hours.  ABG    Component Value Date/Time   PHART 7.501 (H) 09/12/2021 1744   PCO2ART 35.1 09/12/2021 1744   PO2ART 224 (H) 09/12/2021 1744   HCO3 27.5 09/12/2021 1744   TCO2 29 09/12/2021 1744   O2SAT 100 09/12/2021 1744     Coagulation Profile: Recent Labs  Lab 09/12/21 1459  INR 1.0    Cardiac Enzymes: No results for input(s): CKTOTAL, CKMB, CKMBINDEX, TROPONINI in the last 168 hours.  HbA1C: Hgb A1c MFr Bld  Date/Time Value Ref Range Status  09/12/2021 05:45 PM 5.7 (H) 4.8 - 5.6 % Final    Comment:    (NOTE) Pre diabetes:          5.7%-6.4%  Diabetes:              >6.4%  Glycemic control for   <7.0% adults with diabetes     CBG: Recent Labs  Lab 09/13/21 1616 09/13/21 1940 09/13/21 2348 09/14/21 0332 09/14/21 [place], ACNP [facility] 09/14/2021, 7:45 AM  See Amion for pager If no response to pager, please call PCCM consult pager After 7:00 pm call Elink

## 2021-09-14 NOTE — Evaluation (Signed)
Occupational Therapy Evaluation Patient Details Name: Elijah Stone MRN: [id number] DOB: Jun 07, 1930 Today's Date: 09/14/2021   History of Present Illness Pt adm 5/22 with lt sided weakness. CT showed rt M2 occlusion. Pt underwent mechanical thrombectomy by IR 5/22. Post procedure contrast extravasation vs SAH. Pt left intubated after procedure. Pt extubated 5/23. TEE on 5/23 showed leaking watchman device. MRI + Acute infarcts in the right insula and right temporal lobe, with  smaller areas of infarction in the right frontal and parietal  cortex. PMH - afib with watchman device, SAH, CHF, hypothyroidism,   Clinical Impression   PTA pt lives independently with his wife in a Senior living Apt. Pt uses a cane for mobility, does chores around the house and is responsible for his medication and financial management, but does not drive. Recommend follow up with HHOT to maximize functional level of independence and reduce risk of falls. Pt in agreement. Acute OT will follow.      Recommendations for follow up therapy are one component of a multi-disciplinary discharge planning process, led by the attending physician.  Recommendations may be updated based on patient status, additional functional criteria and insurance authorization.   Follow Up Recommendations  Home health OT    Assistance Recommended at Discharge Intermittent Supervision/Assistance  Patient can return home with the following Assist for transportation;Assistance with cooking/housework;Direct supervision/assist for medications management;Direct supervision/assist for financial management;A lot of help with bathing/dressing/bathroom    Functional Status Assessment  Patient has had a recent decline in their functional status and demonstrates the ability to make significant improvements in function in a reasonable and predictable amount of time.  Equipment Recommendations  BSC/3in1 (to use as shower chair)    Recommendations for Other  Services       Precautions / Restrictions Precautions Precautions: Fall      Mobility Bed Mobility               General bed mobility comments: OOB in chair    Transfers Overall transfer level: Needs assistance Equipment used: None, Rolling walker (2 wheels) Transfers: Sit to/from Stand, Bed to chair/wheelchair/BSC Sit to Stand: Min assist, Min guard     Step pivot transfers: Min assist     General transfer comment: min assist for balance on initial stand from bed. Min guard to stand from recliner with walker with verbal cues for hand placement. Step pivot without assistive device for bed to chair with min assist for balance.      Balance Overall balance assessment: Needs assistance Sitting-balance support: No upper extremity supported, Feet supported Sitting balance-Leahy Scale: Good     Standing balance support: No upper extremity supported, During functional activity Standing balance-Leahy Scale: Fair Standing balance comment: min assist for dynamic balance                           ADL either performed or assessed with clinical judgement   ADL Overall ADL's : Needs assistance/impaired                                     Functional mobility during ADLs: Min guard General ADL Comments: requires Min guard A with LB ADL due to mild unsteadiness     Vision Baseline Vision/History: 1 Wears glasses;3 Glaucoma Vision Assessment?: Yes Additional Comments: reports decreased peripheral vision however vision appears functional; Did observe pt coming close and almost hitting  cmoputer table on L     Perception Perception Comments: ? mild L inattention when distracted   Praxis      Pertinent Vitals/Pain Pain Assessment Pain Assessment: No/denies pain     Hand Dominance Right   Extremity/Trunk Assessment Upper Extremity Assessment Upper Extremity Assessment: Overall WFL for tasks assessed (R shoulder deficits at baseline)    Lower Extremity Assessment Lower Extremity Assessment: Defer to PT evaluation   Cervical / Trunk Assessment Cervical / Trunk Assessment: Normal   Communication Communication Communication: HOH   Cognition Arousal/Alertness: Awake/alert Behavior During Therapy: WFL for tasks assessed/performed Overall Cognitive Status: Within Functional Limits for tasks assessed (scored a 0 on the Short Blessed Test which places him in the "nomal" range)                                       General Comments  VSS on RA    Exercises     Shoulder Instructions      Home Living Family/patient expects to be discharged to:: Private residence Living Arrangements: Spouse/significant other Available Help at Discharge: Family;Available 24 hours/day Type of Home: Other(Comment) (condominium in senior living community) Home Access: Stairs to enter Technical brewer of Steps: 1   Home Layout: One level     Bathroom Shower/Tub: Occupational psychologist: Handicapped height Bathroom Accessibility: Yes How Accessible: Accessible via walker Home Equipment: Conservation officer, nature (2 wheels);Cane - single point;Grab bars - toilet;Grab bars - tub/shower          Prior Functioning/Environment Prior Level of Function : Independent/Modified Independent             Mobility Comments: Uses cane or rolling walker at times ADLs Comments: independent with bathing, dressing. Does household chores - sweeping, dishes; does not drive; does his own Psychiatric nurse        OT Problem List: Decreased activity tolerance;Decreased safety awareness;Decreased knowledge of use of DME or AE      OT Treatment/Interventions: Self-care/ADL training;DME and/or AE instruction;Therapeutic activities;Visual/perceptual remediation/compensation;Patient/family education;Balance training    OT Goals(Current goals can be found in the care plan section) Acute Rehab OT Goals Patient  Stated Goal: to go home and be independent OT Goal Formulation: With patient Time For Goal Achievement: 09/28/21 Potential to Achieve Goals: Good  OT Frequency: Min 2X/week    Co-evaluation              AM-PAC OT "6 Clicks" Daily Activity     Outcome Measure Help from another person eating meals?: None Help from another person taking care of personal grooming?: A Little Help from another person toileting, which includes using toliet, bedpan, or urinal?: A Little Help from another person bathing (including washing, rinsing, drying)?: A Little Help from another person to put on and taking off regular upper body clothing?: A Little Help from another person to put on and taking off regular lower body clothing?: A Little 6 Click Score: 19   End of Session Equipment Utilized During Treatment: Gait belt;Rolling walker (2 wheels) Nurse Communication: Mobility status;Other (comment) (DC needs)  Activity Tolerance: Patient tolerated treatment well Patient left: in chair;with call bell/phone within reach;with chair alarm set  OT Visit Diagnosis: Unsteadiness on feet (R26.81);Low vision, both eyes (H54.2)                Time: 1459-1531 OT Time Calculation (min): 32 min Charges:  OT  General Charges $OT Visit: 1 Visit OT Evaluation $OT Eval Moderate Complexity: 1 Mod OT Treatments $Self Care/Home Management : 8-22 mins  Maurie Boettcher, OT/L   Acute OT Clinical Specialist Acute Rehabilitation Services Pager [phone] Office [phone]   [facility] 09/14/2021, 3:50 PM

## 2021-09-14 NOTE — Progress Notes (Signed)
Report  called to Carmela Rima, RN. Pt transferred to 3W 7 via wheelchair. Wife at bedside.

## 2021-09-15 ENCOUNTER — Inpatient Hospital Stay (HOSPITAL_COMMUNITY): Payer: Medicare Other

## 2021-09-15 DIAGNOSIS — R0989 Other specified symptoms and signs involving the circulatory and respiratory systems: Secondary | ICD-10-CM

## 2021-09-15 DIAGNOSIS — I63311 Cerebral infarction due to thrombosis of right middle cerebral artery: Secondary | ICD-10-CM | POA: Diagnosis not present

## 2021-09-15 LAB — GLUCOSE, CAPILLARY
Glucose-Capillary: 91 mg/dL (ref 70–99)
Glucose-Capillary: 92 mg/dL (ref 70–99)
Glucose-Capillary: 81 mg/dL (ref 70–99)

## 2021-09-15 MED ORDER — CYCLOSPORINE 0.05 % OP EMUL
1.0000 [drp] | Freq: Two times a day (BID) | OPHTHALMIC | Status: DC
  Administered 2021-09-15: 1 [drp] via OPHTHALMIC
  Filled 2021-09-15 (×3): qty 30

## 2021-09-15 MED ORDER — SODIUM CHLORIDE (HYPERTONIC) 2 % OP SOLN
1.0000 [drp] | Freq: Two times a day (BID) | OPHTHALMIC | Status: DC
  Filled 2021-09-15: qty 15

## 2021-09-15 MED ORDER — ROSUVASTATIN CALCIUM 20 MG PO TABS: 20.0000 mg | ORAL_TABLET | Freq: Every day | ORAL | 1 refills | Status: AC

## 2021-09-15 MED ORDER — NAPHAZOLINE-GLYCERIN 0.012-0.25 % OP SOLN
1.0000 [drp] | Freq: Two times a day (BID) | OPHTHALMIC | Status: DC
  Administered 2021-09-15: 1 [drp] via OPHTHALMIC
  Filled 2021-09-15: qty 15

## 2021-09-15 MED ORDER — ASPIRIN 81 MG PO TBEC
81.0000 mg | DELAYED_RELEASE_TABLET | Freq: Every day | ORAL | Status: DC
  Administered 2021-09-15: 81 mg via ORAL
  Filled 2021-09-15: qty 1

## 2021-09-15 NOTE — TOC Transition Note (Signed)
Transition of Care [facility]) - CM/SW Discharge Note   Patient Details  Name: Elijah Stone MRN: [id number] Date of Birth: [birth year]  [facility]) CM/SW Contact:  Pollie Friar, RN Phone Number: 09/15/2021, 10:50 AM   Clinical Narrative:    Patient is discharging home with home health services through [place]. Information on the AVS. 3 in 1 for home to be delivered to the patients room per [employer].  Pt has needed supervision at home.  Wife provides needed transportation. Pt manages his own medications but wife oversees.  Pt has transportation home today.    Final next level of care: Home w Home Health Services Barriers to Discharge: No Barriers Identified   Patient Goals and CMS Choice   CMS Medicare.gov Compare Post Acute Care list provided to:: Patient Represenative (must comment) Choice offered to / list presented to : Spouse, Adult Children  Discharge Placement                       Discharge Plan and Services                DME Arranged: 3-N-1 DME Agency: [employer] Date DME Agency Contacted: 09/15/21   Representative spoke with at DME Agency: Jamal Maes HH Arranged: PT, OT, Speech Therapy [place] Agency: [place] Date [place]: 09/15/21   Representative spoke with at [place]: [place] ([place]) Interventions     Readmission Risk Interventions     View : No data to display.

## 2021-09-15 NOTE — Discharge Summary (Addendum)
Stroke Discharge Summary  Patient ID: Elijah Stone   MRN: [id number]      DOB: 1930/06/08  Date of Admission: 09/12/2021 Date of Discharge: 09/15/2021  Attending Physician:  Antony Contras MD Consultant(s):    cardiology  Patient's PCP:  Pcp, No  DISCHARGE DIAGNOSIS: Right MCA infarct s/p thrombectomy likely secondary due to embolism in setting of atrial fibrillation not on anticoagulation but s/p Watchman procedure Principal Problem:   Stroke [facility]) Active Problems:   Middle cerebral artery embolism, right Chronic atrial fibrillation Cardiomyopathy Mild hyperlipidemia   Allergies as of 09/15/2021   No Known Allergies      Medication List     TAKE these medications    ACETAMIN PO Take 1 tablet by mouth 2 (two) times daily as needed (mild pain).   aspirin EC 81 MG tablet Take 81 mg by mouth daily. Swallow whole.   cholecalciferol 25 MCG (1000 UNIT) tablet Commonly known as: VITAMIN D3 Take 1,000 Units by mouth daily.   cycloSPORINE 0.05 % ophthalmic emulsion Commonly known as: RESTASIS Place 1 drop into both eyes 2 (two) times daily.   Entresto 24-26 MG Generic drug: sacubitril-valsartan Take 1 tablet by mouth at bedtime.   fluocinonide cream 0.05 % Commonly known as: LIDEX Apply 1 application. topically 2 (two) times daily as needed (rash).   fluticasone 50 MCG/ACT nasal spray Commonly known as: FLONASE Place 1 spray into both nostrils daily.   latanoprost 0.005 % ophthalmic solution Commonly known as: XALATAN Place 1 drop into both eyes at bedtime.   levocetirizine 5 MG tablet Commonly known as: XYZAL Take 5 mg by mouth daily as needed for allergies.   levothyroxine 88 MCG tablet Commonly known as: SYNTHROID Take 88 mcg by mouth daily before breakfast.   MiraLax 17 GM/SCOOP powder Generic drug: polyethylene glycol powder Take 17 g by mouth daily.   mupirocin ointment 2 % Commonly known as: BACTROBAN Apply 1 application. topically 2 (two) times  daily as needed (rash).   MURO 128 OP Apply 1 drop to eye 2 (two) times daily.   omeprazole 40 MG capsule Commonly known as: PRILOSEC Take 40 mg by mouth daily.   REFRESH OP Place 1 drop into both eyes in the morning and at bedtime.   rosuvastatin 20 MG tablet Commonly known as: CRESTOR Take 1 tablet (20 mg total) by mouth daily. Start taking on: Sep 16, 2021               Durable Medical Equipment  (From admission, onward)           Start     Ordered   09/15/21 1040  For home use only DME 3 n 1  Once        09/15/21 1039            LABORATORY STUDIES CBC    Component Value Date/Time   WBC 7.3 09/13/2021 0540   RBC 3.33 (L) 09/13/2021 0540   HGB 10.8 (L) 09/13/2021 0540   HCT 32.6 (L) 09/13/2021 0540   PLT 118 (L) 09/13/2021 0540   MCV 97.9 09/13/2021 0540   MCH 32.4 09/13/2021 0540   MCHC 33.1 09/13/2021 0540   RDW 13.8 09/13/2021 0540   LYMPHSABS 0.6 (L) 09/13/2021 0540   MONOABS 0.3 09/13/2021 0540   EOSABS 0.0 09/13/2021 0540   BASOSABS 0.0 09/13/2021 0540   CMP    Component Value Date/Time   NA 140 09/14/2021 0120   K 4.4 09/14/2021 0120  CL 112 (H) 09/14/2021 0120   CO2 21 (L) 09/14/2021 0120   GLUCOSE 123 (H) 09/14/2021 0120   BUN 20 09/14/2021 0120   CREATININE 0.93 09/14/2021 0120   CALCIUM 8.8 (L) 09/14/2021 0120   PROT 6.1 (L) 09/13/2021 0540   ALBUMIN 3.5 09/14/2021 0120   AST 29 09/13/2021 0540   ALT 22 09/13/2021 0540   ALKPHOS 77 09/13/2021 0540   BILITOT 0.6 09/13/2021 0540   GFRNONAA >60 09/14/2021 0120   COAGS Lab Results  Component Value Date   INR 1.0 09/12/2021   Lipid Panel    Component Value Date/Time   CHOL 175 09/12/2021 1745   TRIG 37 09/13/2021 0540   HDL 59 09/12/2021 1745   CHOLHDL 3.0 09/12/2021 1745   VLDL 13 09/12/2021 1745   LDLCALC 103 (H) 09/12/2021 1745   HgbA1C  Lab Results  Component Value Date   HGBA1C 5.7 (H) 09/12/2021   Urinalysis    Component Value Date/Time   COLORURINE  STRAW (A) 09/12/2021 1455   APPEARANCEUR CLEAR 09/12/2021 1455   LABSPEC 1.016 09/12/2021 1455   PHURINE 7.0 09/12/2021 1455   GLUCOSEU NEGATIVE 09/12/2021 1455   HGBUR NEGATIVE 09/12/2021 1455   BILIRUBINUR NEGATIVE 09/12/2021 1455   KETONESUR NEGATIVE 09/12/2021 1455   PROTEINUR NEGATIVE 09/12/2021 1455   NITRITE NEGATIVE 09/12/2021 1455   LEUKOCYTESUR NEGATIVE 09/12/2021 1455   Urine Drug Screen     Component Value Date/Time   LABOPIA NONE DETECTED 09/12/2021 1455   COCAINSCRNUR NONE DETECTED 09/12/2021 1455   LABBENZ NONE DETECTED 09/12/2021 1455   AMPHETMU NONE DETECTED 09/12/2021 1455   THCU NONE DETECTED 09/12/2021 1455   LABBARB NONE DETECTED 09/12/2021 1455    Alcohol Level No results found for: ETH   SIGNIFICANT DIAGNOSTIC STUDIES CT HEAD WO CONTRAST (5MM)  Result Date: 09/14/2021 CLINICAL DATA:  Stroke follow-up EXAM: CT HEAD WITHOUT CONTRAST TECHNIQUE: Contiguous axial images were obtained from the base of the skull through the vertex without intravenous contrast. RADIATION DOSE REDUCTION: This exam was performed according to the departmental dose-optimization program which includes automated exposure control, adjustment of the mA and/or kV according to patient size and/or use of iterative reconstruction technique. COMPARISON:  Brain MRI from yesterday. Flat table CT from 2 days prior FINDINGS: Brain: Subarachnoid hemorrhage along the right sylvian fissure and adjacent sulci. Maximal density material (extravasated contrast) has diffused and remaining high density is attributed to blood products which appear non progressed. Mineralization at the right occipital cortex. No parenchymal hematoma. Right cerebral swelling better seen by prior MRI. No hydrocephalus or midline shift. No masslike finding Vascular: No hyperdense vessel or unexpected calcification. Skull: Normal. Negative for fracture or focal lesion. Sinuses/Orbits: No acute finding. IMPRESSION: No progression of  subarachnoid hemorrhage along the right cerebral convexity which partially obscures known right MCA territory infarcts. Electronically Signed   By: Jorje Guild M.D.   On: 09/14/2021 05:22   MR BRAIN WO CONTRAST  Result Date: 09/13/2021 CLINICAL DATA:  New onset left-sided weakness, left facial droop, dysarthria, and neglect EXAM: MRI HEAD WITHOUT CONTRAST TECHNIQUE: Multiplanar, multiecho pulse sequences of the brain and surrounding structures were obtained without intravenous contrast. COMPARISON:  None Available. FINDINGS: Brain: Restricted diffusion with ADC correlates in the right MCA distribution, most prominently involving the insula insula and right temporal lobe (series 5, image 89-91), with smaller areas of restricted diffusion in the right frontal (series 7, image 71) and right parietal lobes (series 5, images 97-100 and series 7, images 57-58). Petechial  hemorrhage is associated with the right insular, right temporal, and right parietal areas of infarction, with additional subarachnoid hemorrhage along the right temporal and frontoparietal lobes. No mass, mass effect, or midline shift. Trace susceptibility artifact is seen layering in the occipital horns of the lateral ventricles (series 14, image 25), likely hemorrhage, which is technically age indeterminate. No hydrocephalus. Scattered punctate foci of susceptibility in the bilateral cerebral hemispheres, likely represent sequela of prior microhemorrhages, with larger foci of hemosiderin deposition in the left parietal and right occipital lobes likely the sequela of small hemorrhagic infarcts. Vascular: Normal arterial flow voids. Skull and upper cervical spine: Normal marrow signal. Sinuses/Orbits: Mucosal thickening in the right maxillary sinus and ethmoid air cells. Status post bilateral lens replacements. Other: The mastoids are well aerated. IMPRESSION: 1. Acute infarcts in the right insula and right temporal lobe, with smaller areas of  infarction in the right frontal and parietal cortex, consistent with the prior right MCA occlusion. The areas of infarction in the right insula, right temporal lobe and right parietal lobe demonstrate petechial hemorrhage. No mass effect or midline shift. 2. Subarachnoid hemorrhage along the right temporal and frontoparietal regions. 3. Trace susceptibility artifact in the occipital horns of the lateral ventricles, likely hemorrhage, but of unknown acuity. No hydrocephalus. These results will be called to the ordering clinician or representative by the Radiologist Assistant, and communication documented in the PACS or [employer]. Electronically Signed   By: Merilyn Baba M.D.   On: 09/13/2021 00:39   IR CT Head Ltd  Result Date: 09/14/2021 INDICATION: New onset severe dysarthria, right-sided weakness and right-sided neglect. Occluded mid to distal M2 segment of the inferior division of the right middle cerebral artery. EXAM: 1. EMERGENT LARGE VESSEL OCCLUSION THROMBOLYSIS (anterior CIRCULATION) COMPARISON:  CT angiogram of the head and neck of 09/12/2021. MEDICATIONS: No antibiotic was administered within 1 hour of the procedure. ANESTHESIA/SEDATION: General anesthesia. CONTRAST:  Omnipaque 300 approximately 60 mL. FLUOROSCOPY TIME:  Fluoroscopy Time: 17 minutes 42 seconds (1135 mGy). COMPLICATIONS: None immediate. TECHNIQUE: Following a full explanation of the procedure along with the potential associated complications, an informed witnessed consent was obtained. The risks of intracranial hemorrhage of 10%, worsening neurological deficit, ventilator dependency, death and inability to revascularize were all reviewed in detail with the patient's spouse. The patient was then put under general anesthesia by the Department of Anesthesiology at [facility]. The right groin was prepped and draped in the usual sterile fashion. Thereafter using modified Seldinger technique, transfemoral access into the  right common femoral artery was obtained without difficulty. Over a 0.035 inch guidewire an 8 French 25 cm Pinnacle sheath was inserted. Through this, and also over a 0.035 inch guidewire a combination of Simmons 2 6 French catheter inside of an 8 [place] 087 95 cm balloon guide catheter was advanced and positioned in the right common carotid artery. The guidewire and support catheter were removed. Good aspiration obtained from the hub of the balloon guide catheter. Gentle control arteriogram performed through the balloon guide catheter demonstrated no evidence of spasms, dissections or of intraluminal filling defects. Right common carotid arteriogram was then performed. FINDINGS: The right common carotid arteriogram demonstrates the right external carotid artery and its major branches to be widely patent. The right internal carotid artery at the bulb to the cranial skull base demonstrates wide patency. The petrous, the cavernous and the supraclinoid segments demonstrate wide patency as well. The right middle cerebral artery and the right anterior cerebral artery opacify into  the capillary and venous phases. Angiographic occlusion is noted of the distal M2 segment of the inferior division of the right middle cerebral artery. Transient cross-filling via the anterior communicating artery of the left anterior cerebral A2 segment and distally is evident. PROCEDURE: Through the balloon guide catheter now positioned in the distal cervical right ICA, a combination of an 055 132 cm Zoom aspiration catheter inside of which was an 021 160 cm microcatheter was advanced over a 0.014 inch soft tip Aristotle micro guidewire to the supraclinoid right ICA. Using a torque device, the micro guidewire was advanced through the occluded inferior division into the M2 M3 region followed by the microcatheter. The guidewire was removed. Free aspiration was obtained. A gentle control arteriogram performed through the microcatheter  demonstrated safe positioning of the tip of the microcatheter which was then connected to continuous heparinized saline infusion. A 4 mm x 40 mm Solitaire X retrieval device was then advanced to the distal end of the microcatheter and deployed by retrieving the microcatheter while gently holding the micro guidewire. At this time the 055 Zoom aspiration catheter was advanced into the inferior division of the left middle cerebral artery just proximal to the occluded segment. With proximal flow arrest, and constant aspiration applied with a 20 mL syringe at the hub of the balloon guide catheter, and at the hub of the aspiration catheter with the aspiration device for approximately 2-1/2 minutes, the combination of the retrieval device, the microcatheter, and the Zoom aspiration catheter were retrieved and removed. Pieces of clot were noted in the canister, and also in the interstices of the retrieval device. Control arteriogram performed following reversal of flow arrest in the right internal carotid artery demonstrated complete revascularization of the treated inferior division with a TICI 3 revascularization. A 3 minute delayed arteriogram performed through the right internal carotid artery demonstrated continued patency of the right middle cerebral artery distribution. The native non DSA images demonstrated stagnation of contrast in the posterior perisylvian subarachnoid region overlying the angular gyrus. A flat panel CT of the brain demonstrated a mixture of contrast and blood in the right perisylvian fissure, extending posteriorly and slightly superiorly into the temporoparietal regions. The patient's hemodynamic status remained stable. A repeat angiogram performed 10 minutes later demonstrated no worsening of the contrast stasis. A repeat CT of the brain performed at the end of the procedure continued to demonstrate no change in the mixture of contrast and presumably subarachnoid hemorrhage. The balloon guide  catheter was retrieved and removed. An 8 [place] Angio-Seal was applied for hemostasis at the right groin puncture site. Distal pulses remained Dopplerable with no change in the non Dopplerable right dorsalis pedis. Patient was left intubated due to patient's neurologic condition. He was then transferred to the neuro ICU for post revascularization care. IMPRESSION: Status post endovascular complete revascularization of occluded prominent inferior division of the right middle cerebral artery in the distal M2 M3 region with 1 pass with a 4 mm x 40 mm Solitaire X retrieval device and contact aspiration achieving a TICI 3 revascularization. PLAN: As per referring MD. Electronically Signed   By: Luanne Bras M.D.   On: 09/14/2021 14:45   IR CT Head Ltd  Result Date: 09/14/2021 INDICATION: New onset severe dysarthria, right-sided weakness and right-sided neglect. Occluded mid to distal M2 segment of the inferior division of the right middle cerebral artery. EXAM: 1. EMERGENT LARGE VESSEL OCCLUSION THROMBOLYSIS (anterior CIRCULATION) COMPARISON:  CT angiogram of the head and neck of 09/12/2021. MEDICATIONS:  No antibiotic was administered within 1 hour of the procedure. ANESTHESIA/SEDATION: General anesthesia. CONTRAST:  Omnipaque 300 approximately 60 mL. FLUOROSCOPY TIME:  Fluoroscopy Time: 17 minutes 42 seconds (1135 mGy). COMPLICATIONS: None immediate. TECHNIQUE: Following a full explanation of the procedure along with the potential associated complications, an informed witnessed consent was obtained. The risks of intracranial hemorrhage of 10%, worsening neurological deficit, ventilator dependency, death and inability to revascularize were all reviewed in detail with the patient's spouse. The patient was then put under general anesthesia by the Department of Anesthesiology at [facility]. The right groin was prepped and draped in the usual sterile fashion. Thereafter using modified Seldinger technique,  transfemoral access into the right common femoral artery was obtained without difficulty. Over a 0.035 inch guidewire an 8 French 25 cm Pinnacle sheath was inserted. Through this, and also over a 0.035 inch guidewire a combination of Simmons 2 6 French catheter inside of an 8 [place] 087 95 cm balloon guide catheter was advanced and positioned in the right common carotid artery. The guidewire and support catheter were removed. Good aspiration obtained from the hub of the balloon guide catheter. Gentle control arteriogram performed through the balloon guide catheter demonstrated no evidence of spasms, dissections or of intraluminal filling defects. Right common carotid arteriogram was then performed. FINDINGS: The right common carotid arteriogram demonstrates the right external carotid artery and its major branches to be widely patent. The right internal carotid artery at the bulb to the cranial skull base demonstrates wide patency. The petrous, the cavernous and the supraclinoid segments demonstrate wide patency as well. The right middle cerebral artery and the right anterior cerebral artery opacify into the capillary and venous phases. Angiographic occlusion is noted of the distal M2 segment of the inferior division of the right middle cerebral artery. Transient cross-filling via the anterior communicating artery of the left anterior cerebral A2 segment and distally is evident. PROCEDURE: Through the balloon guide catheter now positioned in the distal cervical right ICA, a combination of an 055 132 cm Zoom aspiration catheter inside of which was an 021 160 cm microcatheter was advanced over a 0.014 inch soft tip Aristotle micro guidewire to the supraclinoid right ICA. Using a torque device, the micro guidewire was advanced through the occluded inferior division into the M2 M3 region followed by the microcatheter. The guidewire was removed. Free aspiration was obtained. A gentle control arteriogram performed through  the microcatheter demonstrated safe positioning of the tip of the microcatheter which was then connected to continuous heparinized saline infusion. A 4 mm x 40 mm Solitaire X retrieval device was then advanced to the distal end of the microcatheter and deployed by retrieving the microcatheter while gently holding the micro guidewire. At this time the 055 Zoom aspiration catheter was advanced into the inferior division of the left middle cerebral artery just proximal to the occluded segment. With proximal flow arrest, and constant aspiration applied with a 20 mL syringe at the hub of the balloon guide catheter, and at the hub of the aspiration catheter with the aspiration device for approximately 2-1/2 minutes, the combination of the retrieval device, the microcatheter, and the Zoom aspiration catheter were retrieved and removed. Pieces of clot were noted in the canister, and also in the interstices of the retrieval device. Control arteriogram performed following reversal of flow arrest in the right internal carotid artery demonstrated complete revascularization of the treated inferior division with a TICI 3 revascularization. A 3 minute delayed arteriogram performed through the right  internal carotid artery demonstrated continued patency of the right middle cerebral artery distribution. The native non DSA images demonstrated stagnation of contrast in the posterior perisylvian subarachnoid region overlying the angular gyrus. A flat panel CT of the brain demonstrated a mixture of contrast and blood in the right perisylvian fissure, extending posteriorly and slightly superiorly into the temporoparietal regions. The patient's hemodynamic status remained stable. A repeat angiogram performed 10 minutes later demonstrated no worsening of the contrast stasis. A repeat CT of the brain performed at the end of the procedure continued to demonstrate no change in the mixture of contrast and presumably subarachnoid hemorrhage.  The balloon guide catheter was retrieved and removed. An 8 [place] Angio-Seal was applied for hemostasis at the right groin puncture site. Distal pulses remained Dopplerable with no change in the non Dopplerable right dorsalis pedis. Patient was left intubated due to patient's neurologic condition. He was then transferred to the neuro ICU for post revascularization care. IMPRESSION: Status post endovascular complete revascularization of occluded prominent inferior division of the right middle cerebral artery in the distal M2 M3 region with 1 pass with a 4 mm x 40 mm Solitaire X retrieval device and contact aspiration achieving a TICI 3 revascularization. PLAN: As per referring MD. Electronically Signed   By: Luanne Bras M.D.   On: 09/14/2021 14:45   DG Chest Port 1 View  Result Date: 09/12/2021 CLINICAL DATA:  Status post intubation. EXAM: PORTABLE CHEST 1 VIEW COMPARISON:  03/29/2021 FINDINGS: ET tube tip is in satisfactory position approximately 4 cm above the carina. Enteric tube tip and side port are in the left upper quadrant of the abdomen below the GE junction. Stable cardiomediastinal contours. Biapical pleuroparenchymal scarring is again identified and appears similar to previous exam. Chronic diffuse interstitial reticulation within upper lung zone predominance is again noted. No superimposed airspace consolidation, pleural effusion or edema. IMPRESSION: 1. Satisfactory position of the ET tube with tip above the carina. 2. Enteric tube tip and side port are below the level of the hemidiaphragms. 3. Chronic interstitial changes within both lungs, similar to previous exam. Electronically Signed   By: Kerby Moors M.D.   On: 09/12/2021 18:41   DG Abd Portable 1V  Result Date: 09/13/2021 CLINICAL DATA:  Enteric tube placement EXAM: PORTABLE ABDOMEN - 1 VIEW COMPARISON:  None Available. FINDINGS: Tip of enteric tube is seen in the stomach. Watchman device is noted in the region of left atrial  appendage. Lower abdomen and pelvis are not included in the image. IMPRESSION: Tip of enteric tube is seen in the stomach. Electronically Signed   By: Elmer Picker M.D.   On: 09/13/2021 14:42   ECHOCARDIOGRAM COMPLETE  Result Date: 09/13/2021    ECHOCARDIOGRAM REPORT   Patient Name:   PAULO KEIMIG Date of Exam: 09/13/2021 Medical Rec #:  [id number]    Height:       75.0 in Accession #:    [id number]   Weight:       172.0 lb Date of Birth:  03/12/1931     BSA:          2.058 m Patient Age:    64 years     BP:           119/79 mmHg Patient Gender: M            HR:           69 bpm. Exam Location:  Inpatient Procedure: 2D Echo, Cardiac Doppler, Color Doppler and Intracardiac  Opacification Agent Indications:    Stroke  History:        Patient has no prior history of Echocardiogram examinations.  Sonographer:    Jyl Heinz Referring Phys: [id number] DEVON SHAFER  Sonographer Comments: Echo performed with patient supine and on artificial respirator. IMPRESSIONS  1. Left ventricular ejection fraction, by estimation, is 30 to 35%. The left ventricle has moderately decreased function. The left ventricle demonstrates global hypokinesis. Left ventricular diastolic parameters are consistent with Grade III diastolic dysfunction (restrictive).  2. Right ventricular systolic function is normal. The right ventricular size is normal.  3. Left atrial size was severely dilated.  4. Right atrial size was severely dilated.  5. The mitral valve is degenerative. Mild to at most mild-moderate mitral valve regurgitation. Atrial functional in nature, blunting of pulmonary vein signal; evidence of increase left atrial pressure.  6. Tricuspid valve regurgitation is mild to moderate and eccentric.  7. The aortic valve is tricuspid. Aortic valve regurgitation is not visualized. No aortic stenosis is present.  8. The inferior vena cava is normal in size with <50% respiratory variability, suggesting right atrial pressure of 8  mmHg, thought patient is on ventilator. Comparison(s): No prior Echocardiogram. FINDINGS  Left Ventricle: Left ventricular ejection fraction, by estimation, is 30 to 35%. The left ventricle has moderately decreased function. The left ventricle demonstrates global hypokinesis. Definity contrast agent was given IV to delineate the left ventricular endocardial borders. The left ventricular internal cavity size was normal in size. There is no left ventricular hypertrophy. Left ventricular diastolic parameters are consistent with Grade III diastolic dysfunction (restrictive). Right Ventricle: The right ventricular size is normal. No increase in right ventricular wall thickness. Right ventricular systolic function is normal. Left Atrium: Left atrial size was severely dilated. Right Atrium: Right atrial size was severely dilated. Pericardium: There is no evidence of pericardial effusion. Mitral Valve: The mitral valve is degenerative in appearance. Mild to moderate mitral valve regurgitation. Tricuspid Valve: Eccentric. The tricuspid valve is normal in structure. Tricuspid valve regurgitation is mild to moderate. No evidence of tricuspid stenosis. Aortic Valve: The aortic valve is tricuspid. Aortic valve regurgitation is not visualized. No aortic stenosis is present. Aortic valve peak gradient measures 6.9 mmHg. Pulmonic Valve: The pulmonic valve was normal in structure. Pulmonic valve regurgitation is not visualized. No evidence of pulmonic stenosis. Aorta: The aortic root and ascending aorta are structurally normal, with no evidence of dilitation. Venous: The inferior vena cava is normal in size with less than 50% respiratory variability, suggesting right atrial pressure of 8 mmHg. IAS/Shunts: No atrial level shunt detected by color flow Doppler.  LEFT VENTRICLE PLAX 2D LVIDd:         5.70 cm      Diastology LVIDs:         4.60 cm      LV e' medial:    6.57 cm/s LV PW:         1.10 cm      LV E/e' medial:  8.2 LV IVS:         0.90 cm      LV e' lateral:   10.30 cm/s LVOT diam:     2.20 cm      LV E/e' lateral: 5.2 LV SV:         73 LV SV Index:   35 LVOT Area:     3.80 cm  LV Volumes (MOD) LV vol d, MOD A2C: 139.0 ml LV vol d, MOD A4C: 144.0 ml  LV vol s, MOD A2C: 86.4 ml LV vol s, MOD A4C: 96.1 ml LV SV MOD A2C:     52.6 ml LV SV MOD A4C:     144.0 ml LV SV MOD BP:      49.7 ml RIGHT VENTRICLE            IVC RV Basal diam:  3.60 cm    IVC diam: 1.80 cm RV S prime:     6.81 cm/s TAPSE (M-mode): 1.2 cm LEFT ATRIUM              Index        RIGHT ATRIUM           Index LA diam:        4.70 cm  2.28 cm/m   RA Area:     28.60 cm LA Vol (A2C):   93.8 ml  45.58 ml/m  RA Volume:   106.00 ml 51.51 ml/m LA Vol (A4C):   90.3 ml  43.88 ml/m LA Biplane Vol: 102.0 ml 49.56 ml/m  AORTIC VALVE AV Area (Vmax): 2.65 cm AV Vmax:        131.00 cm/s AV Peak Grad:   6.9 mmHg LVOT Vmax:      91.40 cm/s LVOT Vmean:     64.500 cm/s LVOT VTI:       0.192 m  AORTA Ao Root diam: 3.10 cm Ao Asc diam:  3.10 cm MITRAL VALVE               TRICUSPID VALVE MV Area (PHT): 4.46 cm    TR Peak grad:   21.2 mmHg MV Decel Time: 170 msec    TR Vmax:        230.00 cm/s MV E velocity: 53.90 cm/s MV A velocity: 25.40 cm/s  SHUNTS MV E/A ratio:  2.12        Systemic VTI:  0.19 m                            Systemic Diam: 2.20 cm Rudean Haskell MD Electronically signed by Rudean Haskell MD Signature Date/Time: 09/13/2021/11:38:03 AM    Final    ECHO TEE  Result Date: 09/13/2021    TRANSESOPHOGEAL ECHO REPORT   Patient Name:   MARTAVIUS LUSTY Date of Exam: 09/13/2021 Medical Rec #:  [id number]    Height:       75.0 in Accession #:    [id number]   Weight:       172.0 lb Date of Birth:  [birth year]     BSA:          2.058 m Patient Age:    42 years     BP:           121/62 mmHg Patient Gender: M            HR:           72 bpm. Exam Location:  Inpatient Procedure: Transesophageal Echo, Color Doppler and 3D Echo Indications:     Watchman device evaluation                   Stroke  History:         Patient has prior history of Echocardiogram examinations, most                  recent 09/13/2021. Arrythmias:Atrial Fibrillation. S/P Watchman  device.  Sonographer:     Clayton Lefort RDCS (AE) Referring Phys:  [id number] [place] Diagnosing Phys: Sanda Klein MD  Sonographer Comments: Echo permformed at bedside. PROCEDURE: After discussion of the risks and benefits of a TEE, an informed consent was obtained from a family member. The transesophogeal probe was passed without difficulty through the esophogus of the patient. Image quality was good. The patient developed no complications during the procedure. No additional sedation was used (patient endiotracheally intubated). IMPRESSIONS  1. Left ventricular ejection fraction, by estimation, is 25 to 30%. The left ventricle has severely decreased function. The left ventricle demonstrates global hypokinesis. The left ventricular internal cavity size was mildly dilated. Left ventricular diastolic function could not be evaluated.  2. Right ventricular systolic function is normal. The right ventricular size is normal.  3. There is a well seated Watchman left atrial appendage occluder. There is a leak at the anterolateral aspect of the device with a width of approximately 5 mm and a circumferential extent of approximately 45 degrees. There is no device associated thrombus. Left atrial size was severely dilated. No left atrial/left atrial appendage thrombus was detected.  4. Right atrial size was severely dilated.  5. The mitral valve is normal in structure. Mild mitral valve regurgitation.  6. Tricuspid valve regurgitation is mild to moderate.  7. The aortic valve is tricuspid. Aortic valve regurgitation is not visualized. No aortic stenosis is present.  8. There is mild (Grade II) plaque involving the descending aorta. FINDINGS  Left Ventricle: Left ventricular ejection fraction, by estimation, is 25 to 30%. The left  ventricle has severely decreased function. The left ventricle demonstrates global hypokinesis. The left ventricular internal cavity size was mildly dilated. There is no left ventricular hypertrophy. Left ventricular diastolic function could not be evaluated due to atrial fibrillation. Left ventricular diastolic function could not be evaluated. Right Ventricle: The right ventricular size is normal. No increase in right ventricular wall thickness. Right ventricular systolic function is normal. Left Atrium: There is a well seated Watchman left atrial appendage occluder. There is a leak at the anterolateral aspect of the device with a width of approximately 5 mm and a circumferential extent of approximately 45 degrees. There is no device associated thrombus. Left atrial size was severely dilated. No left atrial/left atrial appendage thrombus was detected. Right Atrium: Right atrial size was severely dilated. Pericardium: There is no evidence of pericardial effusion. Mitral Valve: The mitral valve is normal in structure. Mild mitral valve regurgitation, with centrally-directed jet. Tricuspid Valve: The tricuspid valve is normal in structure. Tricuspid valve regurgitation is mild to moderate. Aortic Valve: The aortic valve is tricuspid. Aortic valve regurgitation is not visualized. No aortic stenosis is present. Pulmonic Valve: The pulmonic valve was normal in structure. Pulmonic valve regurgitation is not visualized. Aorta: The aortic root, ascending aorta and aortic arch are all structurally normal, with no evidence of dilitation or obstruction. There is mild (Grade II) plaque involving the descending aorta. IAS/Shunts: No atrial level shunt detected by color flow Doppler. Sanda Klein MD Electronically signed by Sanda Klein MD Signature Date/Time: 09/13/2021/2:42:17 PM    Final    IR PERCUTANEOUS ART THROMBECTOMY/INFUSION INTRACRANIAL INC DIAG ANGIO  Result Date: 09/14/2021 INDICATION: New onset severe  dysarthria, right-sided weakness and right-sided neglect. Occluded mid to distal M2 segment of the inferior division of the right middle cerebral artery. EXAM: 1. EMERGENT LARGE VESSEL OCCLUSION THROMBOLYSIS (anterior CIRCULATION) COMPARISON:  CT angiogram of the head and neck of 09/12/2021.  MEDICATIONS: No antibiotic was administered within 1 hour of the procedure. ANESTHESIA/SEDATION: General anesthesia. CONTRAST:  Omnipaque 300 approximately 60 mL. FLUOROSCOPY TIME:  Fluoroscopy Time: 17 minutes 42 seconds (1135 mGy). COMPLICATIONS: None immediate. TECHNIQUE: Following a full explanation of the procedure along with the potential associated complications, an informed witnessed consent was obtained. The risks of intracranial hemorrhage of 10%, worsening neurological deficit, ventilator dependency, death and inability to revascularize were all reviewed in detail with the patient's spouse. The patient was then put under general anesthesia by the Department of Anesthesiology at [facility]. The right groin was prepped and draped in the usual sterile fashion. Thereafter using modified Seldinger technique, transfemoral access into the right common femoral artery was obtained without difficulty. Over a 0.035 inch guidewire an 8 French 25 cm Pinnacle sheath was inserted. Through this, and also over a 0.035 inch guidewire a combination of Simmons 2 6 French catheter inside of an 8 [place] 087 95 cm balloon guide catheter was advanced and positioned in the right common carotid artery. The guidewire and support catheter were removed. Good aspiration obtained from the hub of the balloon guide catheter. Gentle control arteriogram performed through the balloon guide catheter demonstrated no evidence of spasms, dissections or of intraluminal filling defects. Right common carotid arteriogram was then performed. FINDINGS: The right common carotid arteriogram demonstrates the right external carotid artery and its major  branches to be widely patent. The right internal carotid artery at the bulb to the cranial skull base demonstrates wide patency. The petrous, the cavernous and the supraclinoid segments demonstrate wide patency as well. The right middle cerebral artery and the right anterior cerebral artery opacify into the capillary and venous phases. Angiographic occlusion is noted of the distal M2 segment of the inferior division of the right middle cerebral artery. Transient cross-filling via the anterior communicating artery of the left anterior cerebral A2 segment and distally is evident. PROCEDURE: Through the balloon guide catheter now positioned in the distal cervical right ICA, a combination of an 055 132 cm Zoom aspiration catheter inside of which was an 021 160 cm microcatheter was advanced over a 0.014 inch soft tip Aristotle micro guidewire to the supraclinoid right ICA. Using a torque device, the micro guidewire was advanced through the occluded inferior division into the M2 M3 region followed by the microcatheter. The guidewire was removed. Free aspiration was obtained. A gentle control arteriogram performed through the microcatheter demonstrated safe positioning of the tip of the microcatheter which was then connected to continuous heparinized saline infusion. A 4 mm x 40 mm Solitaire X retrieval device was then advanced to the distal end of the microcatheter and deployed by retrieving the microcatheter while gently holding the micro guidewire. At this time the 055 Zoom aspiration catheter was advanced into the inferior division of the left middle cerebral artery just proximal to the occluded segment. With proximal flow arrest, and constant aspiration applied with a 20 mL syringe at the hub of the balloon guide catheter, and at the hub of the aspiration catheter with the aspiration device for approximately 2-1/2 minutes, the combination of the retrieval device, the microcatheter, and the Zoom aspiration catheter  were retrieved and removed. Pieces of clot were noted in the canister, and also in the interstices of the retrieval device. Control arteriogram performed following reversal of flow arrest in the right internal carotid artery demonstrated complete revascularization of the treated inferior division with a TICI 3 revascularization. A 3 minute delayed arteriogram performed through the  right internal carotid artery demonstrated continued patency of the right middle cerebral artery distribution. The native non DSA images demonstrated stagnation of contrast in the posterior perisylvian subarachnoid region overlying the angular gyrus. A flat panel CT of the brain demonstrated a mixture of contrast and blood in the right perisylvian fissure, extending posteriorly and slightly superiorly into the temporoparietal regions. The patient's hemodynamic status remained stable. A repeat angiogram performed 10 minutes later demonstrated no worsening of the contrast stasis. A repeat CT of the brain performed at the end of the procedure continued to demonstrate no change in the mixture of contrast and presumably subarachnoid hemorrhage. The balloon guide catheter was retrieved and removed. An 8 [place] Angio-Seal was applied for hemostasis at the right groin puncture site. Distal pulses remained Dopplerable with no change in the non Dopplerable right dorsalis pedis. Patient was left intubated due to patient's neurologic condition. He was then transferred to the neuro ICU for post revascularization care. IMPRESSION: Status post endovascular complete revascularization of occluded prominent inferior division of the right middle cerebral artery in the distal M2 M3 region with 1 pass with a 4 mm x 40 mm Solitaire X retrieval device and contact aspiration achieving a TICI 3 revascularization. PLAN: As per referring MD. Electronically Signed   By: Luanne Bras M.D.   On: 09/14/2021 14:45   CT HEAD CODE STROKE WO CONTRAST  Result Date:  09/12/2021 CLINICAL DATA:  Code stroke.  Neuro deficit, acute, stroke suspected EXAM: CT HEAD WITHOUT CONTRAST TECHNIQUE: Contiguous axial images were obtained from the base of the skull through the vertex without intravenous contrast. RADIATION DOSE REDUCTION: This exam was performed according to the departmental dose-optimization program which includes automated exposure control, adjustment of the mA and/or kV according to patient size and/or use of iterative reconstruction technique. COMPARISON:  CT head July 13, 2020 (without report). FINDINGS: Brain: No evidence of acute large vascular territory infarction, convincing acute hemorrhage, hydrocephalus, extra-axial collection or mass lesion/mass effect. Patchy white matter hypodensities, nonspecific but compatible with chronic microvascular ischemic disease. Cortical hyperdensity associated with the right occipital lobe appears unchanged relative to July 13, 2020 and therefore is favored to represent cortical calcification from prior insult. Vascular: No hyperdense vessel identified. Calcific intracranial atherosclerosis. Skull: No acute fracture. Sinuses/Orbits: Partially imaged right inferior maxillary sinus mucosal thickening and mild ethmoid air cell mucosal thickening. No acute overall findings. Other: No mastoid effusions. ASPECTS [facility] Stroke Program Early CT Score) Total score (0-10 with 10 being normal): 10. IMPRESSION: 1. No evidence of acute intracranial abnormality.   ASPECTS is 10. 2. Cortical hyperdensity associated with the right occipital lobe appears unchanged relative to July 13, 2020 and therefore is favored to represent cortical calcification from prior insult (over subarachnoid hemorrhage). Findings discussed with Dr. Theda Sers via telephone at 3:13 p.m. Electronically Signed   By: Margaretha Sheffield M.D.   On: 09/12/2021 15:16   CT ANGIO HEAD NECK W WO CM (CODE STROKE)  Result Date: 09/12/2021 CLINICAL DATA:  Neuro deficit, acute,  stroke suspected EXAM: CT ANGIOGRAPHY HEAD AND NECK TECHNIQUE: Multidetector CT imaging of the head and neck was performed using the standard protocol during bolus administration of intravenous contrast. Multiplanar CT image reconstructions and MIPs were obtained to evaluate the vascular anatomy. Carotid stenosis measurements (when applicable) are obtained utilizing NASCET criteria, using the distal internal carotid diameter as the denominator. RADIATION DOSE REDUCTION: This exam was performed according to the departmental dose-optimization program which includes automated exposure control, adjustment of the mA  and/or kV according to patient size and/or use of iterative reconstruction technique. CONTRAST:  72m OMNIPAQUE IOHEXOL 350 MG/ML SOLN COMPARISON:  None Available. FINDINGS: CTA NECK FINDINGS Aortic arch: Great vessel origins are patent without significant stenosis. Right carotid system: No evidence of dissection, stenosis (50% or greater) or occlusion. Left carotid system: No evidence of dissection, stenosis (50% or greater) or occlusion. Vertebral arteries: Right dominant. No evidence of dissection, stenosis (50% or greater) or occlusion. Skeleton: Moderate multilevel degenerative change. Other neck: No acute abnormality. Upper chest: Biapical pleuroparenchymal scarring and probable fibrosis, better characterized on prior CT chest. Review of the MIP images confirms the above findings CTA HEAD FINDINGS Anterior circulation: Mild calcific atherosclerosis of bilateral intracranial ICAs with associated mild narrowing. Right M1 MCA is patent. Occlusion of a mid right M2 MCA vessel. Bilateral ACAs are patent. Left MCA is patent. Mild to moderate proximal left M2 MCA stenosis. Posterior circulation: Bilateral intradural vertebral arteries, basilar artery, and both posterior cerebral arteries are patent. Moderate proximal left P2 PCA stenosis. Venous sinuses: As permitted by contrast timing, patent. Review of the  MIP images confirms the above findings IMPRESSION: 1. Mid right M2 MCA occlusion within the sylvian fissure. 2. Moderate proximal left P2 PCA stenosis. 3. Mild-to-moderate left M2 MCA stenosis. Findings discussed with Dr. CTheda Sersvia telephone at 3:25 p.m. Electronically Signed   By: FMargaretha SheffieldM.D.   On: 09/12/2021 15:27      HISTORY OF PRESENT ILLNESS Mr. RAlberta Cairnsis a 88y.o. male with history of SAH, atrial fibrillation s/p Watchman procedure, CHF, pancytopenia, HTN, hypothyroidism, BPH and OSA on CPAP presenting with left facial droop, dysarthria and left arm weakness. He was found to have a right MCA M2 occlusion and was taken for mechanical thrombectomy.  This was successful.  TEE showed leaking around watchman device. ASA '81mg'$  ordered for stroke prevention. CT post procedure showed a SAH. [facility] Plan to follow up with neurology post scan to [facility] Stroke: Right MCA infarct s/p thrombectomy likely secondary due to embolism in setting of atrial fibrillation not on anticoagulation but s/p Watchman procedure Code Stroke CT head No acute abnormality. ASPECTS 10.    CTA head & neck mid right M2 MCA occlusion, moderate proximal left P2 PCA stenosis, mild to moderate left M2 MCA stenosis MRI  acute infarcts in right insula and right temporal lobe, SAH along right temporal and frontoparietal regions 2D Echo EF 325-36% grade 3 diastolic dysfunction, severely dilated left atrium, mild mitral regurgitation, mild to moderate tricuspid regurgitation, no atrial level shunt TEE  Severely depressed left ventricular systolic function with global hypokinesis. Severe biatrial dilation. Relatively mild aortic atherosclerosis. There is a well seated Watchman occluder in the left atrial appendage. There is a 5 mm wide gap over a roughly 45 degree arc at the anterolateral aspect of the device. LDL 103 HgbA1c 5.7 VTE prophylaxis - SCDs       Diet     Diet clear liquid Room service appropriate? Yes; Fluid consistency: Thin        aspirin 81 mg daily prior to admission, now on No antithrombotic. [facility]  [facility] Disposition:  pending   Atrial fibrillation Patient is in permanent a-fib [facility] S/p Watchman procedure TEE shows leak around watchman device Will need oral anticoagulation   Hypertension Home meds:  none Stable Keep SBP 120-140 for 24 hours Long-term  BP goal normotensive   Hyperlipidemia Home meds:  none LDL 103, goal < 70 Add rosuvastatin 20 mg daily  Continue statin at discharge   Other Stroke Risk Factors Advanced Age >/= 49  Congestive heart failure   Other Active Problems Hypothyroidism Continue home synthroid   DISCHARGE EXAM Blood pressure 122/77, pulse 78, temperature 98.3 F (36.8 C), temperature source Oral, resp. rate 19, height '6\' 3"'$  (1.905 m), weight 78 kg, SpO2 98 %.  General - Well nourished, well developed pleasant elderly Caucasian male, in no apparent distress. Cardiovascular - Irregular rhythm, rate controlled in the 80s Respiratory- on room air, no distress   Mental Status -  Aox4, speech is clear. Language including naming and repetition intact, conversating appropriately Attention span and concentration were normal. Recent and remote memory were intact. Fund of Knowledge was assessed and was intact.   Cranial Nerves II - XII - II - Visual field intact OU. III, IV, VI - Extraocular movements intact. V - Facial sensation intact bilaterally. VII - Facial movement intact bilaterally. VIII - Hearing & vestibular intact bilaterally. X - Palate elevates symmetrically. XI - Chin turning & shoulder shrug intact bilaterally. XII - Tongue protrusion intact.   Motor Strength and Tone- The patient's strength was normal in all extremities and pronator drift was absent.  Bulk and tone normal and  fasciculations were absent.   Sensory - Light touch, temperature/pinprick were assessed and were symmetrical.   Coordination - The patient had normal movements in the hands and feet with no ataxia or dysmetria.  Gait and Station - deferred.  Discharge Diet       Diet   Diet clear liquid Room service appropriate? Yes; Fluid consistency: Thin   liquids  DISCHARGE PLAN Disposition:  Home with home health aspirin 81 mg daily for secondary stroke prevention  Ongoing stroke risk factor control by Primary Care Physician at time of discharge Follow-up PCP  in 2 weeks. Follow-up in [facility] in 8 weeks, office to schedule an appointment.  Follow up with cardiology, referral sent CT scan ordered for 5/31, follow up with Dr. Leonie Man for Eliquis order  35 minutes were spent preparing discharge.  Patient seen and examined by NP/APP with MD. MD to update note as needed.   Janine Ores, DNP, FNP-BC [facility] Pager: [phone]  I have personally obtained history,examined this patient, reviewed notes, independently viewed imaging studies, participated in medical decision making and plan of care.ROS completed by me personally and pertinent positives fully documented  I have made any additions or clarifications directly to the above note. Agree with note above.    Antony Contras, MD Medical Director [facility] Pager: [phone] 09/15/2021 10:50 AM

## 2021-09-15 NOTE — Progress Notes (Signed)
VASCULAR LAB    ABI has been performed.  See CV proc for preliminary results.   Geralyn Figiel, RVT 09/15/2021, 11:53 AM

## 2021-09-15 NOTE — Progress Notes (Signed)
Patient requires a 3 in 1 due to his decreased ability to ambulate to the restroom safely at home.

## 2021-09-15 NOTE — Care Management Important Message (Signed)
Important Message  Patient Details  Name: Elijah Stone MRN: [id number] Date of Birth: March 08, 1931   Medicare Important Message Given:  Yes     Memory Argue 09/15/2021, 3:42 PM

## 2021-09-15 NOTE — Progress Notes (Signed)
Progress Note   Pt has made good progress toward goals.  Emphasis on general safety with transitions and transfers, progression of gait stability and negotiation of stairs.    09/15/21 1708  PT Visit Information  Last PT Received On 09/15/21  Assistance Needed +1  History of Present Illness Pt adm 5/22 with lt sided weakness. CT showed rt M2 occlusion. Pt underwent mechanical thrombectomy by IR 5/22. Post procedure contrast extravasation vs SAH. Pt left intubated after procedure. Pt extubated 5/23. TEE on 5/23 showed leaking watchman device. MRI + Acute infarcts in the right insula and right temporal lobe, with  smaller areas of infarction in the right frontal and parietal  cortex. PMH - afib with watchman device, SAH, CHF, hypothyroidism,  Subjective Data  Patient Stated Goal return home  Precautions  Precautions Fall  Pain Assessment  Pain Assessment No/denies pain  Cognition  Arousal/Alertness Awake/alert  Behavior During Therapy WFL for tasks assessed/performed  Overall Cognitive Status History of cognitive impairments - at baseline  Bed Mobility  Overal bed mobility Needs Assistance  Bed Mobility Supine to Sit  Supine to sit Min guard  Transfers  Overall transfer level Needs assistance  Transfers Sit to/from Stand  Sit to Stand Min guard  General transfer comment cues for hand placement ascent and control for descent  Ambulation/Gait  Ambulation/Gait assistance Min assist  Gait Distance (Feet) 200 Feet  Assistive device None  Gait Pattern/deviations Step-through pattern;Decreased stride length  General Gait Details mildly unsteady in general with drifts and gentle stagger to regain balance.  Mostly min assist especially with moderat challenge, but periods of min guard to let pt work on stability. Some worsening of stability with fatigue.  pt was most unsteady backing up and turning abruptly.  Gait velocity interpretation <1.8 ft/sec, indicate of risk for recurrent falls   Stairs Yes  Stairs assistance Min guard  Stair Management One rail Right;Step to pattern;Forwards  Number of Stairs 2  General stair comments safe with rail and guarding  Modified Rankin (Stroke Patients Only)  Pre-Morbid Rankin Score 1  Modified Rankin 4  Balance  Overall balance assessment Needs assistance  Sitting balance-Leahy Scale Good  Standing balance support No upper extremity supported;During functional activity  Standing balance-Leahy Scale Fair  General Comments  General comments (skin integrity, edema, etc.) reinforced education on post d/c handling and progression of activity.  Stressed some safety parameters with the patient  PT - End of Session  Activity Tolerance Patient tolerated treatment well  Patient left in bed;with call bell/phone within reach;with family/visitor present  Nurse Communication Mobility status   PT - Assessment/Plan  PT Plan Current plan remains appropriate  PT Visit Diagnosis Other abnormalities of gait and mobility (R26.89)  PT Frequency (ACUTE ONLY) Min 4X/week  Follow Up Recommendations Home health PT  Assistance recommended at discharge Intermittent Supervision/Assistance  Patient can return home with the following A little help with walking and/or transfers;Help with stairs or ramp for entrance  PT equipment None recommended by PT  AM-PAC PT "6 Clicks" Mobility Outcome Measure (Version 2)  Help needed turning from your back to your side while in a flat bed without using bedrails? 4  Help needed moving from lying on your back to sitting on the side of a flat bed without using bedrails? 3  Help needed moving to and from a bed to a chair (including a wheelchair)? 3  Help needed standing up from a chair using your arms (e.g., wheelchair or bedside chair)? 3  Help needed to walk in hospital room? 3  Help needed climbing 3-5 steps with a railing?  3  6 Click Score 19  Consider Recommendation of Discharge To: Home with [facility]  Progressive Mobility   What is the highest level of mobility based on the progressive mobility assessment? Level 5 (Walks with assist in room/hall) - Balance while stepping forward/back and can walk in room with assist - Complete  Activity Ambulated with assistance in hallway  PT Goal Progression  Progress towards PT goals Progressing toward goals  Acute Rehab PT Goals  PT Goal Formulation With patient/family  Time For Goal Achievement 09/21/21  Potential to Achieve Goals Good  PT Time Calculation  PT Start Time (ACUTE ONLY) 1313  PT Stop Time (ACUTE ONLY) 1332  PT Time Calculation (min) (ACUTE ONLY) 19 min   09/15/2021  Ginger Carne., PT Acute Rehabilitation Services [phone]  (pager) [phone]  (office)

## 2021-09-15 NOTE — Plan of Care (Signed)
  Problem: Education: Goal: Knowledge of General Education information will improve Description: Including pain rating scale, medication(s)/side effects and non-pharmacologic comfort measures Outcome: Progressing   Problem: Health Behavior/Discharge Planning: Goal: Ability to manage health-related needs will improve Outcome: Progressing   Problem: Clinical Measurements: Goal: Ability to maintain clinical measurements within normal limits will improve Outcome: Progressing Goal: Will remain free from infection Outcome: Progressing Goal: Diagnostic test results will improve Outcome: Progressing Goal: Respiratory complications will improve Outcome: Progressing Goal: Cardiovascular complication will be avoided Outcome: Progressing   Problem: Activity: Goal: Risk for activity intolerance will decrease Outcome: Progressing   Problem: Nutrition: Goal: Adequate nutrition will be maintained Outcome: Progressing   Problem: Coping: Goal: Level of anxiety will decrease Outcome: Progressing   Problem: Elimination: Goal: Will not experience complications related to bowel motility Outcome: Progressing Goal: Will not experience complications related to urinary retention Outcome: Progressing   Problem: Pain Managment: Goal: General experience of comfort will improve Outcome: Progressing   Problem: Safety: Goal: Ability to remain free from injury will improve Outcome: Progressing   Problem: Skin Integrity: Goal: Risk for impaired skin integrity will decrease Outcome: Progressing   Problem: Education: Goal: Knowledge of disease or condition will improve Outcome: Progressing Goal: Knowledge of secondary prevention will improve (SELECT ALL) Outcome: Progressing Goal: Knowledge of patient specific risk factors will improve (INDIVIDUALIZE FOR PATIENT) Outcome: Progressing Goal: Individualized Educational Video(s) Outcome: Progressing   Problem: Coping: Goal: Will verbalize  positive feelings about self Outcome: Progressing Goal: Will identify appropriate support needs Outcome: Progressing   Problem: Health Behavior/Discharge Planning: Goal: Ability to manage health-related needs will improve Outcome: Progressing   Problem: Self-Care: Goal: Ability to participate in self-care as condition permits will improve Outcome: Progressing Goal: Verbalization of feelings and concerns over difficulty with self-care will improve Outcome: Progressing Goal: Ability to communicate needs accurately will improve Outcome: Progressing   Problem: Nutrition: Goal: Risk of aspiration will decrease Outcome: Progressing Goal: Dietary intake will improve Outcome: Progressing   Problem: Ischemic Stroke/TIA Tissue Perfusion: Goal: Complications of ischemic stroke/TIA will be minimized Outcome: Progressing   Problem: Spontaneous Subarachnoid Hemorrhage Tissue Perfusion: Goal: Complications of Spontaneous Subarachnoid Hemorrhage will be minimized Outcome: Progressing   Problem: Safety: Goal: Non-violent Restraint(s) Outcome: Progressing

## 2021-09-15 NOTE — Progress Notes (Signed)
PROGRESS NOTE AM  In late morning, met with pt's family while pt at a procedure at the behest of family.  I took the time to give them a general picture of the progress expected with mobility, some general guideline to help keep pt safe, give them peace of mind that he/they could be safe until HHPT started.  Talked about general handling techniques, importance of keeping him mobile, work from the house to [employer].  Family verbalized understanding and increased comfort/confidence with d/c.  They did ask to return to work briefly with pt since he had only had 1 session with PT supposedly.  09/15/2021  Ginger Carne., PT Acute Rehabilitation Services [phone]  (pager) [phone]  (office)

## 2021-09-27 ENCOUNTER — Encounter (HOSPITAL_COMMUNITY): Payer: Self-pay

## 2021-09-27 ENCOUNTER — Ambulatory Visit (HOSPITAL_COMMUNITY)
Admission: RE | Admit: 2021-09-27 | Discharge: 2021-09-27 | Disposition: A | Discharge status: Home / Self Care | Payer: Medicare Other | Source: Ambulatory Visit | Attending: Neurology | Admitting: Neurology

## 2021-09-27 DIAGNOSIS — I6601 Occlusion and stenosis of right middle cerebral artery: Secondary | ICD-10-CM | POA: Diagnosis not present

## 2021-10-04 ENCOUNTER — Encounter: Payer: Self-pay | Admitting: Neurology

## 2021-10-05 ENCOUNTER — Other Ambulatory Visit: Payer: Self-pay | Admitting: Neurology

## 2021-10-05 MED ORDER — APIXABAN 2.5 MG PO TABS: 2.5000 mg | ORAL_TABLET | Freq: Two times a day (BID) | ORAL | 3 refills | Status: DC

## 2021-10-17 ENCOUNTER — Ambulatory Visit: Payer: Medicare Other | Admitting: Physician Assistant

## 2021-10-31 ENCOUNTER — Ambulatory Visit (INDEPENDENT_AMBULATORY_CARE_PROVIDER_SITE_OTHER): Payer: Medicare Other | Admitting: Cardiology

## 2021-10-31 ENCOUNTER — Encounter: Payer: Self-pay | Admitting: Cardiology

## 2021-10-31 VITALS — BP 96/52 | HR 86 | Ht 75.0 in | Wt 166.6 lb

## 2021-10-31 DIAGNOSIS — I4821 Permanent atrial fibrillation: Secondary | ICD-10-CM | POA: Diagnosis not present

## 2021-10-31 DIAGNOSIS — I428 Other cardiomyopathies: Secondary | ICD-10-CM

## 2021-10-31 DIAGNOSIS — R931 Abnormal findings on diagnostic imaging of heart and coronary circulation: Secondary | ICD-10-CM | POA: Diagnosis not present

## 2021-10-31 NOTE — Progress Notes (Signed)
Electrophysiology Office Note   Date:  10/31/2021   ID:  Elijah Stone, DOB Feb 05, 1931, MRN [id number]  PCP:  Pcp, No  Cardiologist:   Primary Electrophysiologist:  Daishia Fetterly Meredith Leeds, MD    Chief Complaint: AF   History of Present Illness: Elijah Stone is a 88 y.o. male who is being seen today for the evaluation of AF at the request of Janine Ores, NP. Presenting today for electrophysiology evaluation.  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  He had a watchman implanted in 2017.  He had a arachnoid hematoma and thus the watchman was implanted.    He presented to the hospital May 2023 with cryptogenic stroke.  He had a facial droop left upper extremity weakness, dysarthria.  He underwent thrombectomy.  He had a TEE that showed incomplete closure of his left atrial appendage with his watchman and was started on Eliquis.  Today, he denies symptoms of palpitations, chest pain, shortness of breath, orthopnea, PND, lower extremity edema, claudication, dizziness, presyncope, syncope, bleeding, or neurologic sequela. The patient is tolerating medications without difficulties.  Today his main complaint is fatigue.  He is weak, but is able to do all of his daily activities, working in the yard, doing dishes, making beds.  He does state that he sleeps more often.  He feels that he is recovering well from his stroke.   Past Medical History:  Diagnosis Date   Atrial fibrillation [facility])    Past Surgical History:  Procedure Laterality Date   IR CT HEAD LTD  09/12/2021   IR CT HEAD LTD  09/12/2021   IR PERCUTANEOUS ART THROMBECTOMY/INFUSION INTRACRANIAL INC DIAG ANGIO  09/12/2021   RADIOLOGY WITH ANESTHESIA N/A 09/12/2021   Procedure: IR WITH ANESTHESIA;  Surgeon: Radiologist, Medication, MD;  Location: [place];  Service: Radiology;  Laterality: N/A;     Current Outpatient Medications  Medication Sig Dispense  Refill   Acetaminophen (ACETAMIN PO) Take 1 tablet by mouth 2 (two) times daily as needed (mild pain).     apixaban (ELIQUIS) 2.5 MG TABS tablet Take 1 tablet (2.5 mg total) by mouth 2 (two) times daily. 60 tablet 3   aspirin EC 81 MG tablet Take 81 mg by mouth daily. Swallow whole.     cholecalciferol (VITAMIN D3) 25 MCG (1000 UNIT) tablet Take 1,000 Units by mouth daily.     Cyanocobalamin (B-12) 500 MCG TABS Take by mouth. Once daily     cycloSPORINE (RESTASIS) 0.05 % ophthalmic emulsion Place 1 drop into both eyes 2 (two) times daily.     fluocinonide cream (LIDEX) 6.21 % Apply 1 application. topically 2 (two) times daily as needed (rash).     fluticasone (FLONASE) 50 MCG/ACT nasal spray Place 1 spray into both nostrils daily.     latanoprost (XALATAN) 0.005 % ophthalmic solution Place 1 drop into both eyes at bedtime.     levocetirizine (XYZAL) 5 MG tablet Take 5 mg by mouth daily as needed for allergies.     levothyroxine (SYNTHROID) 88 MCG tablet Take 88 mcg by mouth daily before breakfast.     mupirocin ointment (BACTROBAN) 2 % Apply 1 application. topically 2 (two) times daily as needed (rash).     omeprazole (PRILOSEC) 40 MG capsule Take 40 mg by mouth daily.     polyethylene glycol powder (MIRALAX) 17 GM/SCOOP powder Take 17 g by mouth daily.     Polyvinyl Alcohol-Povidone (REFRESH OP) Place  1 drop into both eyes in the morning and at bedtime.     rosuvastatin (CRESTOR) 20 MG tablet Take 1 tablet (20 mg total) by mouth daily. 30 tablet 1   sacubitril-valsartan (ENTRESTO) 24-26 MG Take 1 tablet by mouth at bedtime.     Sodium Chloride, Hypertonic, (MURO 128 OP) Apply 1 drop to eye 2 (two) times daily.     No current facility-administered medications for this visit.    Allergies:   Patient has no known allergies.   Social History:  The patient  reports that he quit smoking about 67 years ago. His smoking use included cigarettes. He has a 4.00 pack-year smoking history. He has never  used smokeless tobacco. He reports that he does not currently use alcohol. He reports that he does not use drugs.   Family History:  The patient's family history includes Heart disease in his sister.    ROS:  Please see the history of present illness.   Otherwise, review of systems is positive for none.   All other systems are reviewed and negative.    PHYSICAL EXAM: VS:  BP (!) 96/52   Pulse 86   Ht '6\' 3"'$  (1.905 m)   Wt 166 lb 9.6 oz (75.6 kg)   SpO2 96%   BMI 20.82 kg/m  , BMI Body mass index is 20.82 kg/m. GEN: Well nourished, well developed, in no acute distress  HEENT: normal  Neck: no JVD, carotid bruits, or masses Cardiac: irregualr; no murmurs, rubs, or gallops,no edema  Respiratory:  clear to auscultation bilaterally, normal work of breathing GI: soft, nontender, nondistended, + BS MS: no deformity or atrophy  Skin: warm and dry Neuro:  Strength and sensation are intact Psych: euthymic mood, full affect  EKG:  EKG is not ordered today. Personal review of the ekg ordered 09/13/21 shows atrial fibrillation, PVC   Recent Labs: 09/13/2021: ALT 22; Hemoglobin 10.8; Platelets 118; TSH 0.212 09/14/2021: BUN 20; Creatinine, Ser 0.93; Magnesium 2.0; Potassium 4.4; Sodium 140    Lipid Panel     Component Value Date/Time   CHOL 175 09/12/2021 1745   TRIG 37 09/13/2021 0540   HDL 59 09/12/2021 1745   CHOLHDL 3.0 09/12/2021 1745   VLDL 13 09/12/2021 1745   LDLCALC 103 (H) 09/12/2021 1745     Wt Readings from Last 3 Encounters:  10/31/21 166 lb 9.6 oz (75.6 kg)  09/12/21 171 lb 15.3 oz (78 kg)      Other studies Reviewed: Additional studies/ records that were reviewed today include: TEE 09/13/21  Review of the above records today demonstrates:   1. Left ventricular ejection fraction, by estimation, is 25 to 30%. The  left ventricle has severely decreased function. The left ventricle  demonstrates global hypokinesis. The left ventricular internal cavity size  was  mildly dilated. Left ventricular  diastolic function could not be evaluated.   2. Right ventricular systolic function is normal. The right ventricular  size is normal.   3. There is a well seated Watchman left atrial appendage occluder. There  is a leak at the anterolateral aspect of the device with a width of  approximately 5 mm and a circumferential extent of approximately 45  degrees. There is no device associated  thrombus. Left atrial size was severely dilated. No left atrial/left  atrial appendage thrombus was detected.   4. Right atrial size was severely dilated.   5. The mitral valve is normal in structure. Mild mitral valve  regurgitation.   6.  Tricuspid valve regurgitation is mild to moderate.   7. The aortic valve is tricuspid. Aortic valve regurgitation is not  visualized. No aortic stenosis is present.   8. There is mild (Grade II) plaque involving the descending aorta.    ASSESSMENT AND PLAN:  1.  Permanent atrial fibrillation: CHA2DS2-VASc of at least 5.  He is currently on Eliquis 5 mg twice daily.  No rate controlling medications at this time as his heart rate is well controlled.  He does have a watchman but it is not completely closed.  I have asked him to discuss risks of intracranial hemorrhage with his neurologist to determine indications for anticoagulation.  2.  Chronic systolic heart failure: Has weakness and fatigue.  His blood pressures are low today in clinic.  His metoprolol has been stopped.  He is taking Entresto on a daily basis.  We Flora Ratz have him hold his Entresto to see if this helps with blood pressure and helps with his fatigue.  We Traevion Poehler also have him establish with general cardiology.    Current medicines are reviewed at length with the patient today.   The patient does not have concerns regarding his medicines.  The following changes were made today:  hold entresto  Labs/ tests ordered today include:  Orders Placed This Encounter  Procedures    Ambulatory referral to Cardiology     Disposition:   FU with Milee Qualls 6 months  Signed, Farrie Sann Meredith Leeds, MD  10/31/2021 4:47 PM     [place] [address] [place] [address] [phone] (office) [phone] (fax)

## 2021-10-31 NOTE — Patient Instructions (Signed)
Medication Instructions:  Your physician recommends that you continue on your current medications as directed. Please refer to the Current Medication list given to you today.  *If you need a refill on your cardiac medications before your next appointment, please call your pharmacy*   Lab Work: None ordered   Testing/Procedures: None ordered   Follow-Up: At [facility], you and your health needs are our priority.  As part of our continuing mission to provide you with exceptional heart care, we have created designated Provider Care Teams.  These Care Teams include your primary Cardiologist (physician) and Advanced Practice Providers (APPs -  Physician Assistants and Nurse Practitioners) who all work together to provide you with the care you need, when you need it.  Your next appointment:   6 month(s) in [facility]  The format for your next appointment:   In Person  Provider:   Allegra Lai, MD   You have been referred to a general cardiologist in [facility]   Thank you for choosing [facility]!!   Trinidad Curet, RN [phone]   Other Instructions Hold your Entresto.  If you feel better/have less issues after holding it, you can discuss treatment plan with the cardiologist you establish with in [facility].    Important Information About Sugar

## 2021-11-02 ENCOUNTER — Telehealth: Payer: Self-pay | Admitting: Cardiology

## 2021-11-02 NOTE — Telephone Encounter (Signed)
Received call, patient in the chair, at [facility]. I advised that patient's primary cardiologist is [facility] and I do not have full access to patient's history. In regards to recent stroke, I advised caller to contact neurology.  Emmaline Life, NP-C    11/02/2021, 10:31 AM [place] [address] Office [phone] Fax [phone]

## 2021-11-02 NOTE — Telephone Encounter (Signed)
   Pre-operative Risk Assessment    Patient Name: Elijah Stone  DOB: May 21, 1930 MRN: [id number]     Request for Surgical Clearance    Procedure:   Cleaning   Date of Surgery:  Clearance 11/02/21                                 Surgeon:  Dr. Glennon Hamilton Surgeon's Group or Practice Name:  [facility]    Phone number:  [phone] Fax number:  [phone]   Type of Clearance Requested:   - Pharmacy:  Hold      Making sure a regular cleaning can be done.    Type of Anesthesia:  None    Additional requests/questions:  They want to know if they can proceed with regular prophylaxis  Signed, April Henson   11/02/2021, 10:12 AM

## 2021-11-30 ENCOUNTER — Ambulatory Visit: Payer: Medicare Other | Admitting: Neurology

## 2021-11-30 ENCOUNTER — Encounter: Payer: Self-pay | Admitting: Neurology

## 2021-11-30 VITALS — BP 94/52 | HR 86 | Ht 75.0 in | Wt 166.6 lb

## 2021-11-30 DIAGNOSIS — I482 Chronic atrial fibrillation, unspecified: Secondary | ICD-10-CM

## 2021-11-30 DIAGNOSIS — I63411 Cerebral infarction due to embolism of right middle cerebral artery: Secondary | ICD-10-CM | POA: Diagnosis not present

## 2021-11-30 DIAGNOSIS — T82539A Leakage of unspecified cardiac and vascular devices and implants, initial encounter: Secondary | ICD-10-CM | POA: Diagnosis not present

## 2021-11-30 NOTE — Patient Instructions (Signed)
I had a long d/w patient I about his recent embolic stroke, chronic atrial fibrillation leakage from Watchman device, risk for recurrent stroke/TIAs, personally independently reviewed imaging studies and stroke evaluation results and answered questions.Continue Eliquis (apixaban) 2.5 mg twice daily  for secondary stroke prevention and maintain strict control of hypertension with blood pressure goal below 130/90, diabetes with hemoglobin A1c goal below 6.5% and lipids with LDL cholesterol goal below 70 mg/dL. I also advised the patient to eat a healthy diet with plenty of whole grains, cereals, fruits and vegetables, exercise regularly and maintain ideal body weight.  I have advised him to follow-up with his primary care physician for complaints of shortness of breath, decreased stamina and sound in his left ear.  Followup in the future with me in 6 months or call earlier if necessary.  Stroke Prevention Some medical conditions and behaviors can lead to a higher chance of having a stroke. You can help prevent a stroke by eating healthy, exercising, not smoking, and managing any medical conditions you have. Stroke is a leading cause of functional impairment. Primary prevention is particularly important because a majority of strokes are first-time events. Stroke changes the lives of not only those who experience a stroke but also their family and other caregivers. How can this condition affect me? A stroke is a medical emergency and should be treated right away. A stroke can lead to brain damage and can sometimes be life-threatening. If a person gets medical treatment right away, there is a better chance of surviving and recovering from a stroke. What can increase my risk? The following medical conditions may increase your risk of a stroke: Cardiovascular disease. High blood pressure (hypertension). Diabetes. High cholesterol. Sickle cell disease. Blood clotting disorders (hypercoagulable  state). Obesity. Sleep disorders (obstructive sleep apnea). Other risk factors include: Being older than age 85. Having a history of blood clots, stroke, or mini-stroke (transient ischemic attack, TIA). Genetic factors, such as race, ethnicity, or a family history of stroke. Smoking cigarettes or using other tobacco products. Taking birth control pills, especially if you also use tobacco. Heavy use of alcohol or drugs, especially cocaine and methamphetamine. Physical inactivity. What actions can I take to prevent this? Manage your health conditions High cholesterol levels. Eating a healthy diet is important for preventing high cholesterol. If cholesterol cannot be managed through diet alone, you may need to take medicines. Take any prescribed medicines to control your cholesterol as told by your health care provider. Hypertension. To reduce your risk of stroke, try to keep your blood pressure below 130/80. Eating a healthy diet and exercising regularly are important for controlling blood pressure. If these steps are not enough to manage your blood pressure, you may need to take medicines. Take any prescribed medicines to control hypertension as told by your health care provider. Ask your health care provider if you should monitor your blood pressure at home. Have your blood pressure checked every year, even if your blood pressure is normal. Blood pressure increases with age and some medical conditions. Diabetes. Eating a healthy diet and exercising regularly are important parts of managing your blood sugar (glucose). If your blood sugar cannot be managed through diet and exercise, you may need to take medicines. Take any prescribed medicines to control your diabetes as told by your health care provider. Get evaluated for obstructive sleep apnea. Talk to your health care provider about getting a sleep evaluation if you snore a lot or have excessive sleepiness. Make sure that any  other  medical conditions you have, such as atrial fibrillation or atherosclerosis, are managed. Nutrition Follow instructions from your health care provider about what to eat or drink to help manage your health condition. These instructions may include: Reducing your daily calorie intake. Limiting how much salt (sodium) you use to 1,500 milligrams (mg) each day. Using only healthy fats for cooking, such as olive oil, canola oil, or sunflower oil. Eating healthy foods. You can do this by: Choosing foods that are high in fiber, such as whole grains, and fresh fruits and vegetables. Eating at least 5 servings of fruits and vegetables a day. Try to fill one-half of your plate with fruits and vegetables at each meal. Choosing lean protein foods, such as lean cuts of meat, poultry without skin, fish, tofu, beans, and nuts. Eating low-fat dairy products. Avoiding foods that are high in sodium. This can help lower blood pressure. Avoiding foods that have saturated fat, trans fat, and cholesterol. This can help prevent high cholesterol. Avoiding processed and prepared foods. Counting your daily carbohydrate intake.  Lifestyle If you drink alcohol: Limit how much you have to: 0-1 drink a day for women who are not pregnant. 0-2 drinks a day for men. Know how much alcohol is in your drink. In the [place] one drink equals one 12 oz bottle of beer (335m), one 5 oz glass of wine (141m, or one 1 oz glass of hard liquor (4428m Do not use any products that contain nicotine or tobacco. These products include cigarettes, chewing tobacco, and vaping devices, such as e-cigarettes. If you need help quitting, ask your health care provider. Avoid secondhand smoke. Do not use drugs. Activity  Try to stay at a healthy weight. Get at least 30 minutes of exercise on most days, such as: Fast walking. Biking. Swimming. Medicines Take over-the-counter and prescription medicines only as told by your health care  provider. Aspirin or blood thinners (antiplatelets or anticoagulants) may be recommended to reduce your risk of forming blood clots that can lead to stroke. Avoid taking birth control pills. Talk to your health care provider about the risks of taking birth control pills if: You are over 35 69ars old. You smoke. You get very bad headaches. You have had a blood clot. Where to find more information American Stroke Association: www.strokeassociation.org Get help right away if: You or a loved one has any symptoms of a stroke. "BE FAST" is an easy way to remember the main warning signs of a stroke: B - Balance. Signs are dizziness, sudden trouble walking, or loss of balance. E - Eyes. Signs are trouble seeing or a sudden change in vision. F - Face. Signs are sudden weakness or numbness of the face, or the face or eyelid drooping on one side. A - Arms. Signs are weakness or numbness in an arm. This happens suddenly and usually on one side of the body. S - Speech. Signs are sudden trouble speaking, slurred speech, or trouble understanding what people say. T - Time. Time to call emergency services. Write down what time symptoms started. You or a loved one has other signs of a stroke, such as: A sudden, severe headache with no known cause. Nausea or vomiting. Seizure. These symptoms may represent a serious problem that is an emergency. Do not wait to see if the symptoms will go away. Get medical help right away. Call your local emergency services (911 in the [place] Do not drive yourself to the hospital. Summary You can help to prevent  a stroke by eating healthy, exercising, not smoking, limiting alcohol intake, and managing any medical conditions you may have. Do not use any products that contain nicotine or tobacco. These include cigarettes, chewing tobacco, and vaping devices, such as e-cigarettes. If you need help quitting, ask your health care provider. Remember "BE FAST" for warning signs of a  stroke. Get help right away if you or a loved one has any of these signs. This information is not intended to replace advice given to you by your health care provider. Make sure you discuss any questions you have with your health care provider. Document Revised: 11/10/2019 Document Reviewed: 11/10/2019 Elsevier Patient Education  [place].

## 2021-11-30 NOTE — Progress Notes (Signed)
[facility] [address] [place]. [place] [address] [phone]       OFFICE FOLLOW-UP NOTE  [name] Date of Birth:  1930-11-13 Medical Record Number:  [id number]   HPI: [name] is a pleasant 88 year old Caucasian male seen today for initial office follow-up visit following hospital admission for stroke in the May 2023.  He is accompanied by his wife today.  History is obtained from them and review of electronic medical records and I personally reviewed pertinent available imaging in PACS.  Elijah Stone is a 88 y.o. male  [place] in 2021, afib with watchman on aspirin, CHF, SAH, pancytopenia, HTN, hypothyroidism, BPH, OSA with cpap initially presenting with left facial droop, left arm weakness, and dysarthria. Symptoms fluctuated throughout assessment and CT scan. CTA showed a right M2 occlusion, code IR activated.  NIH stroke scale was 7 on admission.  CT scan was unremarkable with aspect score of 10.  CT angiogram showed mid right M2 occlusion and moderate proximal left P2 and mild to moderate left M2 stenosis.  MRI scan showed acute infarct involving right insula, right temporal lobe and mild subarachnoid hemorrhage along the right temporal and frontoparietal regions.  2D echo showed ejection fraction of 30 to 35% with grade 3 diastolic dysfunction and severely dilated left atrium.  TEE performed to visualize the Watchman device showed a 5 mm right Fluorographic 45 degrees at the anterior lateral aspect of the device.  No definite clot was noted.  LDL cholesterol was 103 mg percent and hemoglobin A1c 5.7.  Patient had been on warfarin in the past for his A-fib but stopped taking it after developing traumatic subarachnoid hemorrhage underwent Watchman device procedure and dysarthria 8 to 10 years ago.  Patient was initially started on aspirin due to subarachnoid hemorrhage and.  Resolved few weeks later he was switched to Eliquis due to his age he is currently on 2.5 mg twice  daily which is tolerating well with minor bruising but no bleeding episodes.  His blood pressure tends to run low and his metoprolol was discontinued but still 94/54 today.  Physical occupational and speech therapy.  He states he has no physical weakness from stroke but does complain of generalized weakness.  He gets short of breath with exertion.  He is also complaining of further ringing sound in his left ear this is like a buzzing sound off a elective motor.  He has not yet discussed this with his primary care physician.   ROS:   14 system review of systems is positive for decreased hearing, gait difficulty, bruising, decreased stamina, weakness, shortness of breath with exertion all other systems negative  PMH:  Past Medical History:  Diagnosis Date   Atrial fibrillation (HCC)    CVA (cerebral vascular accident) ([place])    Hard of hearing     Social History:  Social History   Socioeconomic History   Marital status: Married    Spouse name: Not on file   Number of children: 4   Years of education: college   Highest education level: Master's degree (e.g., MA, MS, MEng, MEd, MSW, MBA)  Occupational History   Occupation: Retired Theme park manager  Tobacco Use   Smoking status: Former    Packs/day: 1.00    Years: 4.00    Total pack years: 4.00    Types: Cigarettes    Quit date: 1956    Years since quitting: 67.6   Smokeless tobacco: Never  Substance and Sexual Activity   Alcohol use:  Not Currently   Drug use: Never   Sexual activity: Not on file  Other Topics Concern   Not on file  Social History Narrative   Lives at home with his wife.   Right-handed.   Caffeine use: 3 cups per day (coffee - 1/2 caff)   Social Determinants of Health   Financial Resource Strain: Not on file  Food Insecurity: Not on file  Transportation Needs: Not on file  Physical Activity: Not on file  Stress: Not on file  Social Connections: Not on file  Intimate Partner Violence: Not on file    Medications:    Current Outpatient Medications on File Prior to Visit  Medication Sig Dispense Refill   acetaminophen (TYLENOL) 500 MG tablet Take 500 mg by mouth at bedtime.     apixaban (ELIQUIS) 2.5 MG TABS tablet Take 1 tablet (2.5 mg total) by mouth 2 (two) times daily. 60 tablet 3   cholecalciferol (VITAMIN D3) 25 MCG (1000 UNIT) tablet Take 1,000 Units by mouth daily.     Cyanocobalamin (B-12) 500 MCG TABS Take by mouth. Once daily     cycloSPORINE (RESTASIS) 0.05 % ophthalmic emulsion Place 1 drop into both eyes 2 (two) times daily.     fluocinonide cream (LIDEX) 5.64 % Apply 1 application. topically 2 (two) times daily as needed (rash).     fluticasone (FLONASE) 50 MCG/ACT nasal spray Place 1 spray into both nostrils daily.     latanoprost (XALATAN) 0.005 % ophthalmic solution Place 1 drop into both eyes at bedtime.     levocetirizine (XYZAL) 5 MG tablet Take 5 mg by mouth daily as needed for allergies.     levothyroxine (SYNTHROID) 88 MCG tablet Take 88 mcg by mouth daily before breakfast.     mupirocin ointment (BACTROBAN) 2 % Apply 1 application. topically 2 (two) times daily as needed (rash).     omeprazole (PRILOSEC) 40 MG capsule Take 40 mg by mouth daily.     polyethylene glycol powder (MIRALAX) 17 GM/SCOOP powder Take 17 g by mouth daily.     Polyvinyl Alcohol-Povidone (REFRESH OP) Place 1 drop into both eyes in the morning and at bedtime.     rosuvastatin (CRESTOR) 20 MG tablet Take 1 tablet (20 mg total) by mouth daily. 30 tablet 1   sacubitril-valsartan (ENTRESTO) 24-26 MG Take 1 tablet by mouth at bedtime.     Sodium Chloride, Hypertonic, (MURO 128 OP) Apply 1 drop to eye 2 (two) times daily.     No current facility-administered medications on file prior to visit.    Allergies:  No Known Allergies  Physical Exam General: Frail elderly Caucasian male, seated, in no evident distress Head: head normocephalic and atraumatic.  Neck: supple with no carotid or supraclavicular  bruits Cardiovascular: regular rate and rhythm, no murmurs Musculoskeletal: no deformity Skin:  no rash/petichiae Vascular:  Normal pulses all extremities Vitals:   11/30/21 1015  BP: (!) 94/52  Pulse: 86   Neurologic Exam Mental Status: Awake and fully alert. Oriented to place and time. Recent and remote memory intact. Attention span, concentration and fund of knowledge appropriate. Mood and affect appropriate.  Cranial Nerves: Fundoscopic exam reveals sharp disc margins. Pupils equal, briskly reactive to light. Extraocular movements full without nystagmus. Visual fields full to confrontation. Hearing diminished bilaterally.  Facial sensation intact. Face, tongue, palate moves normally and symmetrically.  Motor: Normal bulk and tone. Normal strength in all tested extremity muscles.  Diminished fine finger movements on the left.  Orbits  right over left upper extremity.  Flaccid left leg a little bit while walking. Sensory.: intact to touch ,pinprick .position and vibratory sensation.  Coordination: Rapid alternating movements normal in all extremities. Finger-to-nose and heel-to-shin performed accurately bilaterally. Gait and Station: Arises from chair without difficulty. Stance is normal. Gait demonstrates normal stride length and balance . Able to heel, toe and tandem walk with moderate difficulty.  Reflexes: 1+ and symmetric. Toes downgoing.   NIHSS  0 Modified Rankin  1   ASSESSMENT: 88 year old Caucasian male with right MCA infarct secondary to right M2 occlusion s/p mechanical thrombectomy secondary to embolism in the setting of atrial fibrillation not on anticoagulation s/p Watchman procedure with a leak.  Vascular risk factors of hypertension and atrial fibrillation and age     PLAN: I had a long d/w patient I about his recent embolic stroke, chronic atrial fibrillation leakage from Watchman device, risk for recurrent stroke/TIAs, personally independently reviewed imaging studies  and stroke evaluation results and answered questions.Continue Eliquis (apixaban) 2.5 mg twice daily  for secondary stroke prevention and maintain strict control of hypertension with blood pressure goal below 130/90, diabetes with hemoglobin A1c goal below 6.5% and lipids with LDL cholesterol goal below 70 mg/dL. I also advised the patient to eat a healthy diet with plenty of whole grains, cereals, fruits and vegetables, exercise regularly and maintain ideal body weight.  I have advised him to follow-up with his primary care physician for complaints of shortness of breath, decreased stamina and sound in his left ear.  Followup in the future with me in 6 months or call earlier if necessary. Greater than 50% of time during this 35 minute visit was spent on counseling,explanation of diagnosis embolic stroke, atrial fibrillation, risk benefit of anticoagulation with Eliquis, planning of further management, discussion with patient and family and coordination of care Elijah Contras, MD Note: This document was prepared with digital dictation and possible smart phrase technology. Any transcriptional errors that result from this process are unintentional

## 2021-12-21 NOTE — Progress Notes (Deleted)
HPI: Follow-up nonischemic cardiomyopathy and atrial fibrillation.  This is my first time evaluating the patient.  Apparently had cardiac catheterization in [place] years ago showing no coronary disease but records not available.  Patient had a subarachnoid hemorrhage in the past and had watchman placed in [place].  He was admitted to [facility] in May 2023 with stroke.  He underwent thrombectomy.  Echocardiogram May 2023 showed ejection fraction 30 to 30%, grade 3 diastolic dysfunction, severe biatrial enlargement,, mild to moderate mitral regurgitation, mild to moderate tricuspid regurgitation.  Transesophageal echocardiogram May 2023 showed ejection fraction 25 to 30%, mild left ventricular enlargement, well-seated Watchman device with a leak at the anterolateral aspect, severe left atrial enlargement, severe right atrial enlargement, mild mitral regurgitation, mild to moderate tricuspid regurgitation.  ABIs May 2023 noncompressible bilaterally with abnormal toe to brachial index.  Since last seen  Current Outpatient Medications  Medication Sig Dispense Refill   acetaminophen (TYLENOL) 500 MG tablet Take 500 mg by mouth at bedtime.     apixaban (ELIQUIS) 2.5 MG TABS tablet Take 1 tablet (2.5 mg total) by mouth 2 (two) times daily. 60 tablet 3   cholecalciferol (VITAMIN D3) 25 MCG (1000 UNIT) tablet Take 1,000 Units by mouth daily.     Cyanocobalamin (B-12) 500 MCG TABS Take by mouth. Once daily     cycloSPORINE (RESTASIS) 0.05 % ophthalmic emulsion Place 1 drop into both eyes 2 (two) times daily.     fluocinonide cream (LIDEX) 8.65 % Apply 1 application. topically 2 (two) times daily as needed (rash).     fluticasone (FLONASE) 50 MCG/ACT nasal spray Place 1 spray into both nostrils daily.     latanoprost (XALATAN) 0.005 % ophthalmic solution Place 1 drop into both eyes at bedtime.     levocetirizine (XYZAL) 5 MG tablet Take 5 mg by mouth daily as needed for allergies.     levothyroxine  (SYNTHROID) 88 MCG tablet Take 88 mcg by mouth daily before breakfast.     mupirocin ointment (BACTROBAN) 2 % Apply 1 application. topically 2 (two) times daily as needed (rash).     omeprazole (PRILOSEC) 40 MG capsule Take 40 mg by mouth daily.     polyethylene glycol powder (MIRALAX) 17 GM/SCOOP powder Take 17 g by mouth daily.     Polyvinyl Alcohol-Povidone (REFRESH OP) Place 1 drop into both eyes in the morning and at bedtime.     rosuvastatin (CRESTOR) 20 MG tablet Take 1 tablet (20 mg total) by mouth daily. 30 tablet 1   sacubitril-valsartan (ENTRESTO) 24-26 MG Take 1 tablet by mouth at bedtime.     Sodium Chloride, Hypertonic, (MURO 128 OP) Apply 1 drop to eye 2 (two) times daily.     No current facility-administered medications for this visit.     Past Medical History:  Diagnosis Date   Atrial fibrillation [facility])    CVA (cerebral vascular accident) ([place])    Hard of hearing     Past Surgical History:  Procedure Laterality Date   IR CT HEAD LTD  09/12/2021   IR CT HEAD LTD  09/12/2021   IR PERCUTANEOUS ART THROMBECTOMY/INFUSION INTRACRANIAL INC DIAG ANGIO  09/12/2021   RADIOLOGY WITH ANESTHESIA N/A 09/12/2021   Procedure: IR WITH ANESTHESIA;  Surgeon: Radiologist, Medication, MD;  Location: [facility];  Service: Radiology;  Laterality: N/A;    Social History   Socioeconomic History   Marital status: Married    Spouse name: Not on file   Number of children:  4   Years of education: college   Highest education level: Master's degree (e.g., MA, MS, MEng, MEd, MSW, MBA)  Occupational History   Occupation: Retired Theme park manager  Tobacco Use   Smoking status: Former    Packs/day: 1.00    Years: 4.00    Total pack years: 4.00    Types: Cigarettes    Quit date: 1956    Years since quitting: 67.7   Smokeless tobacco: Never  Substance and Sexual Activity   Alcohol use: Not Currently   Drug use: Never   Sexual activity: Not on file  Other Topics Concern   Not on file  Social History  Narrative   Lives at home with his wife.   Right-handed.   Caffeine use: 3 cups per day (coffee - 1/2 caff)   Social Determinants of Health   Financial Resource Strain: Not on file  Food Insecurity: Not on file  Transportation Needs: Not on file  Physical Activity: Not on file  Stress: Not on file  Social Connections: Not on file  Intimate Partner Violence: Not on file    Family History  Problem Relation Age of Onset   Heart disease Sister     ROS: no fevers or chills, productive cough, hemoptysis, dysphasia, odynophagia, melena, hematochezia, dysuria, hematuria, rash, seizure activity, orthopnea, PND, pedal edema, claudication. Remaining systems are negative.  Physical Exam: Well-developed well-nourished in no acute distress.  Skin is warm and dry.  HEENT is normal.  Neck is supple.  Chest is clear to auscultation with normal expansion.  Cardiovascular exam is regular rate and rhythm.  Abdominal exam nontender or distended. No masses palpated. Extremities show no edema. neuro grossly intact  ECG- personally reviewed  A/P  1 permanent atrial fibrillation-patient had a previous watchman placed but had a recent CVA and transesophageal echocardiogram showed peridevice leak.  We will therefore continue apixaban.  2 nonischemic cardiomyopathy-  3 hypertension-  4 hyperlipidemia-  Kirk Ruths, MD

## 2021-12-22 ENCOUNTER — Other Ambulatory Visit: Payer: Self-pay

## 2021-12-22 NOTE — Patient Outreach (Signed)
[facility]) Care Management  12/22/2021  Elijah Stone February 24, 1931 [id number]   No telephone outreach to patient to obtain mRS was successfully completed by Dr. Leonie Man on 11/30/21. MRS=1  [place] Care Management Assistant [phone]

## 2021-12-26 ENCOUNTER — Emergency Department (HOSPITAL_COMMUNITY)
Admission: EM | Admit: 2021-12-26 | Discharge: 2021-12-26 | Disposition: A | Discharge status: Home / Self Care | Payer: Medicare Other | Attending: Emergency Medicine | Admitting: Emergency Medicine

## 2021-12-26 ENCOUNTER — Emergency Department (HOSPITAL_COMMUNITY): Payer: Medicare Other

## 2021-12-26 ENCOUNTER — Other Ambulatory Visit: Payer: Self-pay

## 2021-12-26 DIAGNOSIS — I6782 Cerebral ischemia: Secondary | ICD-10-CM | POA: Insufficient documentation

## 2021-12-26 DIAGNOSIS — Z87891 Personal history of nicotine dependence: Secondary | ICD-10-CM | POA: Insufficient documentation

## 2021-12-26 DIAGNOSIS — S42202A Unspecified fracture of upper end of left humerus, initial encounter for closed fracture: Secondary | ICD-10-CM | POA: Diagnosis not present

## 2021-12-26 DIAGNOSIS — I4891 Unspecified atrial fibrillation: Secondary | ICD-10-CM | POA: Diagnosis not present

## 2021-12-26 DIAGNOSIS — M542 Cervicalgia: Secondary | ICD-10-CM | POA: Insufficient documentation

## 2021-12-26 DIAGNOSIS — W19XXXA Unspecified fall, initial encounter: Secondary | ICD-10-CM | POA: Diagnosis not present

## 2021-12-26 DIAGNOSIS — Z7901 Long term (current) use of anticoagulants: Secondary | ICD-10-CM | POA: Insufficient documentation

## 2021-12-26 DIAGNOSIS — S6992XA Unspecified injury of left wrist, hand and finger(s), initial encounter: Secondary | ICD-10-CM | POA: Diagnosis present

## 2021-12-26 MED ORDER — HYDROCODONE-ACETAMINOPHEN 5-325 MG PO TABS: 1.0000 | ORAL_TABLET | ORAL | 0 refills | Status: DC | PRN

## 2021-12-26 MED ORDER — HYDROCODONE-ACETAMINOPHEN 5-325 MG PO TABS
1.0000 | ORAL_TABLET | Freq: Once | ORAL | Status: AC
  Administered 2021-12-26: 1 via ORAL
  Filled 2021-12-26: qty 1

## 2021-12-26 NOTE — ED Notes (Signed)
The pt went to  c-t and returend  he removed his hearing aids for the c-t and he brought them back  wedding band removed from the lt ring finger

## 2021-12-26 NOTE — Progress Notes (Signed)
Orthopedic Tech Progress Note Patient Details:  Elijah Stone 1930/06/23 [id number]  Ortho Devices Type of Ortho Device: Sling immobilizer Ortho Device/Splint Location: lue Ortho Device/Splint Interventions: Ordered, Application, Adjustment   Post Interventions Patient Tolerated: Well Instructions Provided: Care of device, Adjustment of device  Karolee Stamps 12/26/2021, 4:10 AM

## 2021-12-26 NOTE — Progress Notes (Signed)
Orthopedic Tech Progress Note Patient Details:  Elijah Stone Mar 20, 1931 [id number]  Patient ID: Elijah Stone, male   DOB: [birth year], 88 y.o.   MRN: [id number] I attended trauma page. Karolee Stamps 12/26/2021, 4:06 AM

## 2021-12-26 NOTE — ED Provider Notes (Signed)
[facility] Emergency Department Provider Note MRN:  [id number]  Arrival date & time: 12/26/21     Chief Complaint   Fall History of Present Illness   Elijah Stone is a 88 y.o. year-old male with a history of A-fib presenting to the ED with chief complaint of fall.  Patient presenting as a level 2 trauma, fall on thinners.  Stumbled and fell, mechanical fall.  Fell onto left shoulder, having moderate to severe left shoulder pain.  Hit his head but did not lose consciousness, endorsing some mild neck pain as well, no chest pain or shortness of breath, no abdominal pain, no injuries to the legs.  Review of Systems  A thorough review of systems was obtained and all systems are negative except as noted in the HPI and PMH.   Patient's Health History    Past Medical History:  Diagnosis Date   Atrial fibrillation [facility])    CVA (cerebral vascular accident) ([place])    Hard of hearing     Past Surgical History:  Procedure Laterality Date   IR CT HEAD LTD  09/12/2021   IR CT HEAD LTD  09/12/2021   IR PERCUTANEOUS ART THROMBECTOMY/INFUSION INTRACRANIAL INC DIAG ANGIO  09/12/2021   RADIOLOGY WITH ANESTHESIA N/A 09/12/2021   Procedure: IR WITH ANESTHESIA;  Surgeon: Radiologist, Medication, MD;  Location: [place];  Service: Radiology;  Laterality: N/A;    Family History  Problem Relation Age of Onset   Heart disease Sister     Social History   Socioeconomic History   Marital status: Married    Spouse name: Not on file   Number of children: 4   Years of education: college   Highest education level: Master's degree (e.g., MA, MS, MEng, MEd, MSW, MBA)  Occupational History   Occupation: Retired Theme park manager  Tobacco Use   Smoking status: Former    Packs/day: 1.00    Years: 4.00    Total pack years: 4.00    Types: Cigarettes    Quit date: 1956    Years since quitting: 67.7   Smokeless tobacco: Never  Substance and Sexual Activity   Alcohol use: Not Currently   Drug  use: Never   Sexual activity: Not on file  Other Topics Concern   Not on file  Social History Narrative   Lives at home with his wife.   Right-handed.   Caffeine use: 3 cups per day (coffee - 1/2 caff)   Social Determinants of Health   Financial Resource Strain: Not on file  Food Insecurity: Not on file  Transportation Needs: Not on file  Physical Activity: Not on file  Stress: Not on file  Social Connections: Not on file  Intimate Partner Violence: Not on file     Physical Exam   Vitals:   12/26/21 0215 12/26/21 0500  BP: (!) 149/87 (!) 140/71  Pulse: 91 92  Resp: 12 14  Temp:    SpO2: 98% 97%    CONSTITUTIONAL: Well-appearing, NAD NEURO/PSYCH:  Alert and oriented x 3, no focal deficits EYES:  eyes equal and reactive ENT/NECK:  no LAD, no JVD CARDIO: Regular rate, well-perfused, normal S1 and S2 PULM:  CTAB no wheezing or rhonchi GI/GU:  non-distended, non-tender MSK/SPINE: Tenderness and severely limited range of motion of the left shoulder due to pain SKIN:  no rash, atraumatic   *Additional and/or pertinent findings included in MDM below  Diagnostic and Interventional Summary    EKG Interpretation  Date/Time:    Ventricular  Rate:    PR Interval:    QRS Duration:   QT Interval:    QTC Calculation:   R Axis:     Text Interpretation:         Labs Reviewed - No data to display  CT HEAD WO CONTRAST (5MM)  Final Result    CT CERVICAL SPINE WO CONTRAST  Final Result    DG Chest Port 1 View  Final Result    DG Shoulder Left Portable  Final Result      Medications  HYDROcodone-acetaminophen (NORCO/VICODIN) 5-325 MG per tablet 1 tablet (1 tablet Oral Given 12/26/21 0509)     Procedures  /  Critical Care Procedures  ED Course and Medical Decision Making  Initial Impression and Ddx Suspicion for humerus fracture.  Patient is also a fall on thinners, head trauma, will need CT to exclude intracranial bleeding.  No signs of intrathoracic or  intra-abdominal injury, reassuring vital signs.  Past medical/surgical history that increases complexity of ED encounter: A-fib  Interpretation of Diagnostics I personally reviewed the chest x-ray and my interpretation is as follows: No pneumothorax  Shoulder x-ray confirms humerus fracture.  CT imaging is reassuring  Patient Reassessment and Ultimate Disposition/Management     Patient continues to look comfortable with normal vital signs.  He does have a dimple to the left deltoid but does not have skin tenting or any blanching of the skin.  There is no defect to the skin, this is not an open fracture.  This finding in the displaced nature of the x-ray findings were discussed with Dr. Lyla Glassing, nothing to do other than an office follow-up.  Patient management required discussion with the following services or consulting groups:  Orthopedic Surgery  Complexity of Problems Addressed Acute illness or injury that poses threat of life of bodily function  Additional Data Reviewed and Analyzed Further history obtained from: Further history from spouse/family member  Additional Factors Impacting ED Encounter Risk Prescriptions  Barth Kirks. Sedonia Small, MD [facility] [email or web address]  Final Clinical Impressions(s) / ED Diagnoses     ICD-10-CM   1. Closed fracture of proximal end of left humerus, unspecified fracture morphology, initial encounter  S42.202A       ED Discharge Orders          Ordered    HYDROcodone-acetaminophen (NORCO/VICODIN) 5-325 MG tablet  Every 4 hours PRN        12/26/21 0557             Discharge Instructions Discussed with and Provided to Patient:    Discharge Instructions      You were evaluated in the Emergency Department and after careful evaluation, we did not find any emergent condition requiring admission or further testing in the hospital.  Your exam/testing today is overall reassuring.  Symptoms  seem to be due to a humerus fracture.  Use the sling provided and follow-up with the orthopedic specialist.  Can use the Norco for more significant pain at home.  Please return to the Emergency Department if you experience any worsening of your condition.   Thank you for allowing [place] to be a part of your care.      Maudie Flakes, MD 12/26/21 [phone]

## 2021-12-26 NOTE — ED Notes (Signed)
Pt arrives via EMS from home for mechanical fall on thinners (eliquis). Fell onto L side on floor, reports hitting back of head on floor, L shoulder deformity. Puncture wound noted to L palm.  No LOC. 148/86, afib (h/o same) 70-90bpm, 98% RA, 20R forearm Has received 78mg fentanyl with EMS

## 2021-12-26 NOTE — ED Notes (Signed)
The pts wife and son is at  the bedside

## 2021-12-26 NOTE — Discharge Instructions (Addendum)
You were evaluated in the Emergency Department and after careful evaluation, we did not find any emergent condition requiring admission or further testing in the hospital.  Your exam/testing today is overall reassuring.  Symptoms seem to be due to a humerus fracture.  Use the sling provided and follow-up with the orthopedic specialist.  Can use the Norco for more significant pain at home.  Please return to the Emergency Department if you experience any worsening of your condition.   Thank you for allowing [place] to be a part of your care.

## 2022-01-04 ENCOUNTER — Ambulatory Visit: Payer: Medicare Other | Admitting: Cardiology

## 2022-02-02 ENCOUNTER — Other Ambulatory Visit: Payer: Self-pay | Admitting: Neurology

## 2022-05-28 ENCOUNTER — Other Ambulatory Visit: Payer: Self-pay | Admitting: Neurology

## 2022-06-12 ENCOUNTER — Ambulatory Visit: Payer: Medicare Other | Admitting: Neurology

## 2022-06-26 ENCOUNTER — Ambulatory Visit: Payer: Medicare Other | Admitting: Neurology

## 2022-06-26 ENCOUNTER — Encounter: Payer: Self-pay | Admitting: Neurology

## 2022-06-26 VITALS — BP 106/68 | HR 83 | Ht 75.0 in | Wt 163.8 lb

## 2022-06-26 DIAGNOSIS — T82539A Leakage of unspecified cardiac and vascular devices and implants, initial encounter: Secondary | ICD-10-CM

## 2022-06-26 DIAGNOSIS — Z8673 Personal history of transient ischemic attack (TIA), and cerebral infarction without residual deficits: Secondary | ICD-10-CM

## 2022-06-26 DIAGNOSIS — I482 Chronic atrial fibrillation, unspecified: Secondary | ICD-10-CM | POA: Diagnosis not present

## 2022-06-26 DIAGNOSIS — H903 Sensorineural hearing loss, bilateral: Secondary | ICD-10-CM | POA: Insufficient documentation

## 2022-06-26 NOTE — Progress Notes (Signed)
[facility] [address] [place]. [place] [address] [phone]       OFFICE FOLLOW-UP NOTE  Mr. Abduljabbar Koski Date of Birth:  Oct 01, 1930 Medical Record Number:  [id number]   HPI: Initial visit 11/30/2021 Mr. Komara is a pleasant 89 year old Caucasian male seen today for initial office follow-up visit following hospital admission for stroke in the May 2023.  He is accompanied by his wife today.  History is obtained from them and review of electronic medical records and I personally reviewed pertinent available imaging in PACS.  Tykevious Kenderdine is a 89 y.o. male  [place] in 2021, afib with watchman on aspirin, CHF, SAH, pancytopenia, HTN, hypothyroidism, BPH, OSA with cpap initially presenting with left facial droop, left arm weakness, and dysarthria. Symptoms fluctuated throughout assessment and CT scan. CTA showed a right M2 occlusion, code IR activated.  NIH stroke scale was 7 on admission.  CT scan was unremarkable with aspect score of 10.  CT angiogram showed mid right M2 occlusion and moderate proximal left P2 and mild to moderate left M2 stenosis.  MRI scan showed acute infarct involving right insula, right temporal lobe and mild subarachnoid hemorrhage along the right temporal and frontoparietal regions.  2D echo showed ejection fraction of 30 to 35% with grade 3 diastolic dysfunction and severely dilated left atrium.  TEE performed to visualize the Watchman device showed a 5 mm right Fluorographic 45 degrees at the anterior lateral aspect of the device.  No definite clot was noted.  LDL cholesterol was 103 mg percent and hemoglobin A1c 5.7.  Patient had been on warfarin in the past for his A-fib but stopped taking it after developing traumatic subarachnoid hemorrhage underwent Watchman device procedure and dysarthria 8 to 10 years ago.  Patient was initially started on aspirin due to subarachnoid hemorrhage and.  Resolved few weeks later he was switched to Eliquis due to his age he is  currently on 2.5 mg twice daily which is tolerating well with minor bruising but no bleeding episodes.  His blood pressure tends to run low and his metoprolol was discontinued but still 94/54 today.  Physical occupational and speech therapy.  He states he has no physical weakness from stroke but does complain of generalized weakness.  He gets short of breath with exertion.  He is also complaining of further ringing sound in his left ear this is like a buzzing sound off a elective motor.  He has not yet discussed this with his primary care physician. Update 06/26/2022 : He returns for follow-up after last visit 6 months ago.  He is accompanied by his wife.  He states he is doing well.  He is tolerating Eliquis well without bleeding or bruising.  His blood pressure is well-controlled usually low.  Uses a cane and is careful with his walking.  No falls or injuries..  Fractured his left humerus about 5 months ago.  After conservative treatment.  He still has limitation of left shoulder.  He still mostly independent in most activities and needs only little help with taking a shower.  He continues to live in a townhouse with his wife.  He developed pneumonia middle of January 2024 but he is recovering well from this now ROS:   14 system review of systems is positive for decreased hearing, gait difficulty, bruising, decreased stamina, weakness, shortness of breath with exertion all other systems negative  PMH:  Past Medical History:  Diagnosis Date   Atrial fibrillation ([place])    CVA (cerebral  vascular accident) ([place])    Hard of hearing     Social History:  Social History   Socioeconomic History   Marital status: Married    Spouse name: Not on file   Number of children: 4   Years of education: college   Highest education level: Master's degree (e.g., MA, MS, MEng, MEd, MSW, MBA)  Occupational History   Occupation: Retired Theme park manager  Tobacco Use   Smoking status: Former    Packs/day: 1.00    Years: 4.00     Total pack years: 4.00    Types: Cigarettes    Quit date: 1956    Years since quitting: 68.2   Smokeless tobacco: Never  Substance and Sexual Activity   Alcohol use: Not Currently   Drug use: Never   Sexual activity: Not on file  Other Topics Concern   Not on file  Social History Narrative   Lives at home with his wife.   Right-handed.   Caffeine use: 3 cups per day (coffee - 1/2 caff)   Social Determinants of Health   Financial Resource Strain: Not on file  Food Insecurity: Not on file  Transportation Needs: Not on file  Physical Activity: Not on file  Stress: Not on file  Social Connections: Not on file  Intimate Partner Violence: Not on file    Medications:   Current Outpatient Medications on File Prior to Visit  Medication Sig Dispense Refill   acetaminophen (TYLENOL) 500 MG tablet Take 500 mg by mouth at bedtime.     cholecalciferol (VITAMIN D3) 25 MCG (1000 UNIT) tablet Take 1,000 Units by mouth daily.     Cyanocobalamin (B-12) 500 MCG TABS Take by mouth. Once daily     cycloSPORINE (RESTASIS) 0.05 % ophthalmic emulsion Place 1 drop into both eyes 2 (two) times daily.     ELIQUIS 2.5 MG TABS tablet TAKE 1 TABLET(2.5 MG) BY MOUTH TWICE DAILY 60 tablet 3   fluocinonide cream (LIDEX) [id number] % Apply 1 application. topically 2 (two) times daily as needed (rash).     fluticasone (FLONASE) 50 MCG/ACT nasal spray Place 1 spray into both nostrils daily.     latanoprost (XALATAN) 0.005 % ophthalmic solution Place 1 drop into both eyes at bedtime.     levocetirizine (XYZAL) 5 MG tablet Take 5 mg by mouth daily as needed for allergies.     levothyroxine (SYNTHROID) 88 MCG tablet Take 88 mcg by mouth daily before breakfast.     mupirocin ointment (BACTROBAN) 2 % Apply 1 application. topically 2 (two) times daily as needed (rash).     omeprazole (PRILOSEC) 40 MG capsule Take 40 mg by mouth daily.     polyethylene glycol powder (MIRALAX) 17 GM/SCOOP powder Take 17 g by mouth daily.      Polyvinyl Alcohol-Povidone (REFRESH OP) Place 1 drop into both eyes in the morning and at bedtime.     rosuvastatin (CRESTOR) 20 MG tablet Take 1 tablet (20 mg total) by mouth daily. 30 tablet 1   sacubitril-valsartan (ENTRESTO) 24-26 MG Take 1 tablet by mouth at bedtime.     Sodium Chloride, Hypertonic, (MURO 128 OP) Apply 1 drop to eye 2 (two) times daily.     No current facility-administered medications on file prior to visit.    Allergies:  No Known Allergies  Physical Exam General: Frail elderly Caucasian male, seated, in no evident distress Head: head normocephalic and atraumatic.  Neck: supple with no carotid or supraclavicular bruits Cardiovascular: regular rate and rhythm,  no murmurs Musculoskeletal: no deformity Skin:  no rash/petichiae Vascular:  Normal pulses all extremities Vitals:   06/26/22 1319  BP: 106/68  Pulse: 83   Neurologic Exam Mental Status: Awake and fully alert. Oriented to place and time. Recent and remote memory intact. Attention span, concentration and fund of knowledge appropriate. Mood and affect appropriate.  Cranial Nerves: Fundoscopic exam not done Pupils equal, briskly reactive to light. Extraocular movements full without nystagmus. Visual fields full to confrontation. Hearing  greatly diminished bilaterally.  Facial sensation intact. Face, tongue, palate moves normally and symmetrically.  Motor: Normal bulk and tone. Normal strength in all tested extremity muscles.  Diminished fine finger movements on the left.  Orbits right over left upper extremity.  Flaccid left leg a little bit while walking. Sensory.: intact to touch ,pinprick .position and vibratory sensation.  Coordination: Rapid alternating movements normal in all extremities. Finger-to-nose and heel-to-shin performed accurately bilaterally. Gait and Station: Arises from chair without difficulty. Stance is normal. Gait demonstrates normal stride length and balance . Able to heel, toe and  tandem walk with moderate difficulty.  Reflexes: 1+ and symmetric. Toes downgoing.   NIHSS  0 Modified Rankin  1   ASSESSMENT: 89 year old Caucasian male with right MCA infarct secondary to right M2 occlusion s/p mechanical thrombectomy secondary to embolism in the setting of atrial fibrillation not on anticoagulation s/p Watchman procedure with a leak.  Vascular risk factors of hypertension and atrial fibrillation and age     PLAN: I had a long d/w patient and his wife about his recent stroke, chronic atrial fibrillation, watchman device and leakage,risk for recurrent stroke/TIAs, personally independently reviewed imaging studies and stroke evaluation results and answered questions.Continue Eliquis 2.5 mg twice daily for secondary stroke prevention and maintain strict control of hypertension with blood pressure goal below 130/90, diabetes with hemoglobin A1c goal below 6.5% and lipids with LDL cholesterol goal below 70 mg/dL. I also advised the patient to eat a healthy diet with plenty of whole grains, cereals, fruits and vegetables, exercise regularly and maintain ideal body weight Followup in the future with me only as needed. Greater than 50% of time during this 35 minute visit was spent on counseling,explanation of diagnosis embolic stroke, atrial fibrillation, risk benefit of anticoagulation with Eliquis, planning of further management, discussion with patient and family and coordination of care Antony Contras, MD Note: This document was prepared with digital dictation and possible smart phrase technology. Any transcriptional errors that result from this process are unintentional

## 2022-06-26 NOTE — Patient Instructions (Signed)
I had a long d/w patient and his wife about his recent stroke, chronic atrial fibrillation, watchman device and leakage,risk for recurrent stroke/TIAs, personally independently reviewed imaging studies and stroke evaluation results and answered questions.Continue Eliquis 2.5 mg twice daily for secondary stroke prevention and maintain strict control of hypertension with blood pressure goal below 130/90, diabetes with hemoglobin A1c goal below 6.5% and lipids with LDL cholesterol goal below 70 mg/dL. I also advised the patient to eat a healthy diet with plenty of whole grains, cereals, fruits and vegetables, exercise regularly and maintain ideal body weight Followup in the future with me only as needed.

## 2022-08-28 ENCOUNTER — Other Ambulatory Visit: Payer: Self-pay | Admitting: Neurology

## 2022-08-28 NOTE — Telephone Encounter (Signed)
Shouldn't patient be on eliquis 5mg  BID as weight greater than 60kg and kidney function less than the 1.5 threshold of scr? I am routing to provider to review.  Prescription refill request for Eliquis received. Indication:AFIB Last office visit:06/26/22 dR. SETHI Scr:1.05 Age: Weight:74.3KG

## 2023-02-07 ENCOUNTER — Encounter: Payer: Self-pay | Admitting: Neurology

## 2023-02-07 ENCOUNTER — Telehealth: Payer: Self-pay | Admitting: Neurology

## 2023-02-07 NOTE — Telephone Encounter (Signed)
Pt had a stroke 08/2021. Would be ok to hold eliquis 5 days prior to the procedure. Provided email for the dentist to attempt to send the form that needs to be completed to me.

## 2023-02-07 NOTE — Telephone Encounter (Signed)
[facility]) our fax machine is not working. Want to know if can email medical clearance form blood thinner for extraction to you. Would like a call back.

## 2023-03-01 ENCOUNTER — Other Ambulatory Visit: Payer: Self-pay | Admitting: Neurology

## 2023-03-06 NOTE — Telephone Encounter (Signed)
Rx refilled per last office visit note.

## 2023-03-06 NOTE — Telephone Encounter (Signed)
Pt's wife called wanting to know when this will be called in for her husband due to him being almost completely out. Please advise.

## 2023-05-22 IMAGING — MR MR HEAD W/O CM
12 of 13 series · 44 of 48 positions shown · non-contrast
Comparison: None Available.

CLINICAL DATA: New onset left-sided weakness, left facial droop,
dysarthria, and neglect

EXAM:
MRI HEAD WITHOUT CONTRAST
TECHNIQUE: Multiplanar, multiecho pulse sequences of the brain and surrounding
structures were obtained without intravenous contrast.

[Series 5: DWI · axial · 3.0mm · 0.96mm/px · z∈[-42,+128]mm · 8 of 115 slices shown (1 of 4)]
[im 1/115]
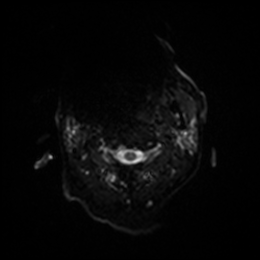
[im 17/115]
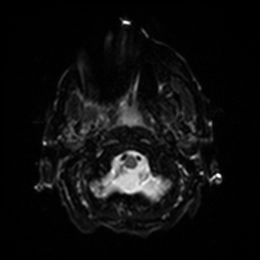
[im 33/115]
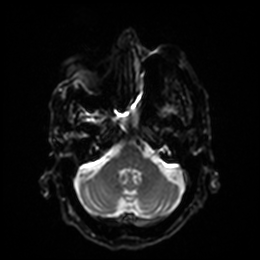
[im 49/115]
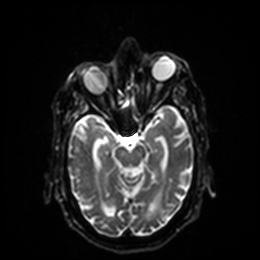
[im 66/115]
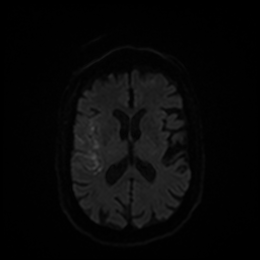
[im 82/115]
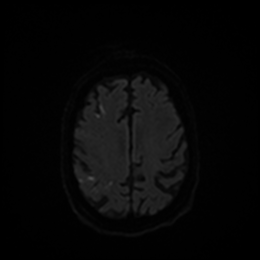
[im 98/115]
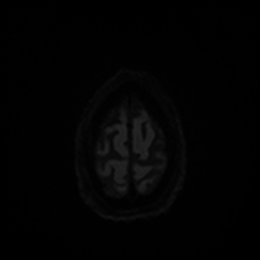
[im 115/115]
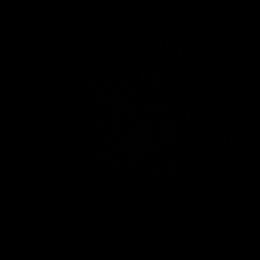

[Series 6: DWI · axial · 3.0mm · 0.96mm/px · z∈[-42,+125]mm · 4 of 53 slices shown (2 of 4)]
[im 1/53]
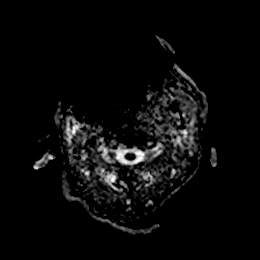
[im 18/53]
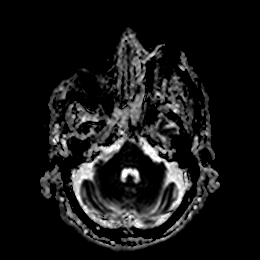
[im 35/53]
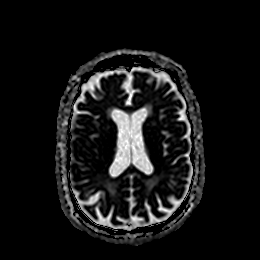
[im 53/53]
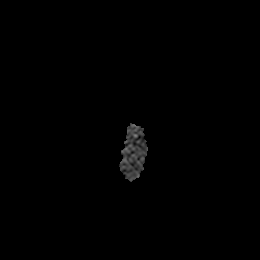

[Series 7: DWI · coronal · 4.0mm · 0.88mm/px · 6 of 84 slices shown (3 of 4)]
[im 1/84]
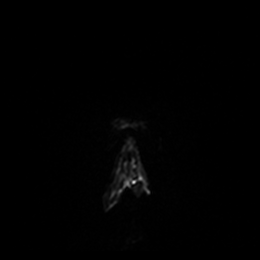
[im 17/84]
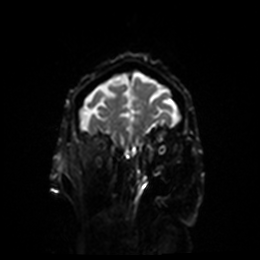
[im 34/84]
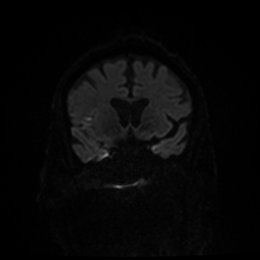
[im 50/84]
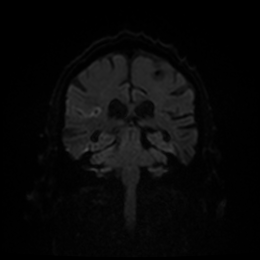
[im 67/84]
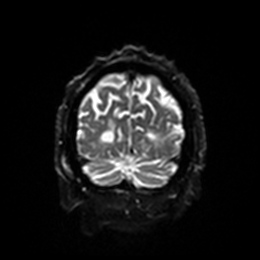
[im 84/84]
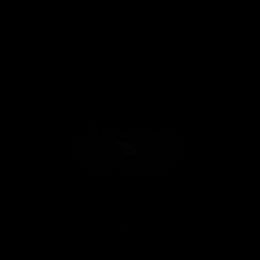

[Series 8: DWI · coronal · 4.0mm · 0.88mm/px · 3 of 42 slices shown (4 of 4)]
[im 1/42]
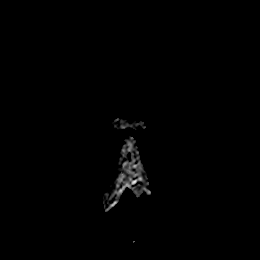
[im 21/42]
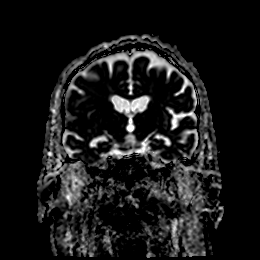
[im 42/42]
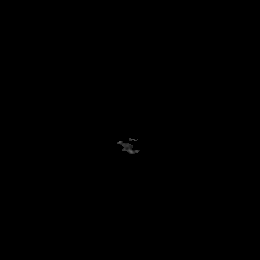

[Series 9: T1 · sagittal · 5.0mm · 0.75mm/px · 2 of 28 slices shown]
[im 1/28]
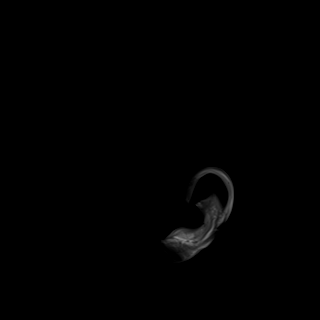
[im 28/28]
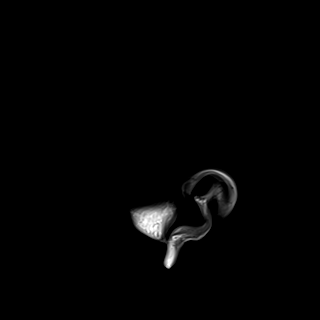

[Series 10: FLAIR · axial · 5.0mm · 0.49mm/px · z∈[-42,+125]mm · 2 of 29 slices shown]
[im 1/29]
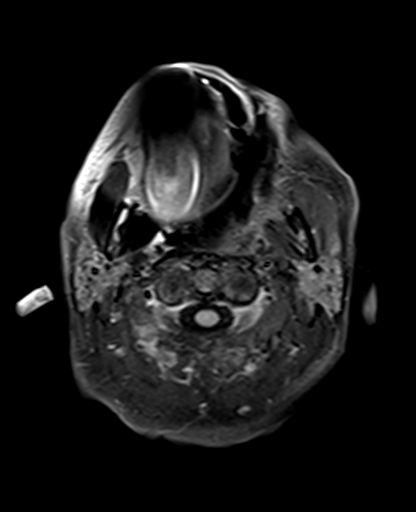
[im 29/29]
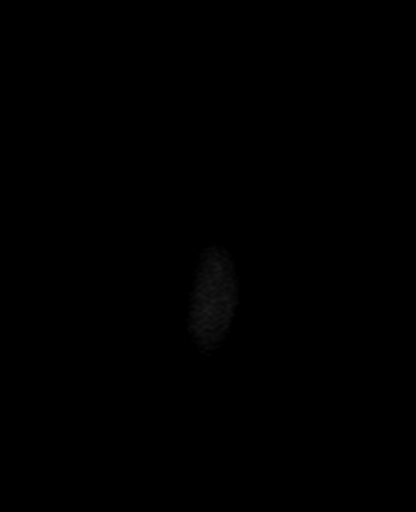

[Series 11: T2 · axial · 5.0mm · 0.78mm/px · z∈[-41,+126]mm · 2 of 29 slices shown (1 of 2)]
[im 1/29]
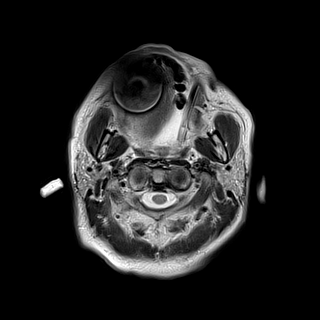
[im 29/29]
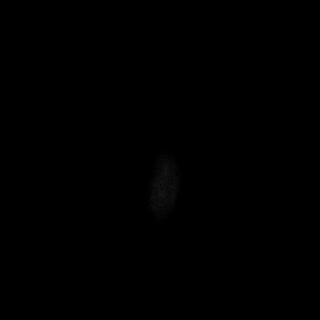

[Series 12: mag_images · axial · 3.0mm · 0.98mm/px · z∈[-42,+123]mm · 4 of 56 slices shown]
[im 1/56]
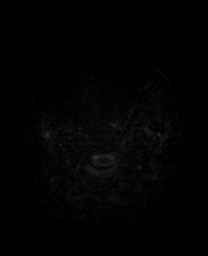
[im 19/56]
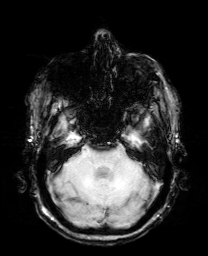
[im 37/56]
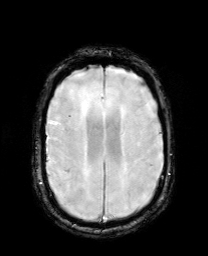
[im 56/56]
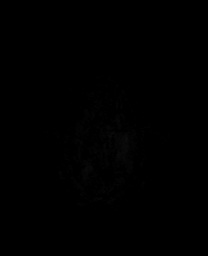

[Series 13: pha_images · axial · 3.0mm · 0.98mm/px · z∈[-42,+123]mm · 4 of 56 slices shown]
[im 1/56]
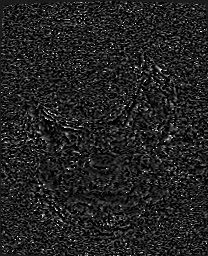
[im 19/56]
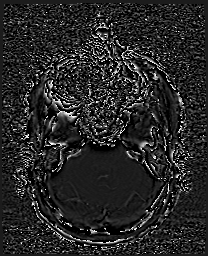
[im 37/56]
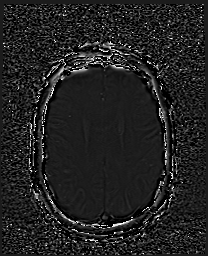
[im 56/56]
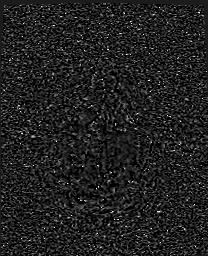

[Series 14: swi_images · axial · 3.0mm · 0.98mm/px · z∈[-42,+123]mm · 4 of 56 slices shown]
[im 1/56]
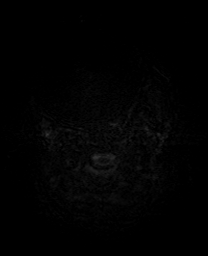
[im 19/56]
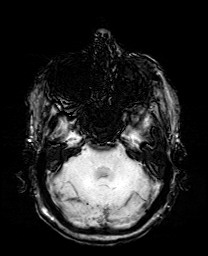
[im 37/56]
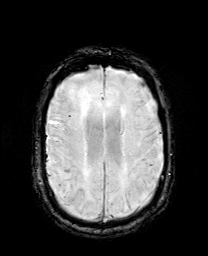
[im 56/56]
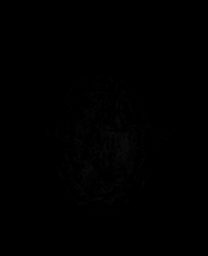

[Series 15: mip_images(sw) · axial · 24.0mm · 0.98mm/px · z∈[-31,+112]mm · 3 of 49 slices shown]
[im 1/49]
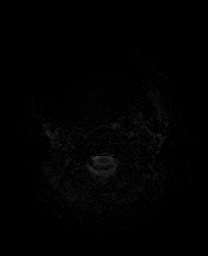
[im 25/49]
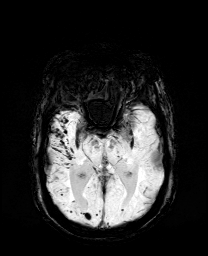
[im 49/49]
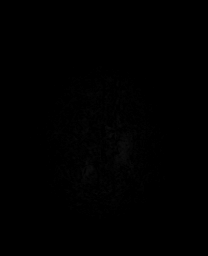

[Series 17: T2 · coronal · 5.0mm · 0.34mm/px · 2 of 33 slices shown (2 of 2)]
[im 1/33]
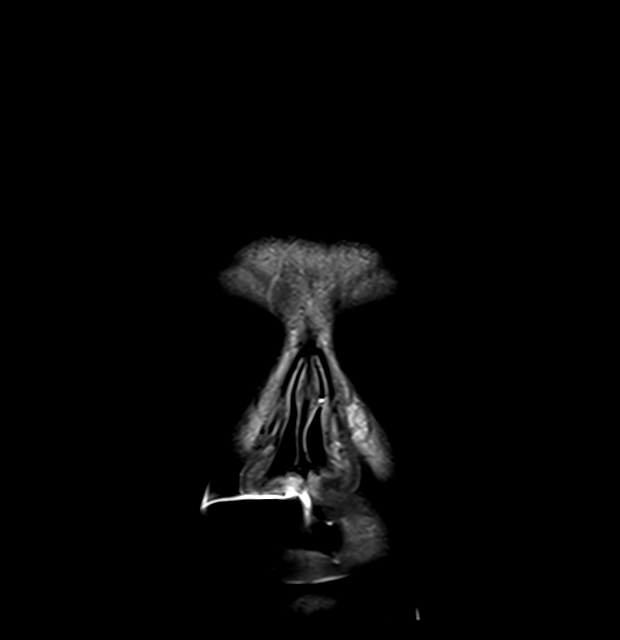
[im 33/33]
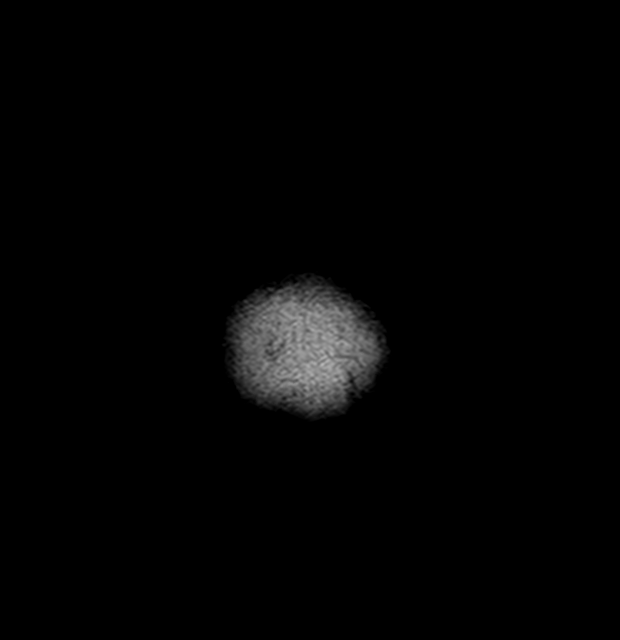

[44 of 48 positions shown; findings below may reference images not displayed]

FINDINGS: Brain: Restricted diffusion with ADC correlates in the right MCA
distribution, most prominently involving the insula insula and right
temporal lobe (series 5, image 89-91), with smaller areas of
restricted diffusion in the right frontal (series 7, image 71) and
right parietal lobes (series 5, images 97-100 and series 7, images
57-58).

Petechial hemorrhage is associated with the right insular, right
temporal, and right parietal areas of infarction, with additional
subarachnoid hemorrhage along the right temporal and frontoparietal
lobes.

No mass, mass effect, or midline shift. Trace susceptibility
artifact is seen layering in the occipital horns of the lateral
ventricles (series 14, image 25), likely hemorrhage, which is
technically age indeterminate. No hydrocephalus. Scattered punctate
foci of susceptibility in the bilateral cerebral hemispheres, likely
represent sequela of prior microhemorrhages, with larger foci of
hemosiderin deposition in the left parietal and right occipital
lobes likely the sequela of small hemorrhagic infarcts.

Vascular: Normal arterial flow voids.

Skull and upper cervical spine: Normal marrow signal.

Sinuses/Orbits: Mucosal thickening in the right maxillary sinus and
ethmoid air cells. Status post bilateral lens replacements.

Other: The mastoids are well aerated.
IMPRESSION: 1. Acute infarcts in the right insula and right temporal lobe, with
smaller areas of infarction in the right frontal and parietal
cortex, consistent with the prior right MCA occlusion. The areas of
infarction in the right insula, right temporal lobe and right
parietal lobe demonstrate petechial hemorrhage. No mass effect or
midline shift.
2. Subarachnoid hemorrhage along the right temporal and
frontoparietal regions.
3. Trace susceptibility artifact in the occipital horns of the
lateral ventricles, likely hemorrhage, but of unknown acuity. No
hydrocephalus.

These results will be called to the ordering clinician or
representative by the Radiologist Assistant, and communication
documented in the PACS or [REDACTED].

## 2023-05-23 IMAGING — DX DG ABD PORTABLE 1V
1 series · 1 of 1 positions shown · non-contrast
Comparison: None Available.

CLINICAL DATA: Enteric tube placement

EXAM:
PORTABLE ABDOMEN - 1 VIEW

[abdomen]
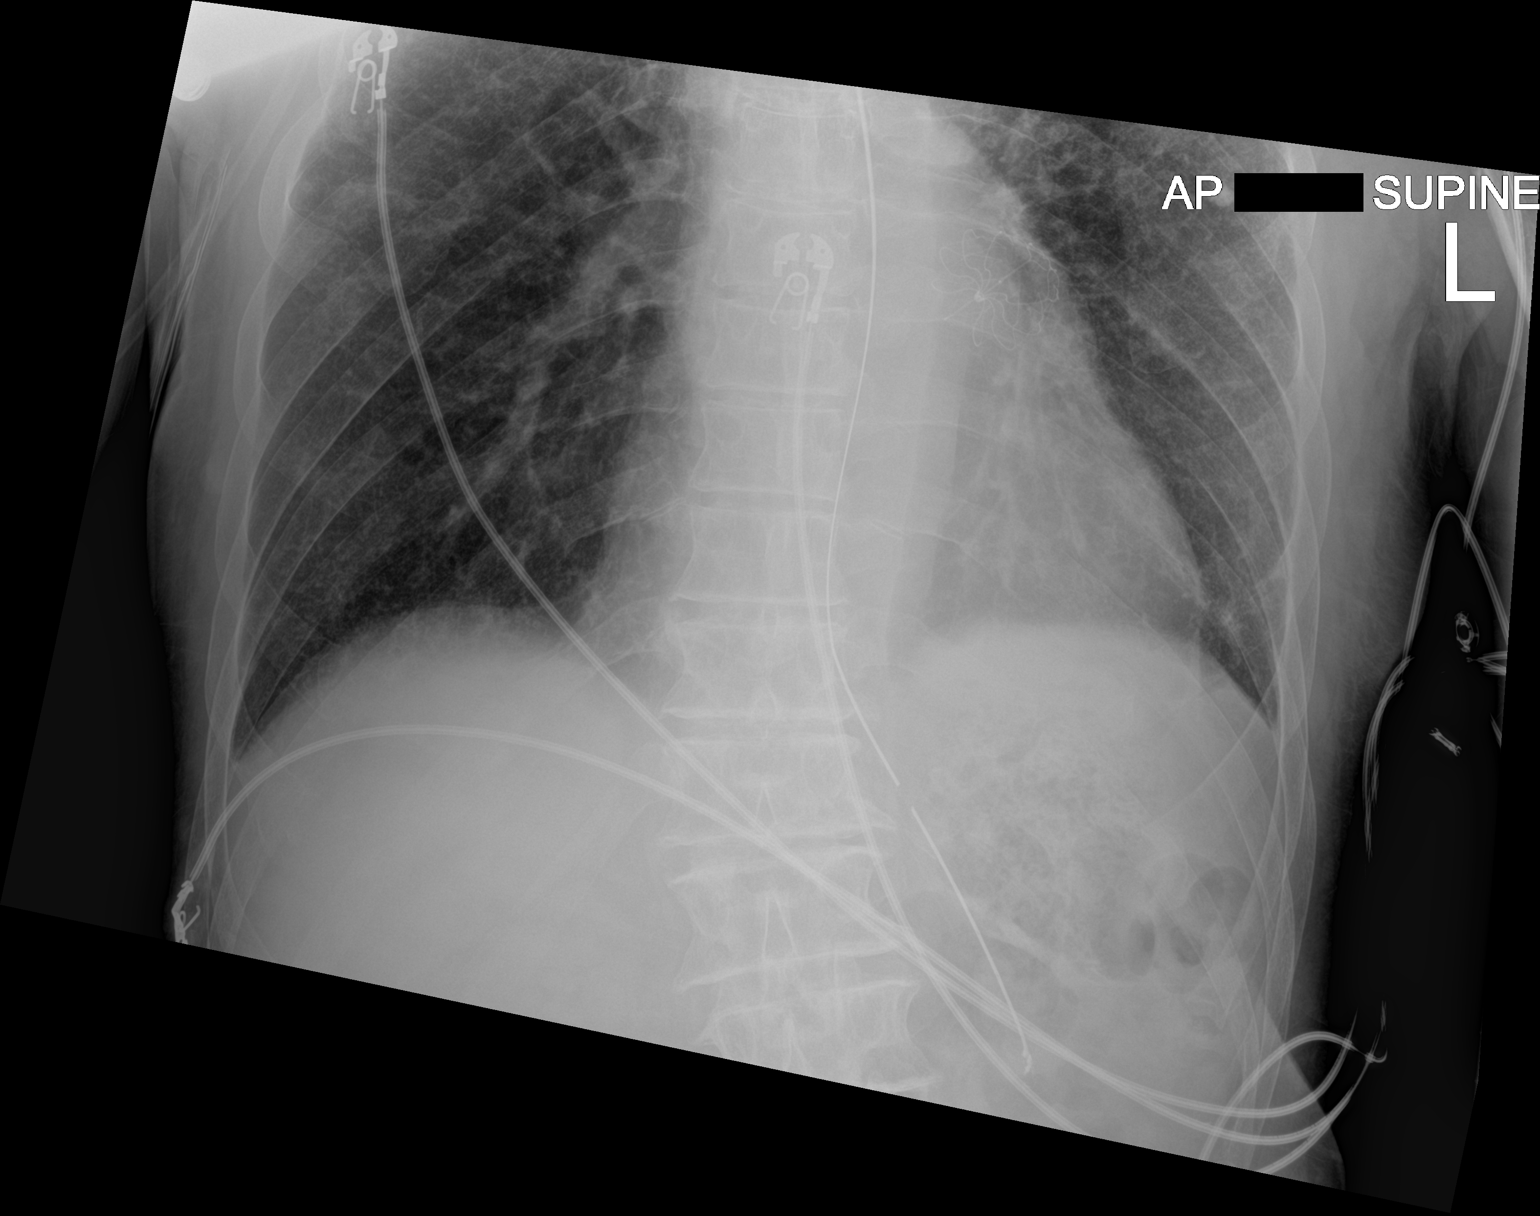

[1 of 1 positions shown; findings below may reference images not displayed]

FINDINGS: Tip of enteric tube is seen in the stomach. Watchman device is noted
in the region of left atrial appendage. Lower abdomen and pelvis are
not included in the image.
IMPRESSION: Tip of enteric tube is seen in the stomach.

## 2023-05-24 IMAGING — CT CT HEAD W/O CM
4 series · 16 of 47 positions shown, 18 images · non-contrast
Comparison: Brain MRI from yesterday. Flat table CT from 2 days
prior

CLINICAL DATA: Stroke follow-up



[Series 3: head wo · axial · 0.45mm/px · z∈[-96,+14]mm · 7 of 30 slices shown, 9 images]
[im 4/30  brain]
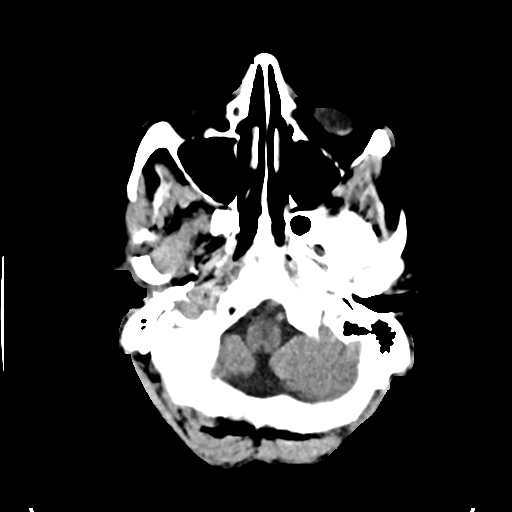
[im 4/30  bone]
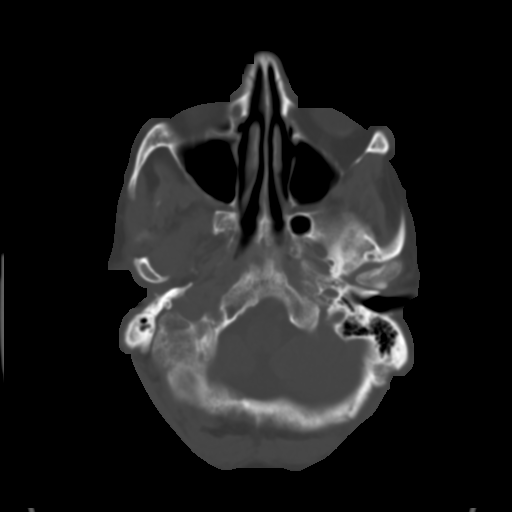
[im 8/30  brain]
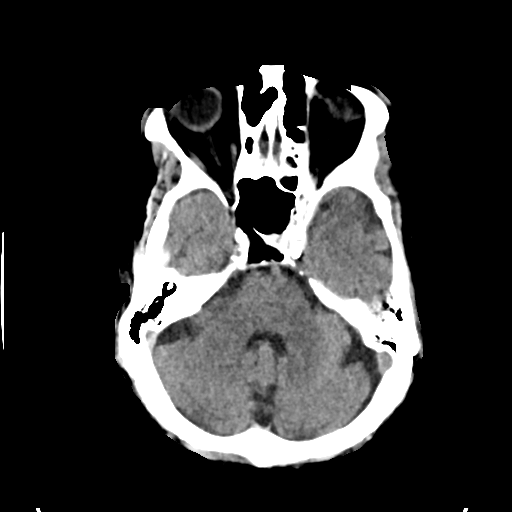
[im 11/30  brain]
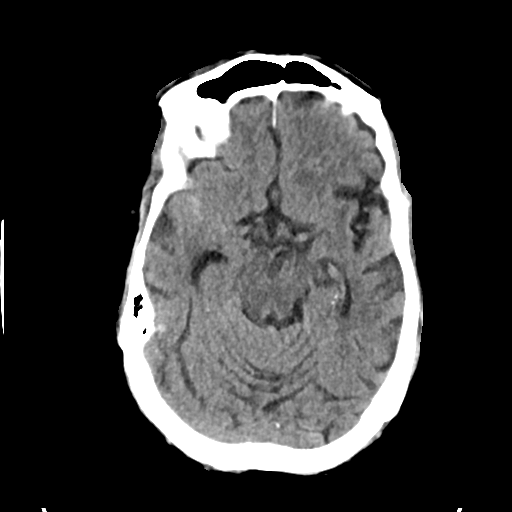
[im 15/30  brain]
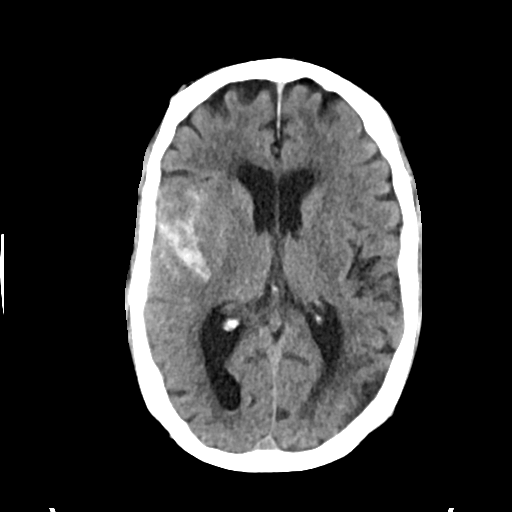
[im 19/30  brain]
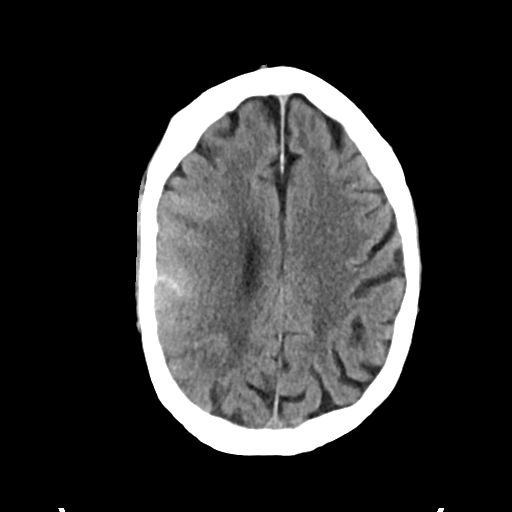
[im 19/30  bone]
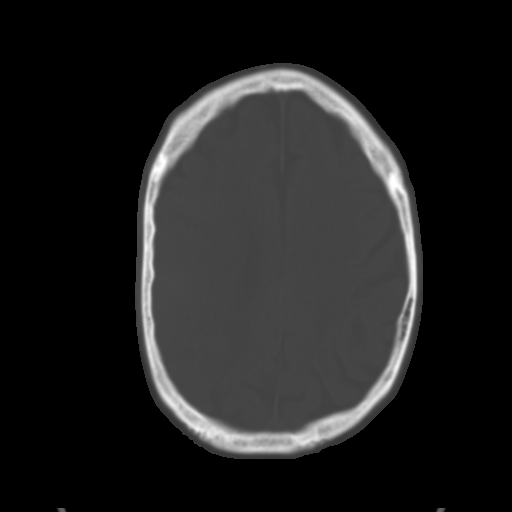
[im 22/30  brain]
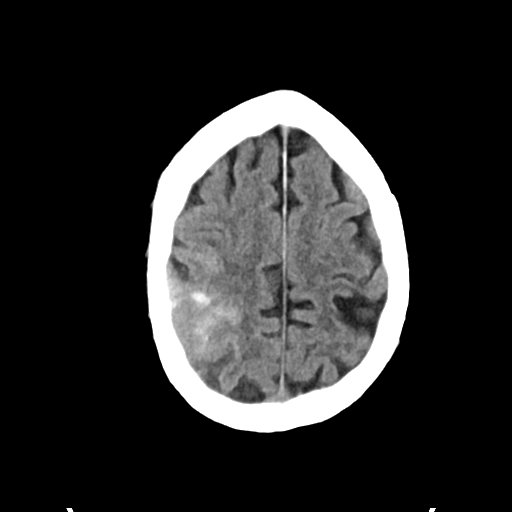
[im 26/30  brain]
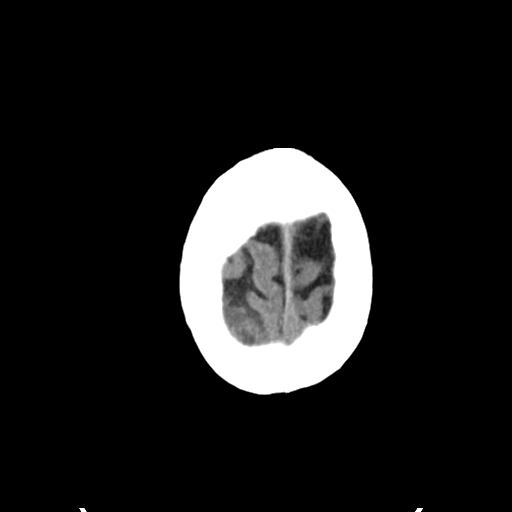

[Series 4: head bone · axial · 0.45mm/px · z∈[-98,-68]mm · 3 of 75 slices shown]
[im 8/75  bone]
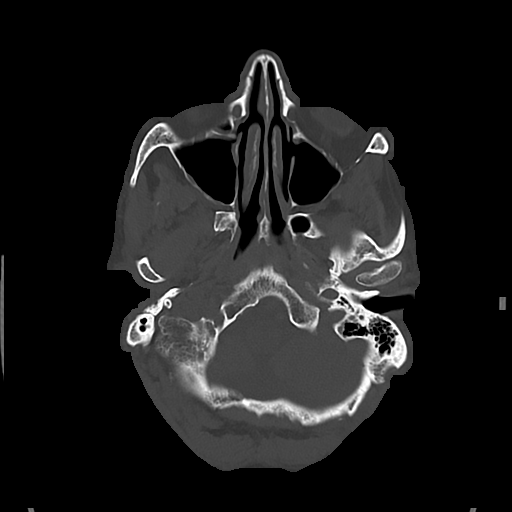
[im 15/75  bone]
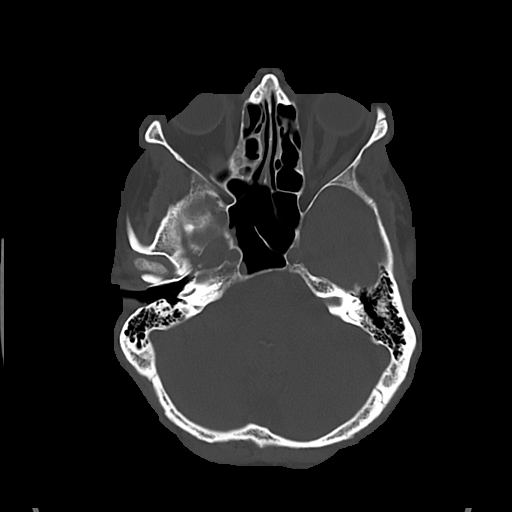
[im 23/75  bone]
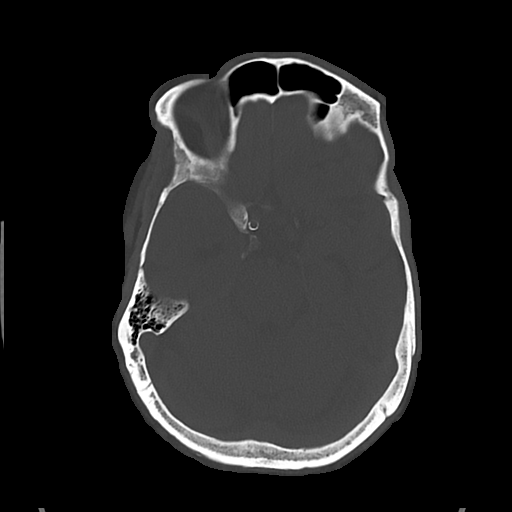

[Series 5: cor soft · coronal · 0.29mm/px · 3 of 67 slices shown]
[im 23/67  brain]
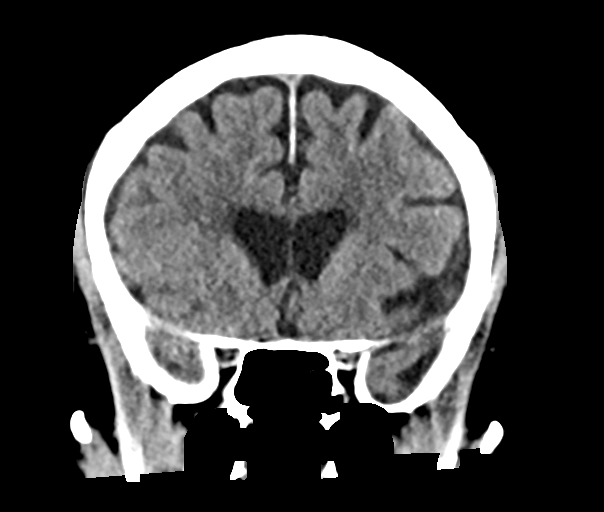
[im 30/67  brain]
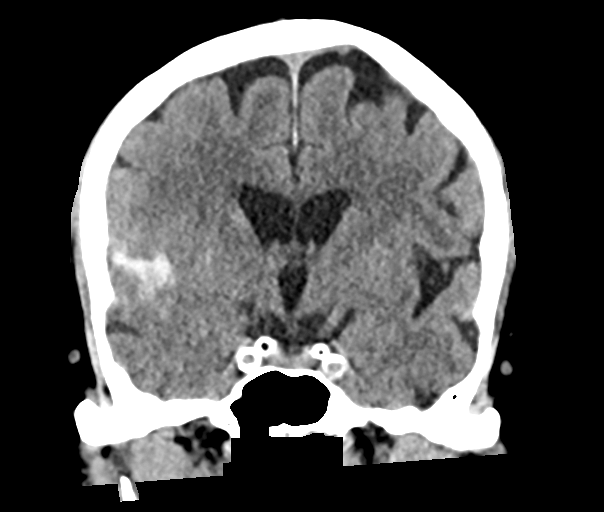
[im 37/67  brain]
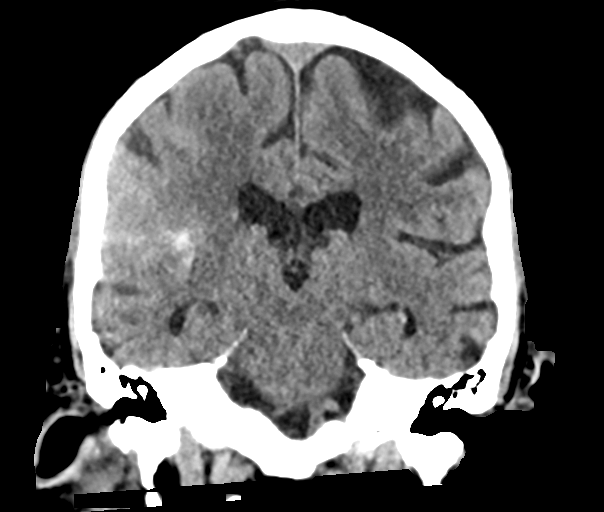

[Series 6: sag soft · sagittal · 0.29mm/px · 3 of 52 slices shown]
[im 18/52  brain]
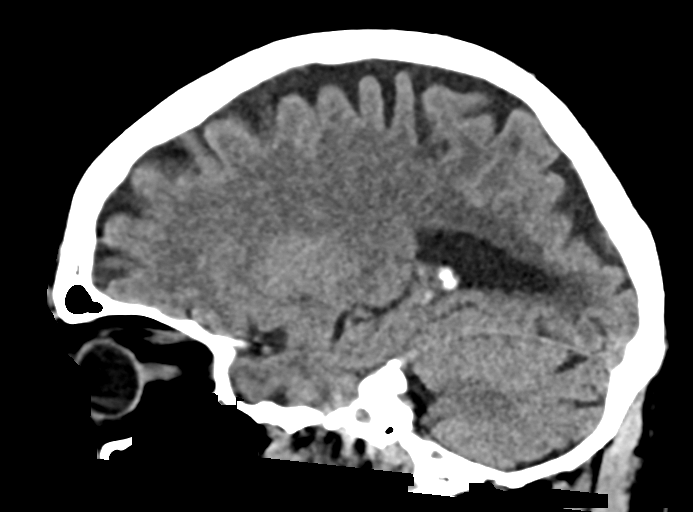
[im 26/52  brain]
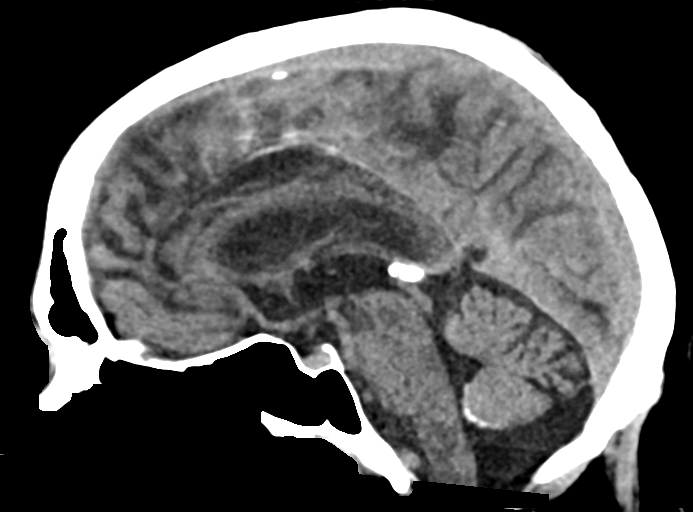
[im 35/52  brain]
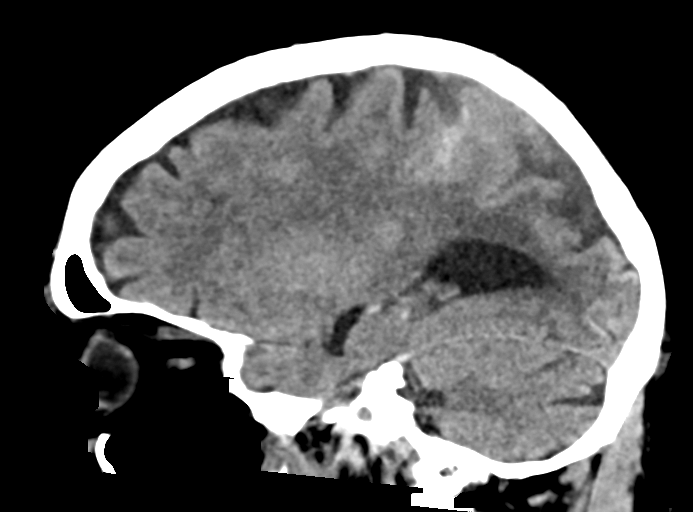

[16 of 47 positions shown; findings below may reference images not displayed]

FINDINGS: Brain: Subarachnoid hemorrhage along the right sylvian fissure and
adjacent sulci. Maximal density material (extravasated contrast) has
diffused and remaining high density is attributed to blood products
which appear non progressed. Mineralization at the right occipital
cortex. No parenchymal hematoma. Right cerebral swelling better seen
by prior MRI. No hydrocephalus or midline shift. No masslike finding

Vascular: No hyperdense vessel or unexpected calcification.

Skull: Normal. Negative for fracture or focal lesion.

Sinuses/Orbits: No acute finding.
IMPRESSION: No progression of subarachnoid hemorrhage along the right cerebral
convexity which partially obscures known right MCA territory
infarcts.

## 2023-07-26 NOTE — Telephone Encounter (Signed)
 Noted, hold for 3 days.   Form signed and faxed back, confirmation received.

## 2023-07-26 NOTE — Telephone Encounter (Signed)
 Clearance form received for extractions. Pt had a stroke 08/2021. Would be ok to hold eliquis 5 days prior to the procedure with a low but acceptable risk for reoccurring stroke. Form given to MD for review and signature.

## 2023-08-31 ENCOUNTER — Other Ambulatory Visit: Payer: Self-pay | Admitting: Neurology

## 2023-09-03 NOTE — Telephone Encounter (Signed)
 Prescription refill request for Eliquis  received & refilled.  Indication:AFIB Last office visit: 06/26/22  Scr:0.9 (05/25/23) Age: 89 yr old male  Weight:74.3kg

## 2023-12-13 ENCOUNTER — Other Ambulatory Visit: Payer: Self-pay

## 2023-12-13 ENCOUNTER — Emergency Department (HOSPITAL_BASED_OUTPATIENT_CLINIC_OR_DEPARTMENT_OTHER): Admitting: Radiology

## 2023-12-13 ENCOUNTER — Emergency Department (HOSPITAL_BASED_OUTPATIENT_CLINIC_OR_DEPARTMENT_OTHER)

## 2023-12-13 ENCOUNTER — Encounter (HOSPITAL_BASED_OUTPATIENT_CLINIC_OR_DEPARTMENT_OTHER): Payer: Self-pay | Admitting: Emergency Medicine

## 2023-12-13 ENCOUNTER — Emergency Department (HOSPITAL_BASED_OUTPATIENT_CLINIC_OR_DEPARTMENT_OTHER)
Admission: EM | Admit: 2023-12-13 | Discharge: 2023-12-13 | Disposition: A | Discharge status: Home / Self Care | Attending: Emergency Medicine | Admitting: Emergency Medicine

## 2023-12-13 DIAGNOSIS — W01198A Fall on same level from slipping, tripping and stumbling with subsequent striking against other object, initial encounter: Secondary | ICD-10-CM | POA: Diagnosis not present

## 2023-12-13 DIAGNOSIS — S0990XA Unspecified injury of head, initial encounter: Secondary | ICD-10-CM | POA: Insufficient documentation

## 2023-12-13 DIAGNOSIS — S59902A Unspecified injury of left elbow, initial encounter: Secondary | ICD-10-CM | POA: Diagnosis present

## 2023-12-13 DIAGNOSIS — Z79899 Other long term (current) drug therapy: Secondary | ICD-10-CM | POA: Insufficient documentation

## 2023-12-13 DIAGNOSIS — Z7901 Long term (current) use of anticoagulants: Secondary | ICD-10-CM | POA: Diagnosis not present

## 2023-12-13 DIAGNOSIS — S51012A Laceration without foreign body of left elbow, initial encounter: Secondary | ICD-10-CM | POA: Diagnosis not present

## 2023-12-13 DIAGNOSIS — I4891 Unspecified atrial fibrillation: Secondary | ICD-10-CM | POA: Diagnosis not present

## 2023-12-13 DIAGNOSIS — Z87891 Personal history of nicotine dependence: Secondary | ICD-10-CM | POA: Diagnosis not present

## 2023-12-13 DIAGNOSIS — W19XXXA Unspecified fall, initial encounter: Secondary | ICD-10-CM

## 2023-12-13 NOTE — ED Triage Notes (Signed)
 Pt c/o fall this morning and hit back of head. Small laceration on L elbow. Pt on Eliquis . Denies LOC.

## 2023-12-13 NOTE — Discharge Instructions (Signed)
 Keep the dressing on the left elbow for the next 2 days.  Then you can remove wash with soap and water if the dressing is a little stuck just wet it with water to help get it off.  Then apply antibiotic ointment and redress.  Should heal on its own.  Head CT and x-ray of left elbow without any acute findings.

## 2023-12-13 NOTE — ED Notes (Signed)

## 2023-12-13 NOTE — ED Provider Notes (Addendum)
 [place] EMERGENCY DEPARTMENT AT [facility] Provider Note   CSN: [id number] Arrival date & time: 12/13/23  1324     Patient presents with: Elijah Stone is a 89 y.o. male.   Patient with fall this morning at around 11:00.  Elijah Stone outside.  No loss of consciousness did strike his head just gently.  He is on the blood thinner Eliquis .  Is followed by cardiology for known congestive heart failure.  Patient got a skin tear superficial laceration to left elbow area with the fall.  No other injuries.  No neck pain no back pain able to ambulate without any difficulty since the fall..  Past medical history of atrial fibrillation cerebrovascular accident hard of hearing.  Patient is a former smoker quit in 1956.  Patient states that his tetanus is up-to-date.       Prior to Admission medications   Medication Sig Start Date End Date Taking? Authorizing Provider  acetaminophen  (TYLENOL ) 500 MG tablet Take 500 mg by mouth at bedtime.    [provider]  cholecalciferol (VITAMIN D3) 25 MCG (1000 UNIT) tablet Take 1,000 Units by mouth daily.    [provider]  Cyanocobalamin (B-12) 500 MCG TABS Take by mouth. Once daily    [provider]  cycloSPORINE  (RESTASIS ) 0.05 % ophthalmic emulsion Place 1 drop into both eyes 2 (two) times daily.    [provider]  ELIQUIS  2.5 MG TABS tablet TAKE 1 TABLET(2.5 MG) BY MOUTH TWICE DAILY 09/03/23   Sethi, Pramod S, MD  fluocinonide cream (LIDEX) 0.05 % Apply 1 application. topically 2 (two) times daily as needed (rash).    [provider]  fluticasone (FLONASE) 50 MCG/ACT nasal spray Place 1 spray into both nostrils daily.    [provider]  latanoprost  (XALATAN ) 0.005 % ophthalmic solution Place 1 drop into both eyes at bedtime.    [provider]  levocetirizine (XYZAL) 5 MG tablet Take 5 mg by mouth daily as needed for allergies.    [provider]  levothyroxine   (SYNTHROID ) 88 MCG tablet Take 88 mcg by mouth daily before breakfast.    [provider]  mupirocin ointment (BACTROBAN) 2 % Apply 1 application. topically 2 (two) times daily as needed (rash).    [provider]  omeprazole (PRILOSEC) 40 MG capsule Take 40 mg by mouth daily.    [provider]  polyethylene glycol powder (MIRALAX ) 17 GM/SCOOP powder Take 17 g by mouth daily.    [provider]  Polyvinyl Alcohol -Povidone (REFRESH OP) Place 1 drop into both eyes in the morning and at bedtime.    [provider]  rosuvastatin  (CRESTOR ) 20 MG tablet Take 1 tablet (20 mg total) by mouth daily. 09/16/21   Remi Pippin, NP  sacubitril-valsartan (ENTRESTO) 24-26 MG Take 1 tablet by mouth at bedtime.    [provider]  Sodium Chloride , Hypertonic, (MURO 128 OP) Apply 1 drop to eye 2 (two) times daily.    [provider]    Allergies: Patient has no known allergies.    Review of Systems  Constitutional:  Negative for chills and fever.  HENT:  Negative for ear pain and sore throat.   Eyes:  Negative for pain and visual disturbance.  Respiratory:  Negative for cough and shortness of breath.   Cardiovascular:  Negative for chest pain and palpitations.  Gastrointestinal:  Negative for abdominal pain and vomiting.  Genitourinary:  Negative for dysuria and hematuria.  Musculoskeletal:  Negative for arthralgias and back pain.  Skin:  Positive for wound. Negative for color change and rash.  Neurological:  Negative for seizures and syncope.  All other systems reviewed and are negative.   Updated Vital Signs BP (!) 110/96 (BP Location: Right Arm)   Pulse 89   Temp 98.1 F (36.7 C)   Resp 18   SpO2 100%   Physical Exam Vitals and nursing note reviewed.  Constitutional:      General: He is not in acute distress.    Appearance: He is well-developed.  HENT:     Head: Normocephalic and atraumatic.     Mouth/Throat:     Mouth: Mucous  membranes are moist.  Eyes:     Conjunctiva/sclera: Conjunctivae normal.  Cardiovascular:     Rate and Rhythm: Normal rate and regular rhythm.     Heart sounds: No murmur heard. Pulmonary:     Effort: Pulmonary effort is normal. No respiratory distress.     Breath sounds: Normal breath sounds.  Abdominal:     Palpations: Abdomen is soft.     Tenderness: There is no abdominal tenderness.  Musculoskeletal:        General: Signs of injury present. No swelling.     Cervical back: Normal range of motion and neck supple. No rigidity or tenderness.     Comments: Left elbow with a elliptical skin tear little bit through the dermis.  Bleeding controlled.  Some bruising to the elbow area.  Good range of motion of the elbow wrist and shoulder.  Radial pulses 1+.  Good cap refill to the fingers.  Sensation intact.  Skin:    General: Skin is warm and dry.     Capillary Refill: Capillary refill takes less than 2 seconds.  Neurological:     General: No focal deficit present.     Mental Status: He is alert and oriented to person, place, and time.     Cranial Nerves: No cranial nerve deficit.     Sensory: No sensory deficit.     Motor: No weakness.  Psychiatric:        Mood and Affect: Mood normal.     (all labs ordered are listed, but only abnormal results are displayed) Labs Reviewed - No data to display  EKG: None  Radiology: No results found.   Procedures   Medications Ordered in the ED - No data to display                                  Medical Decision Making Amount and/or Complexity of Data Reviewed Radiology: ordered.   Fall on the blood thinner Eliquis .  Needs head CT.  Already addressed the skin tear to the left elbow.  Will get an x-ray of that just to be sure but clinically it appears that there is no evidence of any fracture.  He will keep the dressing in place for 2 days.  And then redress it with antibiotic ointment.  CT head and x-ray of the left elbow without  any acute abnormalities.  Head CT does show evidence of chronic small vessel ischemia disease and known chronic infarcts.  But nothing acute.  Final diagnoses:  Fall, initial encounter  Injury of head, initial encounter  Skin tear of left elbow without complication, initial encounter    ED Discharge Orders     None          Konni Kesinger,  [place], MD 12/13/23 1412    Geraldene Glendia, MD 12/13/23 1544

## 2024-01-31 ENCOUNTER — Telehealth: Payer: Self-pay | Admitting: *Deleted

## 2024-01-31 NOTE — Telephone Encounter (Signed)
 Received medical clearance for patient for dental treatment . Spoke to wife (checked DPR) Per wife no stroke in last year . Wife aware Dr Rosemarie is at hospital this week and will sign  and fax to [facility] next week when back is in office Per wife no rush .

## 2024-02-04 NOTE — Telephone Encounter (Signed)
 Clearance faxed back to [facility]. Received a receipt of confirmation.

## 2024-02-27 NOTE — Progress Notes (Signed)
 OUTPATIENT  GASTROENTEROLOGY CONSULTATION  REFFERING PROVIDER  Dr. Aliene Nikki Rams, MD  REASON OF CONSULTATION  Elevated liver enzymes and pruritus   HISTORY OF PRESENT ILLNESS  I have the pleasure of seeing this pleasant 89 yo man as a consultation for elevated liver enzymes and pruritus.  Recent routine hepatic enzymes reveals elevated ALKP, TTG and mild elevation of AST.  TB is normal.  RUQ US  reveals no focal hepatic lesions nor biliary dilation.  Patient does not recall taking new medications particularly antibiotic.   Hepatic serology is negative for HAV, HBV and HCV.  AMA is normal.  His main complaint is generalized pruritus.  REVIEW OF SYSTEMS  See above and negative for fever, chills, night sweats, chest pain, SOB, DOE, rash, arthralgia, melena, hematochezia, unintentional weight loss, anorexia or constitutional symptoms.   MEDICATION & ALLERGY    ALLERGIES Allergies[1]   MEDICATIONS Current Medications[2]  PAST  HISTORY    PAST MEDICAL HISTORY Problem List[3]   PAST SURGICAL HISTORY Surgical History[4]  SOCIAL HISTORY   Tobacco Use History[5] Social History   Substance and Sexual Activity  Alcohol  Use Not Currently   Social History   Substance and Sexual Activity  Drug Use Never    FAMILY HISTORY   Colon cancer: no, Colon polyps: no, IBD: no, Celiac disease: no, Liver disease:no   PHYSICAL EXAM   Vitals:   02/27/24 1329  BP: 120/60  BP Location: Right arm  Patient Position: Sitting  Pulse: 72  SpO2: 96%  Weight: 74.9 kg (165 lb 3.2 oz)  Height: 1.829 m (6')   Body mass index is 22.41 kg/m.  Abdomen:  No organomegaly.  Non-tender  RESULT   LABS  Lab Results  Component Value Date   WBC 7.30 01/24/2024   HGB 11.8 (L) 01/24/2024   HCT 36.1 (L) 01/24/2024   MCV 96.4 (H) 01/24/2024   PLT 197 01/24/2024     Lab Results  Component Value Date   ALT 31 02/13/2024   AST 37 02/13/2024   GGT 151 (H) 02/13/2024   BILITOT 0.7  02/13/2024      IMAGING  I have personally reviewed the imaging result (s).   ASSESSMENT & PLAN   ASSESSMENT  Intrahepatic cholestasis.  Mild.  Conservative management is preferred Pruritus due to cholestasis  PLAN  1. Start cholestyramine 4 gm twice daily 2. Monitor hepatic enzymes.  Avoid additional hepatotoxic drugs particularly antibiotic 3. Follow up in 1-2 months    Thank you for allowing us  to participate in the care of this patient.   I have provided this patient with 45 minutes of clinical care - 15 minutes in chart review and 30 minutes of face-to-face interaction and medical decision making    Gunnar HILARIO Daniels,  MD  02/27/2024 1:56 PM   Tri Chris Daniels, MD         [1] Allergies Allergen Reactions  . Codeine Other (See Comments)  [2]  Current Outpatient Medications:  .  acetaminophen  (TYLENOL ) 325 mg tablet, Take 650 mg by mouth every 6 (six) hours as needed., Disp: , Rfl:  .  ammonium lactate (LAC-HYDRIN) 12 % lotion, Apply topically as needed for dry skin., Disp: 396 g, Rfl: 2 .  cholecalciferol 25 mcg (1,000 unit) cap, Take 1,000 Units by mouth Once Daily., Disp: , Rfl:  .  cholestyramine 4 g packet, Take 1 packet (4 g total) by mouth 2 (two) times a day. Take one packet twice daily (make sure other drugs are  taken either 2 hours prior to cholestyramine or 4 hours after cholestyramine), Disp: 60 packet, Rfl: 3 .  cyanocobalamin, vitamin B-12, 500 mcg subl, Take 2 tablets (1,000 mcg total) by mouth daily., Disp: , Rfl:  .  cycloSPORINE  (RESTASIS ) 0.05 % ophthalmic emulsion, Administer 1 drop into both eyes 2 (two) times a day., Disp: 60 each, Rfl: 3 .  Eliquis  2.5 mg tab, Take 2.5 mg by mouth 2 (two) times a day., Disp: , Rfl:  .  fluocinonide (LIDEX) 0.05 % cream, Apply 1 Application topically 2 (two) times a day as needed., Disp: , Rfl:  .  fluticasone propionate (FLONASE) 50 mcg/spray nasal spray, Administer 2 sprays into each nostril Once Daily for 14 days.,  Disp: 1 each, Rfl: 0 .  hydrOXYzine (ATARAX) 25 mg tablet, Take 1 tablet (25 mg total) by mouth at bedtime., Disp: 90 tablet, Rfl: 0 .  latanoprost  (XALATAN ) 0.005 % ophthalmic solution, Administer 1 drop into both eyes at bedtime., Disp: 7.5 mL, Rfl: 3 .  levothyroxine  (SYNTHROID ) 88 mcg tablet, TAKE 1 TABLET(88 MCG) BY MOUTH DAILY, Disp: 90 tablet, Rfl: 1 .  mupirocin (BACTROBAN) 2 % ointment, Apply  topically., Disp: , Rfl:  .  ophthalmic gel carboxymethylcellulose sodium 1 %, Administer 1 drop into each eyes 2 (two) times a day., Disp: , Rfl:  .  polyethylene glycol (GLYCOLAX ) 17 gram packet, Take 17 g by mouth Once Daily., Disp: , Rfl:  .  rosuvastatin  (CRESTOR ) 20 mg tablet, TAKE 1 TABLET(20 MG) BY MOUTH DAILY, Disp: 90 tablet, Rfl: 1 .  sodium bicarb-sodium chloride  (Neilmed Sinus Rinse Refill) pack, by sinus irrigation route Once Daily., Disp: , Rfl:  .  sodium chloride  (Muro 128) 2 % drop eye drops, Administer 1 drop into each eyes nightly., Disp: , Rfl:  .  triamcinolone 0.1% cream-eucerin cream 1:1, APPLY TOPICALLY DAILY AS DIRECTED, Disp: , Rfl:   Current Facility-Administered Medications:  .  cyanocobalamin (VITAMIN B12) injection 1,000 mcg, 1,000 mcg, intramuscular, Q30 Days, Aliene Nikki Rams, MD, 1,000 mcg at 02/22/24 1012 [3] Patient Active Problem List Diagnosis  . Paroxysmal atrial fibrillation (HCC)  . Acquired hypothyroidism  . OSA on CPAP  . Constipation  . Pancytopenia (HCC)  . CHF (congestive heart failure) (HCC)  . Allergic rhinitis  . BPH (benign prostatic hyperplasia)  . Hearing loss  . H/O type B viral hepatitis  . Acquired contracture of bladder neck  . Prediabetes  . Dermatochalasis of both upper eyelids  . Blepharitis of upper and lower eyelids of both eyes  . Posterior vitreous detachment of left eye  . B12 deficiency  . Chronic cough  . Bilateral sensorineural hearing loss  . Myelodysplastic syndrome (HCC)  . Abnormal chest CT  . SAH  (subarachnoid hemorrhage) (HCC)  . Restrictive lung disease  . Elevated PSA  . Prostate nodule  . Dysuria  . Primary open angle glaucoma (POAG) of both eyes, mild stage  . Dry eye syndrome of both lacrimal glands  . Corneal guttata of both eyes  [4] Past Surgical History: Procedure Laterality Date  . APPENDECTOMY     Procedure: APPENDECTOMY  . BACK SURGERY  2005   Procedure: BACK SURGERY; lumbar  . BLADDER SURGERY  2018   Procedure: BLADDER SURGERY; removal of scar tissue  . CATARACT EXTRACTION     Procedure: CATARACT EXTRACTION  . COLONOSCOPY  04/2007   Procedure: COLONOSCOPY  . KIDNEY STONE SURGERY     Procedure: KIDNEY STONE SURGERY  . NOSE SURGERY  Procedure: NOSE SURGERY  . PROSTATE SURGERY     Procedure: PROSTATE SURGERY; TURP 2014 2015 2016  . ROTATOR CUFF REPAIR Left 2011   Procedure: ROTATOR CUFF REPAIR  . TONSILECTOMY, ADENOIDECTOMY, BILATERAL MYRINGOTOMY AND TUBES  1951   Procedure: TONSILECTOMY, ADENOIDECTOMY, BILATERAL MYRINGOTOMY AND TUBES  [5] Social History Tobacco Use  Smoking Status Former  . Current packs/day: 0.00  . Types: Cigarettes  . Quit date: 04/24/1952  . Years since quitting: 71.8  Smokeless Tobacco Never

## 2024-03-02 ENCOUNTER — Other Ambulatory Visit: Payer: Self-pay | Admitting: Neurology

## 2024-03-03 NOTE — Progress Notes (Addendum)
 Payor: [employer] MEDICARE COMPLETE CHOICE [place] / Plan: [employer] MEDICARE COMPLETE CHOICE [place] / Product Type: Medicare Advantage /   History of Present Illness   History of Present Illness The patient is a 89 year old male who presents for evaluation of fever, chills, and weakness that began this morning with his son.  He experienced an abrupt onset of shaking at approximately 5:00 AM today, which was initially managed with additional bedding. His temperature was recorded as 103 degrees around 6:00 AM. The shaking persisted until breakfast time, after which it began to subside. A subsequent temperature check revealed a decrease to 101 degrees. At approximately 10:30 AM, he was administered Tylenol , resulting in a further reduction of his temperature to 98.3 degrees. He reports feeling unusually weak and slightly short of breath, with the shaking having ceased around noon. He reports no recent coughing, respiratory symptoms, nasal congestion, or sore throat. He also reports no urinary symptoms or headaches. He underwent the extraction of 3 teeth on 02/29/2024, which was uneventful.  He also reports no history of hip surgery or surgical hardware.  States that he had a negative at-home COVID and flu test completed earlier this morning.   Review of Systems  Constitutional:  Positive for chills, fatigue and fever.  HENT:  Negative for congestion, postnasal drip and sinus pressure.   Respiratory:  Negative for cough.   Cardiovascular:  Negative for chest pain, palpitations and leg swelling.  Neurological:  Negative for dizziness, syncope, light-headedness and headaches.  All other systems reviewed and are negative.   Past Contributory History   Current Medications[1] Allergies[2] Medical History[3] Surgical History[4] Family History[5]    Physical Exam   Vitals:   03/03/24 1346 03/03/24 1354 03/03/24 1444  BP: (!) 97/50 (!) 100/50 (!) 107/53  BP Location: Left arm Right arm Left arm  Patient Position:  Sitting Sitting Sitting  Pulse: 74 82 82  Resp: 18    Temp: 98.3 F (36.8 C)    TempSrc: Oral    SpO2: 95%    Weight: 74.8 kg (165 lb)    Height: 1.86 m (6' 1.23)     Physical Exam Vitals and nursing note reviewed.  Constitutional:      General: He is not in acute distress.    Appearance: Normal appearance. He is normal weight. He is not ill-appearing, toxic-appearing or diaphoretic.  HENT:     Head: Normocephalic and atraumatic.     Right Ear: Tympanic membrane, ear canal and external ear normal.     Left Ear: Ear canal and external ear normal.     Nose: Nose normal.     Mouth/Throat:     Lips: Pink.     Mouth: Mucous membranes are moist.     Pharynx: Oropharynx is clear. Uvula midline.  Eyes:     Conjunctiva/sclera: Conjunctivae normal.     Pupils: Pupils are equal, round, and reactive to light.  Cardiovascular:     Rate and Rhythm: Normal rate and regular rhythm.  Pulmonary:     Effort: Pulmonary effort is normal. No respiratory distress.     Breath sounds: Normal breath sounds. No stridor. No wheezing, rhonchi or rales.  Chest:     Chest wall: No tenderness.  Abdominal:     General: Abdomen is flat. Bowel sounds are normal.     Palpations: Abdomen is soft.     Tenderness: There is no abdominal tenderness. There is no right CVA tenderness, left CVA tenderness, guarding or rebound.  Musculoskeletal:  General: Normal range of motion.     Cervical back: Normal range of motion and neck supple.  Skin:    General: Skin is warm.  Neurological:     General: No focal deficit present.     Mental Status: He is alert and oriented to person, place, and time.  Psychiatric:        Mood and Affect: Mood normal.        Behavior: Behavior is cooperative.    Procedures   Procedures  Results   Lab results: Recent Results (from the past 24 hours)  POC Urinalysis Auto without Microscopic   Collection Time: 03/03/24  2:30 PM  Result Value Ref Range   Color, Urine  Yellow Yellow   Clarity, Urine Clear Clear   Glucose, Urine Negative Negative mg/dL   Bilirubin, Urine Negative Negative   Ketones, Urine Negative Negative mg/dL   Specific Gravity, Urine 1.020 1.010, 1.015, 1.020, 1.025   Blood, Urine Negative Negative   pH, Urine 6.0 5.0, 5.5, 6.0, 6.5, 7.0, 7.5, 8.0   Protein, Urine Negative Negative mg/dL   Urobilinogen, Urine 0.2 <2.0 mg/dL   Nitrite, Urine Negative Negative   Leukocyte Esterase, Urine Negative Negative   Kit/Device Lot # [id number]    Kit/Device Expiration Date 12/2024     X-ray results: Radiology Results (last 7 days)     Procedure Component Value Units Date/Time   XR Chest 2 Views [[id number]] Collected: 03/03/24 1537   Order Status: Completed Updated: 03/03/24 1620   Narrative:     XR CHEST 2 VIEWS, 03/03/2024 2:58 PM  INDICATION: Fever 103.0  x 1 day.  No other symptoms, Fever, unspecified \ R50.9 Fever, unspecified  Fever 103.0  x 1 day.  No other symptoms COMPARISON: Chest x-ray 06/07/2022.  FINDINGS:   Cardiovascular: Watchman device. Enlarged cardiomediastinal shadow. Enlarged pulmonary artery. Lungs/pleura: Similar biapical pleural thickening/scarring. New bilateral peripheral consolidations. Similar background of interstitial opacities [better characterized on CT scan done on 03/29/2021]. Upper abdomen: Visualized portions are unremarkable.  Chest wall/osseous structures: No interval osseous changes.     Impression:     1.  Enlarged pulmonary artery, can be seen in pulmonary hypertension 2.  New bilateral peripheral opacities can represent multifocal pneumonia. Follow-up in 6-8 weeks is recommended to document resolution.       Diagnosis & Disposition   1. Fever, unspecified fever cause  POC Urinalysis Auto without Microscopic   CBC with Differential   Comprehensive Metabolic Panel   XR Chest 2 Views    2. Multifocal pneumonia  levoFLOXacin (LEVAQUIN) 750 mg tablet      Assessment & Plan 1. Fever: -  The fever started this morning with a temperature of 1082F, which reduced to 98.82F after taking Tylenol . He is currently afebrile.  Physical exam reveals no acute abnormality.  Blood pressure slightly low (last check was 107/53).  Offered COVID/flu testing home but declined.  Discussed obtaining EKG for further workup of fatigue, but declined.  UA with without any acute abnormality.  Will obtain CBC, CMP, and chest x-ray for further workup and evaluation.  Encouraged continued supportive care with Tylenol , increase fluid intake, and continued observation.  Discussed at length worrisome signs and symptoms when to call/return to clinic/report emergency department.  The patient and his son both verbalized understanding of the plan of care and had no further questions at this time.  2.Multifocal pneumonia - X-ray suggestive of multifocal pneumonia.  Still pending CBC/CMP.  Called and discussed this results with the  patient as well as his wife.  Discussed risk versus benefit of inpatient versus outpatient management.  After discussion, patient and his wife elected to proceed with outpatient management with Levaquin.  Encouraged him to hold his hydroxyzine while taking the Levaquin.  Discussed at length worrisome signs and symptoms when to call/return to clinic/or emergency department.  Encouraged follow-up with PCP in the next 3 to 5 days for recheck.  Also encouraged to repeat CXR in 6 to 8 weeks to confirm resolution.  Patient and his wife both verbalized understanding of the plan of care and has no further questions at this time.   Counseled regarding condition(s) and all patient questions answered. If a new prescription was given today, then I discussed potential side effects, drug interactions, and instructions for taking the medication. Reviewed worrisome signs and symptoms to watch for and instructed patient to seek medical attention for any worsening, prolonged, changing, or new symptoms. I have discussed  the signs and symptoms that would warrant proceeding to an emergency department.  All questions have been answered and the patient has voiced understanding and agreement with the plan of care.   Return in 3 days (on 03/06/2024) for Follow-up with PCP.  There are no Patient Instructions on file for this visit.   This documented history was created with the aid of Dragon dictation software as well as [employer], an automated transcribing artificial intelligence program, with light post-production editing.       [1] Current Outpatient Medications  Medication Sig Dispense Refill  . acetaminophen  (TYLENOL ) 325 mg tablet Take 650 mg by mouth every 6 (six) hours as needed.    SABRA ammonium lactate (LAC-HYDRIN) 12 % lotion Apply topically as needed for dry skin. 396 g 2  . cholecalciferol 25 mcg (1,000 unit) cap Take 1,000 Units by mouth Once Daily.    . cholestyramine 4 g packet Take 1 packet (4 g total) by mouth 2 (two) times a day. Take one packet twice daily (make sure other drugs are taken either 2 hours prior to cholestyramine or 4 hours after cholestyramine) 60 packet 3  . cyanocobalamin, vitamin B-12, 500 mcg subl Take 2 tablets (1,000 mcg total) by mouth daily.    . cycloSPORINE  (RESTASIS ) 0.05 % ophthalmic emulsion Administer 1 drop into both eyes 2 (two) times a day. 60 each 3  . Eliquis  2.5 mg tab Take 2.5 mg by mouth 2 (two) times a day.    . fluocinonide (LIDEX) 0.05 % cream Apply 1 Application topically 2 (two) times a day as needed.    . fluticasone propionate (FLONASE) 50 mcg/spray nasal spray Administer 2 sprays into each nostril Once Daily for 14 days. 1 each 0  . hydrOXYzine (ATARAX) 25 mg tablet Take 1 tablet (25 mg total) by mouth at bedtime. 90 tablet 0  . latanoprost  (XALATAN ) 0.005 % ophthalmic solution Administer 1 drop into both eyes at bedtime. 7.5 mL 3  . levoFLOXacin (LEVAQUIN) 750 mg tablet Take 1 tablet (750 mg total) by mouth daily for 5 days. 5 tablet 0  .  levothyroxine  (SYNTHROID ) 88 mcg tablet TAKE 1 TABLET(88 MCG) BY MOUTH DAILY 90 tablet 1  . mupirocin (BACTROBAN) 2 % ointment Apply  topically.    SABRA ophthalmic gel carboxymethylcellulose sodium 1 % Administer 1 drop into each eyes 2 (two) times a day.    . polyethylene glycol (GLYCOLAX ) 17 gram packet Take 17 g by mouth Once Daily.    . rosuvastatin  (CRESTOR ) 20 mg tablet TAKE  1 TABLET(20 MG) BY MOUTH DAILY 90 tablet 1  . sodium bicarb-sodium chloride  (Neilmed Sinus Rinse Refill) pack by sinus irrigation route Once Daily.    . sodium chloride  (Muro 128) 2 % drop eye drops Administer 1 drop into each eyes nightly.    . triamcinolone 0.1% cream-eucerin cream 1:1 APPLY TOPICALLY DAILY AS DIRECTED     Current Facility-Administered Medications  Medication Dose Route Frequency Provider Last Rate Last Admin  . cyanocobalamin (VITAMIN B12) injection 1,000 mcg  1,000 mcg intramuscular Q30 Days Edwin Silva Zapata, MD   1,000 mcg at 02/22/24 1012  [2] Allergies Allergen Reactions  . Codeine Other (See Comments)  [3] Past Medical History: Diagnosis Date  . Abnormal heart rhythm   . Allergy   . Ambulates with cane   . Anemia   . Arthritis   . Atrial fibrillation    (CMD)   . Brain bleed    per wife  . CHF (congestive heart failure)    (CMD)   . Glaucoma   . Glaucoma suspect of both eyes   . Hearing loss    wears hearing aids  . Hypertension   . Hypotension    off Metoprolol  . Hypothyroidism   . Kidney stone   . Macular degeneration   . Presence of Watchman left atrial appendage closure device 03/2016  . Sleep apnea   . Stroke    (CMD)   . Thyroid disease   [4] Past Surgical History: Procedure Laterality Date  . APPENDECTOMY     Procedure: APPENDECTOMY  . BACK SURGERY  2005   Procedure: BACK SURGERY; lumbar  . BLADDER SURGERY  2018   Procedure: BLADDER SURGERY; removal of scar tissue  . CATARACT EXTRACTION     Procedure: CATARACT EXTRACTION  . COLONOSCOPY  04/2007    Procedure: COLONOSCOPY  . KIDNEY STONE SURGERY     Procedure: KIDNEY STONE SURGERY  . NOSE SURGERY     Procedure: NOSE SURGERY  . PROSTATE SURGERY     Procedure: PROSTATE SURGERY; TURP 2014 2015 2016  . ROTATOR CUFF REPAIR Left 2011   Procedure: ROTATOR CUFF REPAIR  . TONSILECTOMY, ADENOIDECTOMY, BILATERAL MYRINGOTOMY AND TUBES  1951   Procedure: TONSILECTOMY, ADENOIDECTOMY, BILATERAL MYRINGOTOMY AND TUBES  [5] Family History Problem Relation Name Age of Onset  . Heart failure Sister    . Glaucoma Neg Hx    . Macular degeneration Neg Hx    . Anesthesia problems Neg Hx    . Clotting disorder Neg Hx

## 2024-03-04 ENCOUNTER — Inpatient Hospital Stay (HOSPITAL_COMMUNITY)
Admission: EM | Admit: 2024-03-04 | Discharge: 2024-03-07 | DRG: 194 | Disposition: A | Discharge status: Skilled Nursing Facility | Attending: Internal Medicine | Admitting: Internal Medicine

## 2024-03-04 ENCOUNTER — Emergency Department (HOSPITAL_COMMUNITY)

## 2024-03-04 DIAGNOSIS — Z7901 Long term (current) use of anticoagulants: Secondary | ICD-10-CM | POA: Diagnosis not present

## 2024-03-04 DIAGNOSIS — E871 Hypo-osmolality and hyponatremia: Secondary | ICD-10-CM | POA: Diagnosis present

## 2024-03-04 DIAGNOSIS — I5042 Chronic combined systolic (congestive) and diastolic (congestive) heart failure: Secondary | ICD-10-CM | POA: Diagnosis present

## 2024-03-04 DIAGNOSIS — Z79890 Hormone replacement therapy (postmenopausal): Secondary | ICD-10-CM | POA: Diagnosis not present

## 2024-03-04 DIAGNOSIS — Z79899 Other long term (current) drug therapy: Secondary | ICD-10-CM

## 2024-03-04 DIAGNOSIS — I428 Other cardiomyopathies: Secondary | ICD-10-CM | POA: Diagnosis present

## 2024-03-04 DIAGNOSIS — R9431 Abnormal electrocardiogram [ECG] [EKG]: Secondary | ICD-10-CM | POA: Diagnosis not present

## 2024-03-04 DIAGNOSIS — Z1152 Encounter for screening for COVID-19: Secondary | ICD-10-CM

## 2024-03-04 DIAGNOSIS — L299 Pruritus, unspecified: Secondary | ICD-10-CM | POA: Diagnosis present

## 2024-03-04 DIAGNOSIS — Z6822 Body mass index (BMI) 22.0-22.9, adult: Secondary | ICD-10-CM

## 2024-03-04 DIAGNOSIS — D509 Iron deficiency anemia, unspecified: Secondary | ICD-10-CM | POA: Diagnosis present

## 2024-03-04 DIAGNOSIS — Z95818 Presence of other cardiac implants and grafts: Secondary | ICD-10-CM

## 2024-03-04 DIAGNOSIS — J189 Pneumonia, unspecified organism: Principal | ICD-10-CM | POA: Diagnosis present

## 2024-03-04 DIAGNOSIS — K219 Gastro-esophageal reflux disease without esophagitis: Secondary | ICD-10-CM | POA: Diagnosis present

## 2024-03-04 DIAGNOSIS — I4811 Longstanding persistent atrial fibrillation: Secondary | ICD-10-CM | POA: Diagnosis not present

## 2024-03-04 DIAGNOSIS — H9193 Unspecified hearing loss, bilateral: Secondary | ICD-10-CM | POA: Diagnosis present

## 2024-03-04 DIAGNOSIS — R5381 Other malaise: Secondary | ICD-10-CM | POA: Diagnosis present

## 2024-03-04 DIAGNOSIS — K769 Liver disease, unspecified: Secondary | ICD-10-CM | POA: Diagnosis present

## 2024-03-04 DIAGNOSIS — I10 Essential (primary) hypertension: Secondary | ICD-10-CM | POA: Diagnosis not present

## 2024-03-04 DIAGNOSIS — I110 Hypertensive heart disease with heart failure: Secondary | ICD-10-CM | POA: Diagnosis present

## 2024-03-04 DIAGNOSIS — Z8249 Family history of ischemic heart disease and other diseases of the circulatory system: Secondary | ICD-10-CM | POA: Diagnosis not present

## 2024-03-04 DIAGNOSIS — I480 Paroxysmal atrial fibrillation: Secondary | ICD-10-CM | POA: Diagnosis present

## 2024-03-04 DIAGNOSIS — E440 Moderate protein-calorie malnutrition: Secondary | ICD-10-CM | POA: Diagnosis present

## 2024-03-04 DIAGNOSIS — W19XXXA Unspecified fall, initial encounter: Secondary | ICD-10-CM | POA: Diagnosis present

## 2024-03-04 DIAGNOSIS — Z87891 Personal history of nicotine dependence: Secondary | ICD-10-CM

## 2024-03-04 DIAGNOSIS — Z66 Do not resuscitate: Secondary | ICD-10-CM | POA: Diagnosis present

## 2024-03-04 DIAGNOSIS — G8929 Other chronic pain: Secondary | ICD-10-CM | POA: Diagnosis present

## 2024-03-04 DIAGNOSIS — M545 Low back pain, unspecified: Secondary | ICD-10-CM | POA: Diagnosis present

## 2024-03-04 DIAGNOSIS — E785 Hyperlipidemia, unspecified: Secondary | ICD-10-CM | POA: Diagnosis present

## 2024-03-04 DIAGNOSIS — I5022 Chronic systolic (congestive) heart failure: Secondary | ICD-10-CM | POA: Diagnosis present

## 2024-03-04 DIAGNOSIS — E039 Hypothyroidism, unspecified: Secondary | ICD-10-CM | POA: Diagnosis present

## 2024-03-04 DIAGNOSIS — M25551 Pain in right hip: Secondary | ICD-10-CM | POA: Diagnosis present

## 2024-03-04 DIAGNOSIS — R531 Weakness: Secondary | ICD-10-CM

## 2024-03-04 DIAGNOSIS — I4891 Unspecified atrial fibrillation: Secondary | ICD-10-CM | POA: Diagnosis present

## 2024-03-04 LAB — MAGNESIUM: Magnesium: 2 mg/dL (ref 1.7–2.4)

## 2024-03-04 LAB — COMPREHENSIVE METABOLIC PANEL WITH GFR
ALT: 32 U/L (ref 0–44)
AST: 29 U/L (ref 15–41)
Albumin: 3.3 g/dL — ABNORMAL LOW (ref 3.5–5.0)
Alkaline Phosphatase: 314 U/L — ABNORMAL HIGH (ref 38–126)
Anion gap: 12 (ref 5–15)
BUN: 15 mg/dL (ref 8–23)
CO2: 24 mmol/L (ref 22–32)
Calcium: 9.1 mg/dL (ref 8.9–10.3)
Chloride: 95 mmol/L — ABNORMAL LOW (ref 98–111)
Creatinine, Ser: 0.95 mg/dL (ref 0.61–1.24)
GFR, Estimated: >60 mL/min (ref >60)
Glucose, Bld: 120 mg/dL — ABNORMAL HIGH (ref 70–99)
Potassium: 4.2 mmol/L (ref 3.5–5.1)
Sodium: 131 mmol/L — ABNORMAL LOW (ref 135–145)
Total Bilirubin: 0.4 mg/dL (ref 0.0–1.2)
Total Protein: 7.5 g/dL (ref 6.5–8.1)

## 2024-03-04 LAB — APTT: aPTT: 42 s — ABNORMAL HIGH (ref 24–36)

## 2024-03-04 LAB — I-STAT CHEM 8, ED
BUN: 16 mg/dL (ref 8–23)
Calcium, Ion: 1.18 mmol/L (ref 1.15–1.40)
Chloride: 97 mmol/L — ABNORMAL LOW (ref 98–111)
Creatinine, Ser: 1 mg/dL (ref 0.61–1.24)
Glucose, Bld: 114 mg/dL — ABNORMAL HIGH (ref 70–99)
HCT: 38 % — ABNORMAL LOW (ref 39.0–52.0)
Hemoglobin: 12.9 g/dL — ABNORMAL LOW (ref 13.0–17.0)
Potassium: 4.3 mmol/L (ref 3.5–5.1)
Sodium: 133 mmol/L — ABNORMAL LOW (ref 135–145)
TCO2: 25 mmol/L (ref 22–32)

## 2024-03-04 LAB — RETICULOCYTES
Immature Retic Fract: 10.2 % (ref 2.3–15.9)
RBC.: 3.65 MIL/uL — ABNORMAL LOW (ref 4.22–5.81)
Retic Count, Absolute: 52.6 K/uL (ref 19.0–186.0)
Retic Ct Pct: 1.4 % (ref 0.4–3.1)

## 2024-03-04 LAB — URINALYSIS, ROUTINE W REFLEX MICROSCOPIC
Bilirubin Urine: NEGATIVE
Glucose, UA: NEGATIVE mg/dL
Hgb urine dipstick: NEGATIVE
Ketones, ur: NEGATIVE mg/dL
Leukocytes,Ua: NEGATIVE
Nitrite: NEGATIVE
Protein, ur: NEGATIVE mg/dL
Specific Gravity, Urine: 1.012 (ref 1.005–1.030)
pH: 6 (ref 5.0–8.0)

## 2024-03-04 LAB — SAMPLE TO BLOOD BANK

## 2024-03-04 LAB — I-STAT CG4 LACTIC ACID, ED
Lactic Acid, Venous: 0.9 mmol/L (ref 0.5–1.9)
Lactic Acid, Venous: 0.7 mmol/L (ref 0.5–1.9)

## 2024-03-04 LAB — CBC
HCT: 35 % — ABNORMAL LOW (ref 39.0–52.0)
Hemoglobin: 11.4 g/dL — ABNORMAL LOW (ref 13.0–17.0)
MCH: 30.8 pg (ref 26.0–34.0)
MCHC: 32.6 g/dL (ref 30.0–36.0)
MCV: 94.6 fL (ref 80.0–100.0)
Platelets: 192 K/uL (ref 150–400)
RBC: 3.7 MIL/uL — ABNORMAL LOW (ref 4.22–5.81)
RDW: 14.7 % (ref 11.5–15.5)
WBC: 7.4 K/uL (ref 4.0–10.5)
nRBC: 0 % (ref 0.0–0.2)

## 2024-03-04 LAB — PROCALCITONIN: Procalcitonin: <0.1 ng/mL

## 2024-03-04 LAB — PHOSPHORUS: Phosphorus: 2.3 mg/dL — ABNORMAL LOW (ref 2.5–4.6)

## 2024-03-04 LAB — PROTIME-INR
INR: 1.2 (ref 0.8–1.2)
Prothrombin Time: 15.8 s — ABNORMAL HIGH (ref 11.4–15.2)

## 2024-03-04 LAB — TROPONIN I (HIGH SENSITIVITY): Troponin I (High Sensitivity): 16 ng/L (ref <18)

## 2024-03-04 LAB — CREATININE, URINE, RANDOM: Creatinine, Urine: 37 mg/dL

## 2024-03-04 LAB — OSMOLALITY, URINE: Osmolality, Ur: 267 mosm/kg — ABNORMAL LOW (ref 300–900)

## 2024-03-04 LAB — OSMOLALITY: Osmolality: 284 mosm/kg (ref 275–295)

## 2024-03-04 LAB — CK: Total CK: 138 U/L (ref 49–397)

## 2024-03-04 MED ORDER — ACETAMINOPHEN 325 MG PO TABS: 650.0000 mg | ORAL_TABLET | Freq: Four times a day (QID) | ORAL | Status: DC | PRN

## 2024-03-04 MED ORDER — SODIUM CHLORIDE 0.9 % IV SOLN
1.0000 g | Freq: Once | INTRAVENOUS | Status: AC
  Administered 2024-03-04: 1 g via INTRAVENOUS
  Filled 2024-03-04: qty 10

## 2024-03-04 MED ORDER — PANTOPRAZOLE SODIUM 40 MG PO TBEC
40.0000 mg | DELAYED_RELEASE_TABLET | Freq: Every day | ORAL | Status: DC
  Administered 2024-03-05 – 2024-03-07 (×3): 40 mg via ORAL
  Filled 2024-03-04 (×3): qty 1

## 2024-03-04 MED ORDER — IOHEXOL 350 MG/ML SOLN
85.0000 mL | Freq: Once | INTRAVENOUS | Status: AC | PRN
  Administered 2024-03-04: 85 mL via INTRAVENOUS

## 2024-03-04 MED ORDER — LATANOPROST 0.005 % OP SOLN
1.0000 [drp] | Freq: Every day | OPHTHALMIC | Status: DC
  Administered 2024-03-05 – 2024-03-06 (×2): 1 [drp] via OPHTHALMIC
  Filled 2024-03-04 (×3): qty 2.5

## 2024-03-04 MED ORDER — SODIUM PHOSPHATES 45 MMOLE/15ML IV SOLN
15.0000 mmol | Freq: Once | INTRAVENOUS | Status: AC
  Administered 2024-03-05: 15 mmol via INTRAVENOUS
  Filled 2024-03-04 (×2): qty 5

## 2024-03-04 MED ORDER — SODIUM CHLORIDE 0.9 % IV SOLN: INTRAVENOUS | Status: AC

## 2024-03-04 MED ORDER — APIXABAN 2.5 MG PO TABS
2.5000 mg | ORAL_TABLET | Freq: Two times a day (BID) | ORAL | Status: DC
  Administered 2024-03-04 – 2024-03-07 (×6): 2.5 mg via ORAL
  Filled 2024-03-04 (×6): qty 1

## 2024-03-04 MED ORDER — SODIUM CHLORIDE 0.9 % IV SOLN: 500.0000 mg | Freq: Once | INTRAVENOUS | Status: DC

## 2024-03-04 MED ORDER — SODIUM CHLORIDE 0.9 % IV SOLN
100.0000 mg | Freq: Two times a day (BID) | INTRAVENOUS | Status: DC
  Administered 2024-03-05 (×3): 100 mg via INTRAVENOUS
  Filled 2024-03-04 (×4): qty 100

## 2024-03-04 MED ORDER — HYDROCODONE-ACETAMINOPHEN 5-325 MG PO TABS
1.0000 | ORAL_TABLET | ORAL | Status: DC | PRN
  Administered 2024-03-05: 2 via ORAL
  Administered 2024-03-05: 1 via ORAL
  Administered 2024-03-06: 2 via ORAL
  Filled 2024-03-04: qty 2
  Filled 2024-03-04: qty 1
  Filled 2024-03-04: qty 2

## 2024-03-04 MED ORDER — ACETAMINOPHEN 650 MG RE SUPP: 650.0000 mg | Freq: Four times a day (QID) | RECTAL | Status: DC | PRN

## 2024-03-04 MED ORDER — SODIUM CHLORIDE 0.9 % IV SOLN
2.0000 g | INTRAVENOUS | Status: DC
  Administered 2024-03-05 – 2024-03-06 (×2): 2 g via INTRAVENOUS
  Filled 2024-03-04 (×2): qty 20

## 2024-03-04 MED ORDER — POLYETHYLENE GLYCOL 3350 17 G PO PACK
17.0000 g | PACK | Freq: Every day | ORAL | Status: DC
  Administered 2024-03-05 – 2024-03-07 (×3): 17 g via ORAL
  Filled 2024-03-04 (×5): qty 1

## 2024-03-04 MED ORDER — SODIUM CHLORIDE 0.9 % IV SOLN: 500.0000 mg | INTRAVENOUS | Status: DC

## 2024-03-04 MED ORDER — HYDROXYZINE HCL 10 MG PO TABS
10.0000 mg | ORAL_TABLET | Freq: Four times a day (QID) | ORAL | Status: DC | PRN
  Administered 2024-03-07: 10 mg via ORAL
  Filled 2024-03-04: qty 1

## 2024-03-04 MED ORDER — CHOLESTYRAMINE LIGHT 4 G PO PACK
4.0000 g | PACK | Freq: Two times a day (BID) | ORAL | Status: DC
  Filled 2024-03-04 (×8): qty 1

## 2024-03-04 MED ORDER — LEVOTHYROXINE SODIUM 88 MCG PO TABS
88.0000 ug | ORAL_TABLET | Freq: Every day | ORAL | Status: DC
  Administered 2024-03-05 – 2024-03-06 (×2): 88 ug via ORAL
  Filled 2024-03-04 (×2): qty 1

## 2024-03-04 NOTE — Assessment & Plan Note (Signed)
-  will monitor on tele avoid QT prolonging medications, rehydrate correct electrolytes ? ?

## 2024-03-04 NOTE — Assessment & Plan Note (Signed)
 Will gently rehydrate Order urine electrolytes

## 2024-03-04 NOTE — Telephone Encounter (Signed)
 Copied from CRM #[id number]. Topic: Schedule Appointment - Schedule Patient >> Mar 04, 2024 11:29 AM Suzen DASEN wrote: Tuggle,Helen is calling other request    Include all details related to the request(s) below: Patient's spouse Elijah Stone is calling to schedule an Urgent Care follow up appointment with PCP if at all possible in one week. Elijah Stone advised that UC wanted patient to follow up in one week for the appointment.  High priority message sent to [facility].    Confirm and type the Best Contact Number below:  Patient/caller contact number:      [phone]       [] Home  [] Mobile  [] Work [] Other   [x] Okay to leave a voicemail   Medication List:  Current Outpatient Medications:  .  acetaminophen  (TYLENOL ) 325 mg tablet, Take 650 mg by mouth every 6 (six) hours as needed., Disp: , Rfl:  .  ammonium lactate (LAC-HYDRIN) 12 % lotion, Apply topically as needed for dry skin., Disp: 396 g, Rfl: 2 .  cholecalciferol 25 mcg (1,000 unit) cap, Take 1,000 Units by mouth Once Daily., Disp: , Rfl:  .  cholestyramine 4 g packet, Take 1 packet (4 g total) by mouth 2 (two) times a day. Take one packet twice daily (make sure other drugs are taken either 2 hours prior to cholestyramine or 4 hours after cholestyramine), Disp: 60 packet, Rfl: 3 .  cyanocobalamin, vitamin B-12, 500 mcg subl, Take 2 tablets (1,000 mcg total) by mouth daily., Disp: , Rfl:  .  cycloSPORINE  (RESTASIS ) 0.05 % ophthalmic emulsion, Administer 1 drop into both eyes 2 (two) times a day., Disp: 60 each, Rfl: 3 .  Eliquis  2.5 mg tab, Take 2.5 mg by mouth 2 (two) times a day., Disp: , Rfl:  .  fluocinonide (LIDEX) 0.05 % cream, Apply 1 Application topically 2 (two) times a day as needed., Disp: , Rfl:  .  fluticasone propionate (FLONASE) 50 mcg/spray nasal spray, Administer 2 sprays into each nostril Once Daily for 14 days., Disp: 1 each, Rfl: 0 .  hydrOXYzine (ATARAX) 25 mg tablet, Take 1 tablet (25 mg total) by mouth at bedtime.,  Disp: 90 tablet, Rfl: 0 .  latanoprost  (XALATAN ) 0.005 % ophthalmic solution, Administer 1 drop into both eyes at bedtime., Disp: 7.5 mL, Rfl: 3 .  levoFLOXacin (LEVAQUIN) 750 mg tablet, Take 1 tablet (750 mg total) by mouth daily for 5 days., Disp: 5 tablet, Rfl: 0 .  levothyroxine  (SYNTHROID ) 88 mcg tablet, TAKE 1 TABLET(88 MCG) BY MOUTH DAILY, Disp: 90 tablet, Rfl: 1 .  mupirocin (BACTROBAN) 2 % ointment, Apply  topically., Disp: , Rfl:  .  ophthalmic gel carboxymethylcellulose sodium 1 %, Administer 1 drop into each eyes 2 (two) times a day., Disp: , Rfl:  .  polyethylene glycol (GLYCOLAX ) 17 gram packet, Take 17 g by mouth Once Daily., Disp: , Rfl:  .  rosuvastatin  (CRESTOR ) 20 mg tablet, TAKE 1 TABLET(20 MG) BY MOUTH DAILY, Disp: 90 tablet, Rfl: 1 .  sodium bicarb-sodium chloride  (Neilmed Sinus Rinse Refill) pack, by sinus irrigation route Once Daily., Disp: , Rfl:  .  sodium chloride  (Muro 128) 2 % drop eye drops, Administer 1 drop into each eyes nightly., Disp: , Rfl:  .  triamcinolone 0.1% cream-eucerin cream 1:1, APPLY TOPICALLY DAILY AS DIRECTED, Disp: , Rfl:   Current Facility-Administered Medications:  .  cyanocobalamin (VITAMIN B12) injection 1,000 mcg, 1,000 mcg, intramuscular, Q30 Days, Aliene Nikki Rams, MD, 1,000 mcg at 02/22/24 1012  Medication Request/Refills: Pharmacy Information (if applicable)   [x] Not Applicable       []  Pharmacy listed  Send Medication Request to:                                                 [] Pharmacy not listed (added to pharmacy list in Epic) Send Medication Request to:      Listed Pharmacies: [id number] - [place], [place] - [address] - PHONE: [phone] - FAX: [phone] [facility]

## 2024-03-04 NOTE — H&P (Signed)
 Elijah Stone FMW:[id number] DOB: Sep 28, 1930 DOA: 03/04/2024     PCP: Nikki Hansel Atlas, MD   Outpatient Specialists:   CARDS:   Dr.  Rojelio    NEurology   Dr. Rosemarie    GI Dr. Ladora      Patient arrived to ER on 03/04/24 at 1849 Referred by Attending Dreama Longs, MD   Patient coming from:    home Lives With family    Chief Complaint:   Chief Complaint  Patient presents with   Fall    HPI: Elijah Stone is a 89 y.o. male with medical history significant of   a.fib on eliquis  status post Watchman device, hypothyroidism, HLD systolic CHF EF 25%    Presented with  fall generalized weakness Patient felt too weak to get off the commode  today and had a  fall hitting head while on eliquis  He tried to get up from sitting on the toilet felt weak and fell  He hit his head and back reported back right arm and leg pain  No fever no chills  No focal neurological complaints Has been weak all over Was seen yesterday or fevers chills and weakness At home supposed to have a fever up to 103 He did tell provider that fever finally resolved after Tylenol  he did endorse that he had 3 teeth extracted on 7 November but otherwise have not had any respiratory symptoms no urinary complaints he did do COVID test at home which was negative At the provider office blood pressure was 97/50 UA showed no evidence of UTI Chest x-ray showed possible multifocal pneumonia He was started on Levaquin 750 mg daily  Of note patient have had some pruritus over the past few weeks to months he was found to have elevated LFTs and particularly alk phos and GGT with ultrasound showing liver disease and referred to gastroenterology Hepatic serologies negative for hepatitis AMA is normal He was started on cholestyramine 4 g twice a day  Denies significant ETOH intake   Does not smoke      Regarding pertinent Chronic problems:    Hyperlipidemia - off  statins crestor  Lipid Panel     Component Value  Date/Time   CHOL 175 09/12/2021 1745   TRIG 37 09/13/2021 0540   HDL 59 09/12/2021 1745   CHOLHDL 3.0 09/12/2021 1745   VLDL 13 09/12/2021 1745   LDLCALC 103 (H) 09/12/2021 1745      chronic CHF  systolic - on entersto last echo  Recent Results (from the past 56199 hours)  ECHO TEE   Collection Time: 09/13/21  2:23 PM   Narrative    NICM with mild-moderately reduced LV function, EF 35-40%    Hypothyroidism:   Lab Results  Component Value Date   TSH 0.212 (L) 09/13/2021   on synthroid      A. Fib -   atrial fibrillation CHA2DS2 vas score   6      current  on anticoagulation with Eliquis ,   s/p Watchman             Liver disease MELD 3.0: 11 at 03/04/2024  7:11 PM MELD-Na: 8 at 03/04/2024  7:11 PM Calculated from: Serum Creatinine: 1 mg/dL at 88/88/7974  2:88 PM Serum Sodium: 133 mmol/L at 03/04/2024  7:11 PM Total Bilirubin: 0.4 mg/dL (Using min of 1 mg/dL) at 88/88/7974  3:44 PM Serum Albumin: 3.3 g/dL at 88/88/7974  3:44 PM INR(ratio): 1.2 at 03/04/2024  6:55 PM Age at listing (hypothetical): 89 years  Sex: Male at 03/04/2024  7:11 PM  Hepatic Function Panel     Component Value Date/Time   PROT 7.5 03/04/2024 1855   ALBUMIN 3.3 (L) 03/04/2024 1855   AST 29 03/04/2024 1855   ALT 32 03/04/2024 1855   ALKPHOS 314 (H) 03/04/2024 1855   BILITOT 0.4 03/04/2024 1855   1.2   Chronic anemia - baseline hg Hemoglobin & Hematocrit  Recent Labs    03/04/24 1855 03/04/24 1911  HGB 11.4* 12.9*      Hx of Stroke R MCA stroke (08/2021) s/p embolectomy   While in ER:   CT imaging showed no intracranial bleeding but confirm suspected multifocal pneumonia     Lab Orders         Blood culture (routine x 2)         Comprehensive metabolic panel         CBC         Urinalysis, Routine w reflex microscopic -Urine, Clean Catch         Protime-INR         APTT         I-Stat Chem 8, ED         I-Stat Lactic Acid, ED         I-Stat CG4 Lactic Acid     Pelvis no  evidence of acute traumatic injury CT HEAD   NON acute CT L and T-spine no acute abnormalities  CXR -multifocal patchy opacities  CTAabd/pelvis chest -   evidence of infiltrate  Following Medications were ordered in ER: Medications  cefTRIAXone (ROCEPHIN) 1 g in sodium chloride  0.9 % 100 mL IVPB (has no administration in time range)  azithromycin (ZITHROMAX) 500 mg in sodium chloride  0.9 % 250 mL IVPB (has no administration in time range)  iohexol  (OMNIPAQUE ) 350 MG/ML injection 85 mL (85 mLs Intravenous Contrast Given 03/04/24 1939)        ED Triage Vitals  Encounter Vitals Group     BP 03/04/24 1857 (!) 144/92     Girls Systolic BP Percentile --      Girls Diastolic BP Percentile --      Boys Systolic BP Percentile --      Boys Diastolic BP Percentile --      Pulse Rate 03/04/24 1903 99     Resp 03/04/24 1903 20     Temp 03/04/24 1909 98.5 F (36.9 C)     Temp src --      SpO2 03/04/24 1903 100 %     Weight 03/04/24 2003 165 lb (74.8 kg)     Height 03/04/24 2003 6' 3 (1.905 m)     Head Circumference --      Peak Flow --      Pain Score 03/04/24 1953 0     Pain Loc --      Pain Education --      Exclude from Growth Chart --   UFJK(75)@     _________________________________________ Significant initial  Findings: Abnormal Labs Reviewed  COMPREHENSIVE METABOLIC PANEL WITH GFR - Abnormal; Notable for the following components:      Result Value   Sodium 131 (*)    Chloride 95 (*)    Glucose, Bld 120 (*)    Albumin 3.3 (*)    Alkaline Phosphatase 314 (*)    All other components within normal limits  CBC - Abnormal; Notable for the following components:   RBC 3.70 (*)    Hemoglobin 11.4 (*)  HCT 35.0 (*)    All other components within normal limits  PROTIME-INR - Abnormal; Notable for the following components:   Prothrombin Time 15.8 (*)    All other components within normal limits  APTT - Abnormal; Notable for the following components:   aPTT 42 (*)    All  other components within normal limits  I-STAT CHEM 8, ED - Abnormal; Notable for the following components:   Sodium 133 (*)    Chloride 97 (*)    Glucose, Bld 114 (*)    Hemoglobin 12.9 (*)    HCT 38.0 (*)    All other components within normal limits      _________________________ Troponin   Cardiac Panel (last 3 results) Recent Labs    03/04/24 1855 03/04/24 2116  CKTOTAL 138  --   TROPONINIHS  --  16     ECG: Ordered Personally reviewed and interpreted by me showing: HR : 93 Rhythm:Atrial fibrillation RBBB and LAFB No significant change since last tracing QTC 496   The recent clinical data is shown below. Vitals:   03/04/24 1904 03/04/24 1909 03/04/24 2000 03/04/24 2003  BP: (!) 153/98  136/72   Pulse:   90   Resp:   13   Temp:  98.5 F (36.9 C)    SpO2:   98%   Weight:    74.8 kg  Height:    6' 3 (1.905 m)    WBC     Component Value Date/Time   WBC 7.4 03/04/2024 1855   LYMPHSABS 0.6 (L) 09/13/2021 0540   MONOABS 0.3 09/13/2021 0540   EOSABS 0.0 09/13/2021 0540   BASOSABS 0.0 09/13/2021 0540    Lactic Acid, Venous    Component Value Date/Time   LATICACIDVEN 0.9 03/04/2024 1911     Procalcitonin   Ordered      UA   no evidence of UTI      Urine analysis:    Component Value Date/Time   COLORURINE YELLOW 03/04/2024 1917   APPEARANCEUR CLEAR 03/04/2024 1917   LABSPEC 1.012 03/04/2024 1917   PHURINE 6.0 03/04/2024 1917   GLUCOSEU NEGATIVE 03/04/2024 1917   HGBUR NEGATIVE 03/04/2024 1917   BILIRUBINUR NEGATIVE 03/04/2024 1917   KETONESUR NEGATIVE 03/04/2024 1917   PROTEINUR NEGATIVE 03/04/2024 1917   NITRITE NEGATIVE 03/04/2024 1917   LEUKOCYTESUR NEGATIVE 03/04/2024 1917    ABX started rocephin zenovia _________________________________________________________    __________________________________________________________ Recent Labs  Lab 03/04/24 1855 03/04/24 1911  NA 131* 133*  K 4.2 4.3  CO2 24  --   GLUCOSE 120* 114*  BUN 15  16  CREATININE 0.95 1.00  CALCIUM  9.1  --     Cr  stable,    Lab Results  Component Value Date   CREATININE 1.00 03/04/2024   CREATININE 0.95 03/04/2024   CREATININE 0.93 09/14/2021    Recent Labs  Lab 03/04/24 1855  AST 29  ALT 32  ALKPHOS 314*  BILITOT 0.4  PROT 7.5  ALBUMIN 3.3*   Lab Results  Component Value Date   CALCIUM  9.1 03/04/2024   PHOS 3.5 09/14/2021    Plt: Lab Results  Component Value Date   PLT 192 03/04/2024    Recent Labs  Lab 03/04/24 1855 03/04/24 1911  WBC 7.4  --   HGB 11.4* 12.9*  HCT 35.0* 38.0*  MCV 94.6  --   PLT 192  --     HG/HCT  stable,      Component Value Date/Time  HGB 12.9 (L) 03/04/2024 1911   HCT 38.0 (L) 03/04/2024 1911   MCV 94.6 03/04/2024 1855    _______________________________________________ Hospitalist was called for admission for   There are no diagnoses linked to this encounter.   The following Work up has been ordered so far:  Orders Placed This Encounter  Procedures   Blood culture (routine x 2)   DG Chest Port 1 View   DG Pelvis Portable   CT HEAD WO CONTRAST   CT Angio Chest/Abd/Pel for Dissection W and/or Wo Contrast   CT Cervical Spine Wo Contrast   CT T-SPINE NO CHARGE   CT L-SPINE NO CHARGE   Comprehensive metabolic panel   CBC   Urinalysis, Routine w reflex microscopic -Urine, Clean Catch   Protime-INR   APTT   Diet NPO time specified   ED Cardiac monitoring   Measure blood pressure   Initiate Carrier Fluid Protocol   Consult for [facility] Admission   ED Pulse oximetry, continuous   I-Stat Chem 8, ED   I-Stat Lactic Acid, ED   I-Stat CG4 Lactic Acid   EKG 12-Lead   Sample to Blood Bank     OTHER Significant initial  Findings:  labs showing:     DM  labs:  HbA1C: No results for input(s): HGBA1C in the last 8760 hours.     CBG (last 3)  No results for input(s): GLUCAP in the last 72 hours.        Cultures: No results found for: SDES, SPECREQUEST,  CULT, REPTSTATUS   Radiological Exams on Admission: CT L-SPINE NO CHARGE Result Date: 03/04/2024 EXAM: CT OF THE LUMBAR SPINE WITHOUT CONTRAST 03/04/2024 07:48:00 PM TECHNIQUE: CT of the lumbar spine was performed without the administration of intravenous contrast. Multiplanar reformatted images are provided for review. Automated exposure control, iterative reconstruction, and/or weight based adjustment of the mA/kV was utilized to reduce the radiation dose to as low as reasonably achievable. COMPARISON: None available. CLINICAL HISTORY: FINDINGS: BONES AND ALIGNMENT: Normal vertebral body heights. No acute fracture or suspicious bone lesion. Mild lumbar levoscoliosis. DEGENERATIVE CHANGES: Mild multilevel degenerative changes. SOFT TISSUES: No acute abnormality. IMPRESSION: 1. No acute findings. Electronically signed by: Pinkie Pebbles MD 03/04/2024 08:03 PM EST RP Workstation: [id number]   CT T-SPINE NO CHARGE Result Date: 03/04/2024 EXAM: CT THORACIC SPINE WITHOUT CONTRAST 03/04/2024 07:48:00 PM TECHNIQUE: CT of the thoracic spine was performed without the administration of intravenous contrast. Multiplanar reformatted images are provided for review. Automated exposure control, iterative reconstruction, and/or weight based adjustment of the mA/kV was utilized to reduce the radiation dose to as low as reasonably achievable. COMPARISON: None available. CLINICAL HISTORY: FINDINGS: BONES AND ALIGNMENT: Normal vertebral body heights. No acute fracture or suspicious bone lesion. Normal alignment. DEGENERATIVE CHANGES: Mild degenerative changes of the lower thoracic spine. SOFT TISSUES: No acute abnormality. IMPRESSION: 1. No acute abnormality of the thoracic spine. Electronically signed by: Pinkie Pebbles MD 03/04/2024 08:02 PM EST RP Workstation: [id number]   CT Cervical Spine Wo Contrast Result Date: 03/04/2024 EXAM: CT CERVICAL SPINE WITHOUT CONTRAST 03/04/2024 07:48:00 PM TECHNIQUE: CT of the  cervical spine was performed without the administration of intravenous contrast. Multiplanar reformatted images are provided for review. Automated exposure control, iterative reconstruction, and/or weight based adjustment of the mA/kV was utilized to reduce the radiation dose to as low as reasonably achievable. COMPARISON: None available. CLINICAL HISTORY: Ataxia, cervical trauma. FINDINGS: CERVICAL SPINE: BONES AND ALIGNMENT: No acute fracture or traumatic malalignment. DEGENERATIVE CHANGES: Mild degenerative changes  of the mid cervical spine. SOFT TISSUES: No prevertebral soft tissue swelling. IMPRESSION: 1. No acute abnormality of the cervical spine. Electronically signed by: Pinkie Pebbles MD 03/04/2024 08:01 PM EST RP Workstation: [id number]   CT Angio Chest/Abd/Pel for Dissection W and/or Wo Contrast Result Date: 03/04/2024 EXAM: CTA CHEST, ABDOMEN AND PELVIS WITHOUT AND WITH CONTRAST 03/04/2024 07:48:00 PM TECHNIQUE: CTA of the chest was performed without and with the administration of 85 mL of intravenous iohexol  (OMNIPAQUE ) 350 MG/ML injection. CTA of the abdomen and pelvis was performed without and with the administration of 85 mL of intravenous iohexol  (OMNIPAQUE ) 350 MG/ML injection. Multiplanar reformatted images are provided for review. MIP images are provided for review. Automated exposure control, iterative reconstruction, and/or weight based adjustment of the mA/kV was utilized to reduce the radiation dose to as low as reasonably achievable. COMPARISON: None available. CLINICAL HISTORY: Acute aortic syndrome (AAS) suspected. FINDINGS: VASCULATURE: AORTA: No thoracic aortic aneurysm or dissection. Thoracic aortic atherosclerosis. No abdominal aortic aneurysm or dissection. Atherosclerotic calcifications of the abdominal aorta and branch vessels, although patent. PULMONARY ARTERIES: No central pulmonary embolism with the limits of this exam. GREAT VESSELS OF AORTIC ARCH: No acute finding. No  dissection. No arterial occlusion or significant stenosis. CELIAC TRUNK: No acute finding. No occlusion or significant stenosis. SUPERIOR MESENTERIC ARTERY: No acute finding. No occlusion or significant stenosis. INFERIOR MESENTERIC ARTERY: No acute finding. No occlusion or significant stenosis. RENAL ARTERIES: No acute finding. No occlusion or significant stenosis. ILIAC ARTERIES: No acute finding. No occlusion or significant stenosis. CHEST: MEDIASTINUM: No mediastinal lymphadenopathy. Mild cardiomegaly. Left atrial appendage occluder. Mild coronary atherosclerosis of the LAD and right coronary artery. The pericardium demonstrates no acute abnormality. LUNGS AND PLEURA: Multifocal subpleural patchy opacities, right upper lobe predominant, suggesting multifocal pneumonia although underlying post-infectious/inflammatory scarring is possible. Biapical pleural parenchymal scarring. Trace right pleural effusion. No pneumothorax. THORACIC BONES AND SOFT TISSUES: Mild degenerative changes of the lower thoracic spine. No acute soft tissue abnormality. ABDOMEN AND PELVIS: LIVER: The liver is unremarkable. GALLBLADDER AND BILE DUCTS: Gallbladder is unremarkable. No biliary ductal dilatation. SPLEEN: The spleen is unremarkable. PANCREAS: The pancreas is unremarkable. ADRENAL GLANDS: Bilateral adrenal glands demonstrate no acute abnormality. KIDNEYS, URETERS AND BLADDER: 4 mm nonobstructing right lower pole renal calculus (image 130). Bilateral renal cysts, measuring up to 3.5 cm in the left upper kidney, benign (Bosniak 1). No follow-up is recommended. No hydronephrosis. No perinephric or periureteral stranding. Urinary bladder is unremarkable. GI AND BOWEL: Stomach and duodenal sweep demonstrate no acute abnormality. Sigmoid diverticulosis, without evidence of diverticulitis. There is no bowel obstruction. No abnormal bowel wall thickening or distension. REPRODUCTIVE: Diminutive prostate. PERITONEUM AND RETROPERITONEUM: No  ascites or free air. LYMPH NODES: No lymphadenopathy. ABDOMINAL BONES AND SOFT TISSUES: Mild degenerative changes of the upper lumbar spine. Mild lumbar levoscoliosis. No acute soft tissue abnormality. IMPRESSION: 1. No thoracoabdominal aortic aneurysm or dissection. 2. No central pulmonary embolism. 3. Suspected multifocal pneumonia, right upper lobe predominant. Underlying postinfectious/inflammatory scarring is possible. 4. Trace right pleural effusion. 5. Additional ancillary findings, as above. Electronically signed by: Pinkie Pebbles MD 03/04/2024 08:00 PM EST RP Workstation: [id number]   CT HEAD WO CONTRAST Result Date: 03/04/2024 EXAM: CT HEAD WITHOUT CONTRAST 03/04/2024 07:48:00 PM TECHNIQUE: CT of the head was performed without the administration of intravenous contrast. Automated exposure control, iterative reconstruction, and/or weight based adjustment of the mA/kV was utilized to reduce the radiation dose to as low as reasonably achievable. COMPARISON: 12/13/2023 CLINICAL HISTORY: Head trauma, moderate-severe.  FINDINGS: BRAIN AND VENTRICLES: No acute hemorrhage. No evidence of acute infarct. No hydrocephalus. No extra-axial collection. No mass effect or midline shift. Stable atrophy. Periventricular and deep white matter hypodensity typical of chronic small vessel ischemia. Unchanged chronic gyriform mineralized densities in the occipital lobes. 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 Intracranial atherosclerosis. ORBITS: No acute abnormality. SINUSES: No acute abnormality. SOFT TISSUES AND SKULL: No acute soft tissue abnormality. No skull fracture. IMPRESSION: 1. No acute intracranial abnormality. 2. Prior infarcts and additional stable ancillary findings, as above. Electronically signed by: Pinkie Pebbles MD 03/04/2024 07:55 PM EST RP Workstation: [id number]   DG Pelvis Portable Result Date: 03/04/2024 EXAM: 1 VIEW(S) XRAY OF THE PELVIS 03/04/2024 07:04:56 PM  COMPARISON: None available. CLINICAL HISTORY: Trauma FINDINGS: BONES AND JOINTS: No acute fracture. No focal osseous lesion. No joint dislocation. Mild degenerative changes at L4-L5. SOFT TISSUES: The soft tissues are unremarkable. IMPRESSION: 1. No evidence of acute traumatic injury. Electronically signed by: Pinkie Pebbles MD 03/04/2024 07:11 PM EST RP Workstation: [id number]   DG Chest Port 1 View Result Date: 03/04/2024 EXAM: 1 VIEW(S) XRAY OF THE CHEST 03/04/2024 07:04:56 PM COMPARISON: 12/26/2021. CLINICAL HISTORY: Trauma FINDINGS: LINES, TUBES AND DEVICES: Cardiac device overlying the left upper heart. LUNGS AND PLEURA: Calcified left upper lobe granuloma. Multifocal patchy opacities in the bilateral upper lobes, suspicious for pneumonia. Mild subpleural scarring at the lung bases. Biapical pleural parenchymal scarring. No pulmonary edema. No pleural effusion. No pneumothorax. HEART AND MEDIASTINUM: No acute abnormality of the cardiac and mediastinal silhouettes. BONES AND SOFT TISSUES: No acute osseous abnormality. IMPRESSION: 1. Multifocal patchy opacities in the bilateral upper lobes, suspicious for pneumonia. Electronically signed by: Pinkie Pebbles MD 03/04/2024 07:10 PM EST RP Workstation: [id number]   _______________________________________________________________________________________________________ Latest  Blood pressure 136/72, pulse 90, temperature 98.5 F (36.9 C), resp. rate 13, height 6' 3 (1.905 m), weight 74.8 kg, SpO2 98%.   Vitals  labs and radiology finding personally reviewed  Review of Systems:    Pertinent positives include:   Fevers, chills, fatigue,  Constitutional:  No weight loss, night sweats, weight loss  HEENT:  No headaches, Difficulty swallowing,Tooth/dental problems,Sore throat,  No sneezing, itching, ear ache, nasal congestion, post nasal drip,  Cardio-vascular:  No chest pain, Orthopnea, PND, anasarca, dizziness, palpitations.no Bilateral lower  extremity swelling  GI:  No heartburn, indigestion, abdominal pain, nausea, vomiting, diarrhea, change in bowel habits, loss of appetite, melena, blood in stool, hematemesis Resp:  no shortness of breath at rest. No dyspnea on exertion, No excess mucus, no productive cough, No non-productive cough, No coughing up of blood.No change in color of mucus.No wheezing. Skin:  no rash or lesions. No jaundice GU:  no dysuria, change in color of urine, no urgency or frequency. No straining to urinate.  No flank pain.  Musculoskeletal:  No joint pain or no joint swelling. No decreased range of motion. No back pain.  Psych:  No change in mood or affect. No depression or anxiety. No memory loss.  Neuro: no localizing neurological complaints, no tingling, no weakness, no double vision, no gait abnormality, no slurred speech, no confusion  All systems reviewed and apart from HOPI all are negative _______________________________________________________________________________________________ Past Medical History:   Past Medical History:  Diagnosis Date   Atrial fibrillation (HCC)    CVA (cerebral vascular accident) (HCC)    Hard of hearing       Past Surgical History:  Procedure Laterality Date   IR CT HEAD LTD  09/12/2021   IR CT HEAD LTD  09/12/2021   IR PERCUTANEOUS ART THROMBECTOMY/INFUSION INTRACRANIAL INC DIAG ANGIO  09/12/2021   RADIOLOGY WITH ANESTHESIA N/A 09/12/2021   Procedure: IR WITH ANESTHESIA;  Surgeon: Radiologist, Medication, MD;  Location: [facility] OR;  Service: Radiology;  Laterality: N/A;    Social History:  Ambulatory   walker        reports that he quit smoking about 69 years ago. His smoking use included cigarettes. He started smoking about 73 years ago. He has a 4 pack-year smoking history. He has never used smokeless tobacco. He reports that he does not currently use alcohol . He reports that he does not use drugs.   Family History:   Family History  Problem Relation Age  of Onset   Heart disease Sister    ______________________________________________________________________________________________ Allergies: No Known Allergies   Prior to Admission medications   Medication Sig Start Date End Date Taking? Authorizing Provider  acetaminophen  (TYLENOL ) 500 MG tablet Take 500 mg by mouth at bedtime.    [provider]  cholecalciferol (VITAMIN D3) 25 MCG (1000 UNIT) tablet Take 1,000 Units by mouth daily.    [provider]  Cyanocobalamin (B-12) 500 MCG TABS Take by mouth. Once daily    [provider]  cycloSPORINE  (RESTASIS ) 0.05 % ophthalmic emulsion Place 1 drop into both eyes 2 (two) times daily.    [provider]  ELIQUIS  2.5 MG TABS tablet TAKE 1 TABLET(2.5 MG) BY MOUTH TWICE DAILY 09/03/23   Sethi, Pramod S, MD  fluocinonide cream (LIDEX) 0.05 % Apply 1 application. topically 2 (two) times daily as needed (rash).    [provider]  fluticasone (FLONASE) 50 MCG/ACT nasal spray Place 1 spray into both nostrils daily.    [provider]  hydrOXYzine (ATARAX) 10 MG tablet Take 10 mg by mouth at bedtime. itching 05/24/23   [provider]  latanoprost  (XALATAN ) 0.005 % ophthalmic solution Place 1 drop into both eyes at bedtime.    [provider]  levocetirizine (XYZAL) 5 MG tablet Take 5 mg by mouth daily as needed for allergies.    [provider]  levothyroxine  (SYNTHROID ) 88 MCG tablet Take 88 mcg by mouth daily before breakfast.    [provider]  mupirocin ointment (BACTROBAN) 2 % Apply 1 application. topically 2 (two) times daily as needed (rash).    [provider]  omeprazole (PRILOSEC) 40 MG capsule Take 40 mg by mouth daily.    [provider]  polyethylene glycol powder (MIRALAX ) 17 GM/SCOOP powder Take 17 g by mouth daily.    [provider]  Polyvinyl Alcohol -Povidone (REFRESH OP) Place 1 drop into both eyes in the morning and at  bedtime.    [provider]  rosuvastatin  (CRESTOR ) 20 MG tablet Take 1 tablet (20 mg total) by mouth daily. 09/16/21   Remi Pippin, NP  sacubitril-valsartan (ENTRESTO) 24-26 MG Take 1 tablet by mouth at bedtime.    [provider]  Sodium Chloride , Hypertonic, (MURO 128 OP) Apply 1 drop to eye 2 (two) times daily.    [provider]    ___________________________________________________________________________________________________ Physical Exam:    03/04/2024    8:03 PM 03/04/2024    8:00 PM 03/04/2024    7:04 PM  Vitals with BMI  Height 6' 3    Weight 165 lbs    BMI 20.62    Systolic  136 153  Diastolic  72 98  Pulse  90  1. General:  in No  Acute distress   Chronically ill   -appearing 2. Psychological: Alert and   Oriented 3. Head/ENT:    Dry Mucous Membranes                          Head Non traumatic, neck supple                          Poor Dentition 4. SKIN:  decreased Skin turgor,  Skin clean Dry and intact no rash    5. Heart: rapid irRegular rate and rhythm no  Murmur, no Rub or gallop 6. Lungs:   no wheezes some  crackles   7. Abdomen: Soft,  non-tender, Non distended   bowel sounds present 8. Lower extremities: no clubbing, cyanosis, no  edema 9. Neurologically Grossly intact, moving all 4 extremities equally   10. MSK: Normal range of motion    Chart has been reviewed  ______________________________________________________________________________________________  Assessment/Plan 89 y.o. male with medical history significant of   a.fib on eliquis  status post Watchman device, hypothyroidism, HLD systolic CHF EF 25%    Admitted for  CAP   Present on Admission:  Acquired hypothyroidism  Atrial fibrillation (HCC)  CAP (community acquired pneumonia)  Chronic systolic CHF (congestive heart failure) (HCC)  Essential hypertension  Debility  Prolonged QT interval  Hyponatremia    Acquired hypothyroidism - Check TSH  continue home medications Synthroid  at 88 mcg po q day   Atrial fibrillation (HCC) Chronic stable continue Eliquis  2.5 mg p.o. twice daily Patient does not need rate control at this time  CAP (community acquired pneumonia)  - -Patient presenting with fever     and infiltrate in   lower lobe on chest x-ray -Infiltrate on CXR and 2-3 characteristics (fever, leukocytosis, purulent sputum) are consistent with pneumonia. -This appears to be most likely community-acquired pneumonia.      will admit for treatment of CAP will start on appropriate antibiotic coverage. - Rocephin doxy ( QTC prolongation)   Obtain:  sputum cultures,            Obtain respiratory panel                    influenza serologies                    COVID PCR   Pending but unlikely                 blood cultures and sputum cultures ordered                   strep pneumo UA antigen,                    check for Legionella antigen.                Provide oxygen as needed.    Chronic systolic CHF (congestive heart failure) (HCC) - currently appears to be slightly on the dry side, hold home diuretics for tonight and restart when appears euvolemic, carefuly follow fluid status and Cr Hold off on Entresto for right now given some soft blood pressures yesterday resume when able to tolerate  Essential hypertension Allow permissive hypertension for today  Debility Will need PT OT evaluation prior to discharge  Prolonged QT interval - will monitor on tele avoid QT prolonging medications, rehydrate correct electrolytes   Hyponatremia  Will gently rehydrate Order urine electrolytes   Other plan as per orders.  DVT prophylaxis:  SCD     Code Status:    DNR/DNI  as per patient   I had personally discussed CODE STATUS with patient and family  ACP   none   Family Communication:   Family  at  Bedside  plan of care was discussed   with   Son,    Diet  Diet Orders (From admission, onward)     Start     Ordered    03/04/24 1855  Diet NPO time specified  Diet effective now        03/04/24 1854            Disposition Plan:       To home once workup is complete and patient is stable   Following barriers for discharge:                                                                                   Afebrile, white count improving able to transition to PO antibiotics                       Would benefit from PT/OT eval prior to DC  Ordered                    Consults called:    NONE   Admission status:  ED Disposition     ED Disposition  Admit   Condition  --   Comment  Hospital Area: [facility] [[id number]]  Level of Care: Telemetry [5]  May admit patient to [facility] or [facility] if equivalent level of care is available:: Yes  Diagnosis: CAP (community acquired pneumonia) [659315]  Admitting Physician: Alakai Macbride [[id number]]  Attending Physician: Marielys Trinidad [[id number]]  Certification:: I certify this patient will need inpatient services for at least 2 midnights  Expected Medical Readiness: 03/06/2024            inpatient     I Expect 2 midnight stay secondary to severity of patient's current illness need for inpatient interventions justified by the following:     Severe lab/radiological/exam abnormalities including:   CAp and extensive comorbidities including:  CHF    *history of stroke with residual deficits   Chronic anticoagulation  That are currently affecting medical management.   I expect  patient to be hospitalized for 2 midnights requiring inpatient medical care.  Patient is at high risk for adverse outcome (such as loss of life or disability) if not treated.  Indication for inpatient stay as follows:    Need for IV antibiotics, IV fluids,,    Level of care     tele  For  24H      Alder Murri 03/04/2024, 9:47 PM    [facility]     after 2 AM please page floor coverage   If 7AM-7PM, please contact the day team  taking care of the patient using [email or web address]

## 2024-03-04 NOTE — Assessment & Plan Note (Signed)
 Chronic stable continue Eliquis  2.5 mg p.o. twice daily Patient does not need rate control at this time

## 2024-03-04 NOTE — Assessment & Plan Note (Signed)
-   Check TSH continue home medications Synthroid at 88mcg po q day  

## 2024-03-04 NOTE — Subjective & Objective (Addendum)
 Patient felt too weak to get off the commode  today and had a  fall hitting head while on eliquis  He tried to get up from sitting on the toilet felt weak and fell  He hit his head and back reported back right arm and leg pain  No fever no chills  No focal neurological complaints Has been weak all over

## 2024-03-04 NOTE — ED Triage Notes (Signed)
 BIB [facility] from home for FOT . Mechanical fall. Hit head on front side. No blood or obvious deformities. Eliquis  for Afib. C-collar in place. 172/90 60 HR 98 RA 126 CBG

## 2024-03-04 NOTE — Assessment & Plan Note (Addendum)
- -  Patient presenting with fever     and infiltrate in   lower lobe on chest x-ray -Infiltrate on CXR and 2-3 characteristics (fever, leukocytosis, purulent sputum) are consistent with pneumonia. -This appears to be most likely community-acquired pneumonia.      will admit for treatment of CAP will start on appropriate antibiotic coverage. - Rocephin doxy ( QTC prolongation)   Obtain:  sputum cultures,            Obtain respiratory panel                    influenza serologies                    COVID PCR   Pending but unlikely                 blood cultures and sputum cultures ordered                   strep pneumo UA antigen,                    check for Legionella antigen.                Provide oxygen as needed.

## 2024-03-04 NOTE — ED Notes (Signed)
 Trauma Response Nurse Documentation   Elijah Stone is a 89 y.o. male arriving to [facility] ED via EMS  On Eliquis  (apixaban ) daily. Trauma was activated as a Level 2 by ED charge RN based on the following trauma criteria Elderly patients > 65 with head trauma on anti-coagulation (excluding ASA).  Patient cleared for CT by Dr. Dreama. Pt transported to CT with trauma response nurse present to monitor. RN remained with the patient throughout their absence from the department for clinical observation.   GCS 15.  History   Past Medical History:  Diagnosis Date   Atrial fibrillation (HCC)    CVA (cerebral vascular accident) (HCC)    Hard of hearing      Past Surgical History:  Procedure Laterality Date   IR CT HEAD LTD  09/12/2021   IR CT HEAD LTD  09/12/2021   IR PERCUTANEOUS ART THROMBECTOMY/INFUSION INTRACRANIAL INC DIAG ANGIO  09/12/2021   RADIOLOGY WITH ANESTHESIA N/A 09/12/2021   Procedure: IR WITH ANESTHESIA;  Surgeon: Radiologist, Medication, MD;  Location: [facility] OR;  Service: Radiology;  Laterality: N/A;       Initial Focused Assessment (If applicable, or please see trauma documentation): Alert/oriented male presents from home after a ground level fall forward. No obvious trauma noted, complains of left upper abdominal pain. Abrasion noted to R hip.  Airway patent, BS clear  No obvious uncontrolled hemorrhage GCS 15 PERRLA 3  CT's Completed:   CT Head, CT C-Spine, CT Chest w/ contrast, and CT abdomen/pelvis w/ contrast  CT dissection chest, T/L spine  Interventions:  Trauma lab draw Portable chest and pelvis XRAY CT head, C/T/L spine, dissection chest abd pelvis Urinalysis  Plan for disposition:  Admission to floor   Consults completed:  Hospitalist Doutova paged at 2049.  Event Summary: Presents via EMS after a ground level fall forward, on eliquis , reports left upper quad abd pain. R hip abrasion. Complains of lower extremity weakness, neuro check with normal  strength and sensation. Trauma scans with multifocal PNA.    Bedside handoff with ED RN Dozier.    Ahtziry Saathoff O Myrtie Leuthold  Trauma Response RN  Please call TRN at [phone] for further assistance.

## 2024-03-04 NOTE — Assessment & Plan Note (Signed)
-   currently appears to be slightly on the dry side, hold home diuretics for tonight and restart when appears euvolemic, carefuly follow fluid status and Cr Hold off on Entresto for right now given some soft blood pressures yesterday resume when able to tolerate

## 2024-03-04 NOTE — Assessment & Plan Note (Signed)
 Allow permissive hypertension for today ?

## 2024-03-04 NOTE — ED Provider Notes (Signed)
 [place] EMERGENCY DEPARTMENT AT [facility] Provider Note   CSN: [id number] Arrival date & time: 03/04/24  1849     Patient presents with: Elijah Stone is a 89 y.o. male.  {Add pertinent medical, surgical, social history, OB history to HPI:32947} HPI     89 year old male with a history of hypothyroidism, hyperlipidemia, atrial fibrillation on Eliquis , CVA, CHF, MDS, hard of hearing who presents with concern for sudden difficulty walking resulting in fall and upper back pain.    Reports that he was on the commode tonight and went to stand up, and felt weak all over and when unable to do so.  This resulted in him falling forwards onto his right side and hitting his head.  He came into the emergency department as a level 2 trauma for a fall on blood thinners.  He reports that now he has pain between his shoulder blades in his upper back.  He reports he has chronic low back pain and right hip pain but it is somewhat worse.  Has some pain in his right arm but does not feel like it feels broken, feels it is more bruised.  Denies any headache, nausea, vomiting, numbness, focal weakness, trouble talking, change in vision.  He reports he just feels very weak all over and was unable to walk and unable to get up on his own.  Prior to Admission medications   Medication Sig Start Date End Date Taking? Authorizing Provider  acetaminophen  (TYLENOL ) 500 MG tablet Take 500 mg by mouth at bedtime.    [provider]  cholecalciferol (VITAMIN D3) 25 MCG (1000 UNIT) tablet Take 1,000 Units by mouth daily.    [provider]  Cyanocobalamin (B-12) 500 MCG TABS Take by mouth. Once daily    [provider]  cycloSPORINE  (RESTASIS ) 0.05 % ophthalmic emulsion Place 1 drop into both eyes 2 (two) times daily.    [provider]  ELIQUIS  2.5 MG TABS tablet TAKE 1 TABLET(2.5 MG) BY MOUTH TWICE DAILY 09/03/23   Sethi, Pramod S, MD  fluocinonide cream (LIDEX)  0.05 % Apply 1 application. topically 2 (two) times daily as needed (rash).    [provider]  fluticasone (FLONASE) 50 MCG/ACT nasal spray Place 1 spray into both nostrils daily.    [provider]  hydrOXYzine (ATARAX) 10 MG tablet Take 10 mg by mouth at bedtime. itching 05/24/23   [provider]  latanoprost  (XALATAN ) 0.005 % ophthalmic solution Place 1 drop into both eyes at bedtime.    [provider]  levocetirizine (XYZAL) 5 MG tablet Take 5 mg by mouth daily as needed for allergies.    [provider]  levothyroxine  (SYNTHROID ) 88 MCG tablet Take 88 mcg by mouth daily before breakfast.    [provider]  mupirocin ointment (BACTROBAN) 2 % Apply 1 application. topically 2 (two) times daily as needed (rash).    [provider]  omeprazole (PRILOSEC) 40 MG capsule Take 40 mg by mouth daily.    [provider]  polyethylene glycol powder (MIRALAX ) 17 GM/SCOOP powder Take 17 g by mouth daily.    [provider]  Polyvinyl Alcohol -Povidone (REFRESH OP) Place 1 drop into both eyes in the morning and at bedtime.    [provider]  rosuvastatin  (CRESTOR ) 20 MG tablet Take 1 tablet (20 mg total) by mouth daily. 09/16/21   Remi Pippin, NP  sacubitril-valsartan (ENTRESTO) 24-26 MG Take 1 tablet by mouth at bedtime.  [provider]  Sodium Chloride , Hypertonic, (MURO 128 OP) Apply 1 drop to eye 2 (two) times daily.    [provider]    Allergies: Patient has no known allergies.    Review of Systems  Updated Vital Signs BP 136/72   Pulse 90   Temp 98.5 F (36.9 C)   Resp 13   Ht 6' 3 (1.905 m)   Wt 74.8 kg   SpO2 98%   BMI 20.62 kg/m   Physical Exam  (all labs ordered are listed, but only abnormal results are displayed) Labs Reviewed  COMPREHENSIVE METABOLIC PANEL WITH GFR - Abnormal; Notable for the following components:      Result Value   Sodium 131 (*)    Chloride  95 (*)    Glucose, Bld 120 (*)    Albumin 3.3 (*)    Alkaline Phosphatase 314 (*)    All other components within normal limits  CBC - Abnormal; Notable for the following components:   RBC 3.70 (*)    Hemoglobin 11.4 (*)    HCT 35.0 (*)    All other components within normal limits  PROTIME-INR - Abnormal; Notable for the following components:   Prothrombin Time 15.8 (*)    All other components within normal limits  APTT - Abnormal; Notable for the following components:   aPTT 42 (*)    All other components within normal limits  I-STAT CHEM 8, ED - Abnormal; Notable for the following components:   Sodium 133 (*)    Chloride 97 (*)    Glucose, Bld 114 (*)    Hemoglobin 12.9 (*)    HCT 38.0 (*)    All other components within normal limits  URINALYSIS, ROUTINE W REFLEX MICROSCOPIC  I-STAT CG4 LACTIC ACID, ED  SAMPLE TO BLOOD BANK    EKG: None  Radiology: CT L-SPINE NO CHARGE Result Date: 03/04/2024 EXAM: CT OF THE LUMBAR SPINE WITHOUT CONTRAST 03/04/2024 07:48:00 PM TECHNIQUE: CT of the lumbar spine was performed without the administration of intravenous contrast. Multiplanar reformatted images are provided for review. Automated exposure control, iterative reconstruction, and/or weight based adjustment of the mA/kV was utilized to reduce the radiation dose to as low as reasonably achievable. COMPARISON: None available. CLINICAL HISTORY: FINDINGS: BONES AND ALIGNMENT: Normal vertebral body heights. No acute fracture or suspicious bone lesion. Mild lumbar levoscoliosis. DEGENERATIVE CHANGES: Mild multilevel degenerative changes. SOFT TISSUES: No acute abnormality. IMPRESSION: 1. No acute findings. Electronically signed by: Pinkie Pebbles MD 03/04/2024 08:03 PM EST RP Workstation: [id number]   CT T-SPINE NO CHARGE Result Date: 03/04/2024 EXAM: CT THORACIC SPINE WITHOUT CONTRAST 03/04/2024 07:48:00 PM TECHNIQUE: CT of the thoracic spine was performed without the administration of  intravenous contrast. Multiplanar reformatted images are provided for review. Automated exposure control, iterative reconstruction, and/or weight based adjustment of the mA/kV was utilized to reduce the radiation dose to as low as reasonably achievable. COMPARISON: None available. CLINICAL HISTORY: FINDINGS: BONES AND ALIGNMENT: Normal vertebral body heights. No acute fracture or suspicious bone lesion. Normal alignment. DEGENERATIVE CHANGES: Mild degenerative changes of the lower thoracic spine. SOFT TISSUES: No acute abnormality. IMPRESSION: 1. No acute abnormality of the thoracic spine. Electronically signed by: Pinkie Pebbles MD 03/04/2024 08:02 PM EST RP Workstation: [id number]   CT Cervical Spine Wo Contrast Result Date: 03/04/2024 EXAM: CT CERVICAL SPINE WITHOUT CONTRAST 03/04/2024 07:48:00 PM TECHNIQUE: CT of the cervical spine was performed without the administration of intravenous contrast. Multiplanar reformatted images are provided for review. Automated exposure  control, iterative reconstruction, and/or weight based adjustment of the mA/kV was utilized to reduce the radiation dose to as low as reasonably achievable. COMPARISON: None available. CLINICAL HISTORY: Ataxia, cervical trauma. FINDINGS: CERVICAL SPINE: BONES AND ALIGNMENT: No acute fracture or traumatic malalignment. DEGENERATIVE CHANGES: Mild degenerative changes of the mid cervical spine. SOFT TISSUES: No prevertebral soft tissue swelling. IMPRESSION: 1. No acute abnormality of the cervical spine. Electronically signed by: Pinkie Pebbles MD 03/04/2024 08:01 PM EST RP Workstation: [id number]   CT Angio Chest/Abd/Pel for Dissection W and/or Wo Contrast Result Date: 03/04/2024 EXAM: CTA CHEST, ABDOMEN AND PELVIS WITHOUT AND WITH CONTRAST 03/04/2024 07:48:00 PM TECHNIQUE: CTA of the chest was performed without and with the administration of 85 mL of intravenous iohexol  (OMNIPAQUE ) 350 MG/ML injection. CTA of the abdomen and pelvis was  performed without and with the administration of 85 mL of intravenous iohexol  (OMNIPAQUE ) 350 MG/ML injection. Multiplanar reformatted images are provided for review. MIP images are provided for review. Automated exposure control, iterative reconstruction, and/or weight based adjustment of the mA/kV was utilized to reduce the radiation dose to as low as reasonably achievable. COMPARISON: None available. CLINICAL HISTORY: Acute aortic syndrome (AAS) suspected. FINDINGS: VASCULATURE: AORTA: No thoracic aortic aneurysm or dissection. Thoracic aortic atherosclerosis. No abdominal aortic aneurysm or dissection. Atherosclerotic calcifications of the abdominal aorta and branch vessels, although patent. PULMONARY ARTERIES: No central pulmonary embolism with the limits of this exam. GREAT VESSELS OF AORTIC ARCH: No acute finding. No dissection. No arterial occlusion or significant stenosis. CELIAC TRUNK: No acute finding. No occlusion or significant stenosis. SUPERIOR MESENTERIC ARTERY: No acute finding. No occlusion or significant stenosis. INFERIOR MESENTERIC ARTERY: No acute finding. No occlusion or significant stenosis. RENAL ARTERIES: No acute finding. No occlusion or significant stenosis. ILIAC ARTERIES: No acute finding. No occlusion or significant stenosis. CHEST: MEDIASTINUM: No mediastinal lymphadenopathy. Mild cardiomegaly. Left atrial appendage occluder. Mild coronary atherosclerosis of the LAD and right coronary artery. The pericardium demonstrates no acute abnormality. LUNGS AND PLEURA: Multifocal subpleural patchy opacities, right upper lobe predominant, suggesting multifocal pneumonia although underlying post-infectious/inflammatory scarring is possible. Biapical pleural parenchymal scarring. Trace right pleural effusion. No pneumothorax. THORACIC BONES AND SOFT TISSUES: Mild degenerative changes of the lower thoracic spine. No acute soft tissue abnormality. ABDOMEN AND PELVIS: LIVER: The liver is  unremarkable. GALLBLADDER AND BILE DUCTS: Gallbladder is unremarkable. No biliary ductal dilatation. SPLEEN: The spleen is unremarkable. PANCREAS: The pancreas is unremarkable. ADRENAL GLANDS: Bilateral adrenal glands demonstrate no acute abnormality. KIDNEYS, URETERS AND BLADDER: 4 mm nonobstructing right lower pole renal calculus (image 130). Bilateral renal cysts, measuring up to 3.5 cm in the left upper kidney, benign (Bosniak 1). No follow-up is recommended. No hydronephrosis. No perinephric or periureteral stranding. Urinary bladder is unremarkable. GI AND BOWEL: Stomach and duodenal sweep demonstrate no acute abnormality. Sigmoid diverticulosis, without evidence of diverticulitis. There is no bowel obstruction. No abnormal bowel wall thickening or distension. REPRODUCTIVE: Diminutive prostate. PERITONEUM AND RETROPERITONEUM: No ascites or free air. LYMPH NODES: No lymphadenopathy. ABDOMINAL BONES AND SOFT TISSUES: Mild degenerative changes of the upper lumbar spine. Mild lumbar levoscoliosis. No acute soft tissue abnormality. IMPRESSION: 1. No thoracoabdominal aortic aneurysm or dissection. 2. No central pulmonary embolism. 3. Suspected multifocal pneumonia, right upper lobe predominant. Underlying postinfectious/inflammatory scarring is possible. 4. Trace right pleural effusion. 5. Additional ancillary findings, as above. Electronically signed by: Pinkie Pebbles MD 03/04/2024 08:00 PM EST RP Workstation: [id number]   CT HEAD WO CONTRAST Result Date: 03/04/2024 EXAM: CT HEAD WITHOUT CONTRAST  03/04/2024 07:48:00 PM TECHNIQUE: CT of the head was performed without the administration of intravenous contrast. Automated exposure control, iterative reconstruction, and/or weight based adjustment of the mA/kV was utilized to reduce the radiation dose to as low as reasonably achievable. COMPARISON: 12/13/2023 CLINICAL HISTORY: Head trauma, moderate-severe. FINDINGS: BRAIN AND VENTRICLES: No acute hemorrhage. No  evidence of acute infarct. No hydrocephalus. No extra-axial collection. No mass effect or midline shift. Stable atrophy. Periventricular and deep white matter hypodensity typical of chronic small vessel ischemia. Unchanged chronic gyriform mineralized densities in the occipital lobes. 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 Intracranial atherosclerosis. ORBITS: No acute abnormality. SINUSES: No acute abnormality. SOFT TISSUES AND SKULL: No acute soft tissue abnormality. No skull fracture. IMPRESSION: 1. No acute intracranial abnormality. 2. Prior infarcts and additional stable ancillary findings, as above. Electronically signed by: Pinkie Pebbles MD 03/04/2024 07:55 PM EST RP Workstation: [id number]   DG Pelvis Portable Result Date: 03/04/2024 EXAM: 1 VIEW(S) XRAY OF THE PELVIS 03/04/2024 07:04:56 PM COMPARISON: None available. CLINICAL HISTORY: Trauma FINDINGS: BONES AND JOINTS: No acute fracture. No focal osseous lesion. No joint dislocation. Mild degenerative changes at L4-L5. SOFT TISSUES: The soft tissues are unremarkable. IMPRESSION: 1. No evidence of acute traumatic injury. Electronically signed by: Pinkie Pebbles MD 03/04/2024 07:11 PM EST RP Workstation: [id number]   DG Chest Port 1 View Result Date: 03/04/2024 EXAM: 1 VIEW(S) XRAY OF THE CHEST 03/04/2024 07:04:56 PM COMPARISON: 12/26/2021. CLINICAL HISTORY: Trauma FINDINGS: LINES, TUBES AND DEVICES: Cardiac device overlying the left upper heart. LUNGS AND PLEURA: Calcified left upper lobe granuloma. Multifocal patchy opacities in the bilateral upper lobes, suspicious for pneumonia. Mild subpleural scarring at the lung bases. Biapical pleural parenchymal scarring. No pulmonary edema. No pleural effusion. No pneumothorax. HEART AND MEDIASTINUM: No acute abnormality of the cardiac and mediastinal silhouettes. BONES AND SOFT TISSUES: No acute osseous abnormality. IMPRESSION: 1. Multifocal patchy opacities in the bilateral upper  lobes, suspicious for pneumonia. Electronically signed by: Pinkie Pebbles MD 03/04/2024 07:10 PM EST RP Workstation: [id number]    {Document cardiac monitor, telemetry assessment procedure when appropriate:32947} Procedures   Medications Ordered in the ED  iohexol  (OMNIPAQUE ) 350 MG/ML injection 85 mL (85 mLs Intravenous Contrast Given 03/04/24 1939)      {Click here for ABCD2, HEART and other calculators REFRESH Note before signing:1}                               89 year old male with a history of hypothyroidism, hyperlipidemia, atrial fibrillation on Eliquis , CVA, CHF, MDS, hard of hearing who presents with concern for sudden difficulty walking resulting in fall and upper back pain.   CTA completed shows no evidence of aneurysm or dissection, does show suspected multifocal pneumonia, right pleural effusion  CT head shows no acute intracranial abnormalities, stable prior infarcts.  CT cervical, thoracic and lumbar spine show no evidence of fracture.  Do not see evidence of traumatic injuries on CT head, cervical spine, chest abdomen pelvis.  Suspect his generalized weakness is likely secondary to pneumonia.   Do not see focal changes on exam to suggest CVA, and do not feel exam is consistent with an other spinal myelopathy or injury by exam.  Suspect his difficulty walking now is due to infectious source and generalized weakness    {Document critical care time when appropriate  Document review of labs and clinical decision tools ie CHADS2VASC2, etc  Document your independent review of  radiology images and any outside records  Document your discussion with family members, caretakers and with consultants  Document social determinants of health affecting pt's care  Document your decision making why or why not admission, treatments were needed:32947:::1}   Final diagnoses:  None    ED Discharge Orders     None

## 2024-03-04 NOTE — Assessment & Plan Note (Signed)
 Will need PT OT evaluation prior to discharge

## 2024-03-05 ENCOUNTER — Encounter (HOSPITAL_COMMUNITY): Payer: Self-pay | Admitting: Internal Medicine

## 2024-03-05 ENCOUNTER — Other Ambulatory Visit: Payer: Self-pay

## 2024-03-05 DIAGNOSIS — E440 Moderate protein-calorie malnutrition: Secondary | ICD-10-CM | POA: Insufficient documentation

## 2024-03-05 DIAGNOSIS — J189 Pneumonia, unspecified organism: Secondary | ICD-10-CM | POA: Diagnosis not present

## 2024-03-05 LAB — CBC
HCT: 31.2 % — ABNORMAL LOW (ref 39.0–52.0)
Hemoglobin: 10.4 g/dL — ABNORMAL LOW (ref 13.0–17.0)
MCH: 31.5 pg (ref 26.0–34.0)
MCHC: 33.3 g/dL (ref 30.0–36.0)
MCV: 94.5 fL (ref 80.0–100.0)
Platelets: 179 K/uL (ref 150–400)
RBC: 3.3 MIL/uL — ABNORMAL LOW (ref 4.22–5.81)
RDW: 14.8 % (ref 11.5–15.5)
WBC: 7.7 K/uL (ref 4.0–10.5)
nRBC: 0 % (ref 0.0–0.2)

## 2024-03-05 LAB — COMPREHENSIVE METABOLIC PANEL WITH GFR
ALT: 27 U/L (ref 0–44)
AST: 30 U/L (ref 15–41)
Albumin: 3 g/dL — ABNORMAL LOW (ref 3.5–5.0)
Alkaline Phosphatase: 268 U/L — ABNORMAL HIGH (ref 38–126)
Anion gap: 11 (ref 5–15)
BUN: 13 mg/dL (ref 8–23)
CO2: 24 mmol/L (ref 22–32)
Calcium: 8.8 mg/dL — ABNORMAL LOW (ref 8.9–10.3)
Chloride: 99 mmol/L (ref 98–111)
Creatinine, Ser: 0.93 mg/dL (ref 0.61–1.24)
GFR, Estimated: >60 mL/min (ref >60)
Glucose, Bld: 204 mg/dL — ABNORMAL HIGH (ref 70–99)
Potassium: 4.4 mmol/L (ref 3.5–5.1)
Sodium: 134 mmol/L — ABNORMAL LOW (ref 135–145)
Total Bilirubin: 0.7 mg/dL (ref 0.0–1.2)
Total Protein: 6.8 g/dL (ref 6.5–8.1)

## 2024-03-05 LAB — RESP PANEL BY RT-PCR (RSV, FLU A&B, COVID)  RVPGX2
Influenza A by PCR: NEGATIVE
Influenza B by PCR: NEGATIVE
Resp Syncytial Virus by PCR: NEGATIVE
SARS Coronavirus 2 by RT PCR: NEGATIVE

## 2024-03-05 LAB — MAGNESIUM: Magnesium: 2 mg/dL (ref 1.7–2.4)

## 2024-03-05 LAB — IRON AND TIBC
Iron: 19 ug/dL — ABNORMAL LOW (ref 45–182)
Saturation Ratios: 6 % — ABNORMAL LOW (ref 17.9–39.5)
TIBC: 325 ug/dL (ref 250–450)
UIBC: 306 ug/dL

## 2024-03-05 LAB — FERRITIN: Ferritin: 65 ng/mL (ref 24–336)

## 2024-03-05 LAB — TROPONIN I (HIGH SENSITIVITY): Troponin I (High Sensitivity): 17 ng/L (ref <18)

## 2024-03-05 LAB — STREP PNEUMONIAE URINARY ANTIGEN: Strep Pneumo Urinary Antigen: NEGATIVE

## 2024-03-05 LAB — FOLATE: Folate: >20 ng/mL (ref >5.9)

## 2024-03-05 LAB — SODIUM, URINE, RANDOM: Sodium, Ur: 34 mmol/L

## 2024-03-05 LAB — PHOSPHORUS: Phosphorus: 2.3 mg/dL — ABNORMAL LOW (ref 2.5–4.6)

## 2024-03-05 LAB — PREALBUMIN: Prealbumin: 7 mg/dL — ABNORMAL LOW (ref 18–38)

## 2024-03-05 LAB — VITAMIN B12: Vitamin B-12: 1130 pg/mL — ABNORMAL HIGH (ref 180–914)

## 2024-03-05 MED ORDER — VITAMIN D 25 MCG (1000 UNIT) PO TABS
1000.0000 [IU] | ORAL_TABLET | Freq: Every day | ORAL | Status: DC
  Administered 2024-03-05 – 2024-03-07 (×3): 1000 [IU] via ORAL
  Filled 2024-03-05 (×3): qty 1

## 2024-03-05 MED ORDER — ROSUVASTATIN CALCIUM 20 MG PO TABS
20.0000 mg | ORAL_TABLET | Freq: Every day | ORAL | Status: DC
  Administered 2024-03-05 – 2024-03-07 (×3): 20 mg via ORAL
  Filled 2024-03-05 (×3): qty 1

## 2024-03-05 MED ORDER — LORATADINE 10 MG PO TABS
10.0000 mg | ORAL_TABLET | Freq: Every evening | ORAL | Status: DC
  Administered 2024-03-05 – 2024-03-06 (×2): 10 mg via ORAL
  Filled 2024-03-05 (×2): qty 1

## 2024-03-05 MED ORDER — FERROUS SULFATE 325 (65 FE) MG PO TABS
325.0000 mg | ORAL_TABLET | Freq: Two times a day (BID) | ORAL | Status: DC
  Administered 2024-03-05 – 2024-03-07 (×5): 325 mg via ORAL
  Filled 2024-03-05 (×5): qty 1

## 2024-03-05 MED ORDER — SACUBITRIL-VALSARTAN 24-26 MG PO TABS
1.0000 | ORAL_TABLET | Freq: Every day | ORAL | Status: DC
  Administered 2024-03-05 – 2024-03-06 (×2): 1 via ORAL
  Filled 2024-03-05 (×3): qty 1

## 2024-03-05 MED ORDER — CYCLOSPORINE 0.05 % OP EMUL
1.0000 [drp] | Freq: Two times a day (BID) | OPHTHALMIC | Status: DC
  Administered 2024-03-05 – 2024-03-07 (×5): 1 [drp] via OPHTHALMIC
  Filled 2024-03-05 (×6): qty 30

## 2024-03-05 MED ORDER — POTASSIUM PHOSPHATES 15 MMOLE/5ML IV SOLN
30.0000 mmol | Freq: Once | INTRAVENOUS | Status: AC
  Administered 2024-03-05: 30 mmol via INTRAVENOUS
  Filled 2024-03-05: qty 10

## 2024-03-05 MED ORDER — ENSURE PLUS HIGH PROTEIN PO LIQD
237.0000 mL | Freq: Two times a day (BID) | ORAL | Status: DC
  Administered 2024-03-06 – 2024-03-07 (×2): 237 mL via ORAL

## 2024-03-05 MED ORDER — ORAL CARE MOUTH RINSE: 15.0000 mL | OROMUCOSAL | Status: DC | PRN

## 2024-03-05 NOTE — Evaluation (Signed)
 Occupational Therapy Evaluation Patient Details Name: Shonn Farruggia MRN: [id number] DOB: 03-27-1931 Today's Date: 03/05/2024   History of Present Illness   Maccoy Haubner is a 89 y.o. male who presented with a fall and generalized weakness; recently diagnosed with pneumonia;  with medical history significant of   a.fib on eliquis  status post Watchman device, hypothyroidism, HLD systolic CHF EF 25%     Clinical Impressions Pt reports ind at baseline with ADLs and uses RW for mobility. Pt currently with BUE pain and shoulder pain from fall, needs up to mod A for ADLs, and min A for transfers/ambulation in room with RW. Pt presenting with impairments listed below, will follow acutely. Patient will benefit from continued inpatient follow up therapy, <3 hours/day to maximize safety/ind with ADL/functional mobility.      If plan is discharge home, recommend the following:   A little help with walking and/or transfers;A lot of help with bathing/dressing/bathroom;Assistance with cooking/housework;Assist for transportation;Help with stairs or ramp for entrance     Functional Status Assessment   Patient has had a recent decline in their functional status and demonstrates the ability to make significant improvements in function in a reasonable and predictable amount of time.     Equipment Recommendations   None recommended by OT     Recommendations for Other Services   PT consult     Precautions/Restrictions   Precautions Precautions: Fall Restrictions Weight Bearing Restrictions Per Provider Order: No     Mobility Bed Mobility               General bed mobility comments: OOB in recliner upon arrival and departure    Transfers Overall transfer level: Needs assistance Equipment used: Rolling walker (2 wheels) Transfers: Sit to/from Stand Sit to Stand: Min assist                  Balance Overall balance assessment: Needs assistance, History of Falls    Sitting balance-Leahy Scale: Fair       Standing balance-Leahy Scale: Poor                             ADL either performed or assessed with clinical judgement   ADL Overall ADL's : Needs assistance/impaired Eating/Feeding: Set up;Sitting   Grooming: Set up   Upper Body Bathing: Minimal assistance;Sitting   Lower Body Bathing: Minimal assistance   Upper Body Dressing : Moderate assistance   Lower Body Dressing: Minimal assistance   Toilet Transfer: Minimal assistance           Functional mobility during ADLs: Minimal assistance;Rolling walker (2 wheels)       Vision   Additional Comments: will further assess, able to see toothbrush/toothpaste functionally during BADL     Perception Perception: Not tested       Praxis Praxis: Not tested       Pertinent Vitals/Pain Pain Assessment Pain Assessment: Faces Pain Score: 7  Faces Pain Scale: Hurts whole lot Pain Location: Sharp pain L ribs and back L just lateral to spine Pain Descriptors / Indicators: Sharp, Grimacing, Guarding Pain Intervention(s): Monitored during session, Limited activity within patient's tolerance, Repositioned     Extremity/Trunk Assessment Upper Extremity Assessment Upper Extremity Assessment: Generalized weakness;Right hand dominant;RUE deficits/detail;LUE deficits/detail RUE Deficits / Details: limited shoulder flexion due to pain, can flex shoulder ~30* RUE Coordination: decreased gross motor;decreased fine motor LUE Deficits / Details: painful, shoulder ROM ~80* LUE Coordination: decreased fine motor;decreased gross  motor   Lower Extremity Assessment Lower Extremity Assessment: Defer to PT evaluation   Cervical / Trunk Assessment Cervical / Trunk Assessment: Kyphotic   Communication Communication Communication: Impaired Factors Affecting Communication: Hearing impaired   Cognition Arousal: Alert Behavior During Therapy: WFL for tasks assessed/performed                OT - Cognition Comments: recalls falling as reason for admission, following single step commands with incr time, not formally assessed                 Following commands: Intact       Cueing  General Comments   Cueing Techniques: Verbal cues;Gestural cues;Tactile cues  VSS on RA   Exercises     Shoulder Instructions      Home Living Family/patient expects to be discharged to:: Private residence Living Arrangements: Spouse/significant other (wife) Available Help at Discharge: Family;Available PRN/intermittently Type of Home: House Home Access: Stairs to enter [employer] of Steps: 1   Home Layout: One level     Bathroom Shower/Tub: Producer, Television/film/video: Handicapped height Bathroom Accessibility: Yes   Home Equipment: [employer] Consultant (2 wheels);Cane - single point;Grab bars - toilet;Grab bars - tub/shower;Shower seat;Wheelchair - manual          Prior Functioning/Environment Prior Level of Function : Independent/Modified Independent             Mobility Comments: RW ADLs Comments: ind with ADLs, manages own meds with assist from spouse.    OT Problem List: Decreased strength;Decreased range of motion;Decreased activity tolerance;Impaired balance (sitting and/or standing);Decreased cognition;Decreased safety awareness;Cardiopulmonary status limiting activity;Impaired UE functional use   OT Treatment/Interventions: Self-care/ADL training;Therapeutic exercise;Energy conservation;DME and/or AE instruction;Therapeutic activities;Patient/family education;Balance training;Cognitive remediation/compensation;Visual/perceptual remediation/compensation      OT Goals(Current goals can be found in the care plan section)   Acute Rehab OT Goals Patient Stated Goal: none stated OT Goal Formulation: With patient/family Time For Goal Achievement: 03/19/24 Potential to Achieve Goals: Good ADL Goals Pt Will Perform Grooming: with  modified independence;standing Pt Will Perform Upper Body Dressing: with supervision;sitting Pt Will Perform Lower Body Dressing: with supervision;sit to/from stand;sitting/lateral leans Pt/caregiver will Perform Home Exercise Program: Increased ROM;Increased strength;Both right and left upper extremity;With Supervision Additional ADL Goal #1: pt will tolerate OOB standing activity x10 min in  order to improve balance/activity tolerance for ADLs   OT Frequency:  Min 2X/week    Co-evaluation              AM-PAC OT 6 Clicks Daily Activity     Outcome Measure Help from another person eating meals?: A Little Help from another person taking care of personal grooming?: A Little Help from another person toileting, which includes using toliet, bedpan, or urinal?: A Little Help from another person bathing (including washing, rinsing, drying)?: A Little Help from another person to put on and taking off regular upper body clothing?: A Lot Help from another person to put on and taking off regular lower body clothing?: A Little 6 Click Score: 17   End of Session Equipment Utilized During Treatment: Rolling walker (2 wheels);Gait belt Nurse Communication: Mobility status  Activity Tolerance: Patient tolerated treatment well Patient left: in chair;with call bell/phone within reach;with chair alarm set;with family/visitor present  OT Visit Diagnosis: Unsteadiness on feet (R26.81);Other abnormalities of gait and mobility (R26.89);Muscle weakness (generalized) (M62.81)                Time: 8886-8860 OT Time Calculation (  min): 26 min Charges:  OT General Charges $OT Visit: 1 Visit OT Evaluation $OT Eval Moderate Complexity: 1 Mod OT Treatments $Self Care/Home Management : 8-22 mins  Jizelle Conkey K, OTD, OTR/L SecureChat Preferred Acute Rehab [phone]   Laneta POUR Koonce 03/05/2024, 12:55 PM

## 2024-03-05 NOTE — Plan of Care (Signed)
   Problem: Activity: Goal: Risk for activity intolerance will decrease Outcome: Progressing   Problem: Nutrition: Goal: Adequate nutrition will be maintained Outcome: Progressing

## 2024-03-05 NOTE — Evaluation (Signed)
 Physical Therapy Evaluation Patient Details Name: Elijah Stone MRN: [id number] DOB: [birth year] Today's Date: 03/05/2024  History of Present Illness  Elijah Stone is a 89 y.o. male who presented with a fall and generalized weakness; recently diagnosed with pneumonia;  with medical history significant of   a.fib on eliquis  status post Watchman device, hypothyroidism, HLD systolic CHF EF 25%  Clinical Impression   Pt admitted with above diagnosis. Lives at home with wife, in a single-level home with one steps to enter; Prior to admission, pt was able to walk household distances with RW, no need for assist with bathing/dressing; Presents to PT with generalized weakness, decr functional capacity, L rib and back pain limiting activity tolerance; Needs up to Mod assist with hallway ambulation with RW; most painful with bed mobility and sit to stand transfers; Still, responded well to increasing activity, and seemed to enjoy walking and sitting out of bed; Anticipate good progress in post-acute rehab;  Pt currently with functional limitations due to the deficits listed below (see PT Problem List). Pt will benefit from skilled PT to increase their independence and safety with mobility to allow discharge to the venue listed below.           If plan is discharge home, recommend the following: A lot of help with walking and/or transfers;A lot of help with bathing/dressing/bathroom;Assistance with cooking/housework;Assist for transportation   Can travel by private vehicle    (Perhaps soon)    Equipment Recommendations Other (comment) (Can consider rollator, grab bars for bathroom)  Recommendations for Other Services       Functional Status Assessment Patient has had a recent decline in their functional status and demonstrates the ability to make significant improvements in function in a reasonable and predictable amount of time.     Precautions / Restrictions Precautions Precautions: Fall Recall of  Precautions/Restrictions:  (Will need more assessment of memory) Restrictions Weight Bearing Restrictions Per Provider Order: No      Mobility  Bed Mobility Overal bed mobility: Needs Assistance Bed Mobility: Supine to Sit     Supine to sit: Mod assist     General bed mobility comments: Light mod assist to pull to sit and square off hips at EOB    Transfers Overall transfer level: Needs assistance Equipment used: Rolling walker (2 wheels) Transfers: Sit to/from Stand Sit to Stand: Mod assist           General transfer comment: Light mod assist ot rise and steay    Ambulation/Gait Ambulation/Gait assistance: Min assist, Mod assist Gait Distance (Feet): 60 Feet Assistive device: Rolling walker (2 wheels) Gait Pattern/deviations: Step-through pattern, Decreased step length - right, Decreased step length - left       General Gait Details: Cues for posture, keeping RW close, and min with occasional mod assist for RW management  Stairs            Wheelchair Mobility     Tilt Bed    Modified Rankin (Stroke Patients Only)       Balance Overall balance assessment: Needs assistance, History of Falls   Sitting balance-Leahy Scale: Fair       Standing balance-Leahy Scale: Poor Standing balance comment: Dependent on UE support                             Pertinent Vitals/Pain Pain Assessment Pain Assessment: 0-10 Pain Score: 7  Pain Location: Sharp pain L ribs and back L just  lateral to spine Pain Descriptors / Indicators: Sharp, Grimacing, Guarding Pain Intervention(s): Monitored during session, Utilized relaxation techniques, Other (comment) (Used pillow splinting)    Home Living Family/patient expects to be discharged to:: Private residence Living Arrangements: Spouse/significant other (wife) Available Help at Discharge: Family;Available PRN/intermittently Type of Home: House Home Access: Stairs to enter   Entrance Stairs-Number of  Steps: 1   Home Layout: One level Home Equipment: Agricultural Consultant (2 wheels);Cane - single point;Grab bars - toilet;Grab bars - tub/shower;Shower seat;Wheelchair - manual      Prior Function Prior Level of Function : Independent/Modified Independent             Mobility Comments: RW ADLs Comments: ind with ADLs, manages own meds with assist from spouse.     Extremity/Trunk Assessment   Upper Extremity Assessment Upper Extremity Assessment: Defer to OT evaluation (Notable pain with LUE active shoulder flexion/motion)    Lower Extremity Assessment Lower Extremity Assessment: Generalized weakness       Communication   Communication Communication: Impaired Factors Affecting Communication: Hearing impaired    Cognition Arousal: Alert Behavior During Therapy: WFL for tasks assessed/performed                           PT - Cognition Comments: Worth taking a closer look at cognitive function Following commands: Intact       Cueing Cueing Techniques: Verbal cues, Gestural cues, Tactile cues     General Comments General comments (skin integrity, edema, etc.): Session was conducted on Room Air and O2 sats remained greater than or equal to 92% throughtout session    Exercises     Assessment/Plan    PT Assessment Patient needs continued PT services  PT Problem List Decreased strength;Decreased range of motion;Decreased activity tolerance;Decreased balance;Decreased mobility;Decreased coordination;Decreased cognition;Decreased knowledge of use of DME;Decreased safety awareness;Decreased knowledge of precautions;Cardiopulmonary status limiting activity;Pain       PT Treatment Interventions DME instruction;Gait training;Stair training;Functional mobility training;Therapeutic activities;Therapeutic exercise;Balance training;Neuromuscular re-education;Cognitive remediation;Patient/family education;Wheelchair mobility training;Manual techniques;Modalities    PT  Goals (Current goals can be found in the Care Plan section)  Acute Rehab PT Goals Patient Stated Goal: Get back to walking without worrying about falling PT Goal Formulation: With patient/family Time For Goal Achievement: 03/19/24 Potential to Achieve Goals: Good    Frequency Min 2X/week     Co-evaluation               AM-PAC PT 6 Clicks Mobility  Outcome Measure Help needed turning from your back to your side while in a flat bed without using bedrails?: A Little Help needed moving from lying on your back to sitting on the side of a flat bed without using bedrails?: A Lot Help needed moving to and from a bed to a chair (including a wheelchair)?: A Little Help needed standing up from a chair using your arms (e.g., wheelchair or bedside chair)?: A Lot Help needed to walk in hospital room?: A Lot Help needed climbing 3-5 steps with a railing? : A Lot 6 Click Score: 14    End of Session Equipment Utilized During Treatment: Gait belt Activity Tolerance: Patient tolerated treatment well Patient left: in chair;with call bell/phone within reach;with chair alarm set;with family/visitor present Nurse Communication: Mobility status PT Visit Diagnosis: Unsteadiness on feet (R26.81);Other abnormalities of gait and mobility (R26.89);History of falling (Z91.81);Pain Pain - Right/Left: Left Pain - part of body:  (mid rib and back (just lateral to spine))  Time: 9080-8996 PT Time Calculation (min) (ACUTE ONLY): 44 min   Charges:   PT Evaluation $PT Eval Moderate Complexity: 1 Mod PT Treatments $Gait Training: 8-22 mins $Therapeutic Activity: 8-22 mins PT General Charges $$ ACUTE PT VISIT: 1 Visit         Silvano Currier, PT  Acute Rehabilitation Services Office [phone] Secure Chat welcomed   Silvano VEAR Currier 03/05/2024, 12:08 PM

## 2024-03-05 NOTE — Progress Notes (Addendum)
 Initial Nutrition Assessment  DOCUMENTATION CODES:   Non-severe (moderate) malnutrition in context of chronic illness  INTERVENTION:  Ensure Plus High Protein PO BID. Each supplement provides 350 Kcals and 20 grams of protein. Continue heart healthy diet.   NUTRITION DIAGNOSIS:   Moderate Malnutrition related to chronic illness as evidenced by mild fat depletion, severe muscle depletion.   GOAL:   Patient will meet greater than or equal to 90% of their needs   MONITOR:   PO intake, Supplement acceptance, Labs, Weight trends  REASON FOR ASSESSMENT:   Consult Assessment of nutrition requirement/status  ASSESSMENT:   Patient presented with weakness s/p fall getting off commode and struck his head and was found to have PNA. PMH significant for recent 3-tooth extraction 02/29/24, recent diagnosis of liver disease with pruritus CHF, Afib s/p Watchman device, CVA s/p intracranial thrombectomy 08/2021, dyslipidemia, heard of hearing.  Visited the patient who is extremely hard of hearing and attempts to read lips. His son Charlena is at bedside and has helped answer some questions. The patient states he was eating well and has maintained his UBW of 165 lbs since the day he was married over 70 years ago. He had to start eating softer foods when he had his teeth extraction surgery a few days ago but denies any decreased appetite or intake. He uses a walker to ambulate at home and is quite active.  Typical day's intake: Breakfast - alternates between dry cereal and oatmeal Lunch - sandwich or hamburger Dinner - same as lunch ONS - drinks one Boost daily before bed  Scheduled Meds:  apixaban   2.5 mg Oral BID   cholecalciferol  1,000 Units Oral Daily   cholestyramine light  4 g Oral BID   cycloSPORINE   1 drop Both Eyes BID   ferrous sulfate  325 mg Oral BID WC   latanoprost   1 drop Both Eyes QHS   levothyroxine   88 mcg Oral QAC breakfast   loratadine  10 mg Oral QPM   pantoprazole   40  mg Oral Daily   polyethylene glycol  17 g Oral Daily   rosuvastatin   20 mg Oral Daily   sacubitril-valsartan  1 tablet Oral QHS  Potassium Phosphate 30 mmol IV 11/12 at 0900  Sodium Phosphate  15 mmol IV 11/12 at 0217 and 0913  Continuous Infusions:  cefTRIAXone (ROCEPHIN)  IV     doxycycline (VIBRAMYCIN) IV 100 mg (03/05/24 0908)   potassium PHOSPHATE IVPB (in mmol)      Diet Order             Diet Heart Room service appropriate? Yes; Fluid consistency: Thin  Diet effective now                  Meal Intake: 75% per the patient's son  Labs:     Latest Ref Rng & Units 03/05/2024    1:03 AM 03/04/2024    7:11 PM 03/04/2024    6:55 PM  CMP  Glucose 70 - 99 mg/dL 795  885  879   BUN 8 - 23 mg/dL 13  16  15    Creatinine 0.61 - 1.24 mg/dL 9.06  8.99  9.04   Sodium 135 - 145 mmol/L 134  133  131   Potassium 3.5 - 5.1 mmol/L 4.4  4.3  4.2   Chloride 98 - 111 mmol/L 99  97  95   CO2 22 - 32 mmol/L 24   24   Calcium  8.9 - 10.3 mg/dL 8.8  9.1   Total Protein 6.5 - 8.1 g/dL 6.8   7.5   Total Bilirubin 0.0 - 1.2 mg/dL 0.7   0.4   Alkaline Phos 38 - 126 U/L 268   314   AST 15 - 41 U/L 30   29   ALT 0 - 44 U/L 27   32      I/O: +400 mL since admit  NUTRITION - FOCUSED PHYSICAL EXAM:  Flowsheet Row Most Recent Value  Orbital Region No depletion  Upper Arm Region Mild depletion  Thoracic and Lumbar Region Mild depletion  Buccal Region Mild depletion  Temple Region Severe depletion  Clavicle Bone Region Severe depletion  Clavicle and Acromion Bone Region Severe depletion  Scapular Bone Region Severe depletion  Dorsal Hand No depletion  Patellar Region No depletion  Anterior Thigh Region Moderate depletion  Posterior Calf Region Moderate depletion  Edema (RD Assessment) Mild  Hair Reviewed  Eyes Reviewed  Mouth Other (Comment)  [missing teeth, recent extractions]  Skin Reviewed  Nails Reviewed    EDUCATION NEEDS:   Education needs have been addressed  Skin:   Skin Assessment: Reviewed RN Assessment  Last BM:  11/11  Height:   Ht Readings from Last 1 Encounters:  03/04/24 6' 1 (1.854 m)    Weight:    Weight Change: no significant weight change  Usual Body Weight: 165 lbs for over 70 years per patient  Edema: generalized non-pitting  Ideal Body Weight:  84 kg     BMI:  Body mass index is 22.86 kg/m.  Estimated Nutritional Needs:  Kcal:  2100-2400 Protein:  110-130 Fluid:  >1800, hyponatremia noted    Leverne Ruth, MS, RDN, LDN [place] See AMION for contact information

## 2024-03-05 NOTE — Progress Notes (Signed)
 PROGRESS NOTE    Elijah Stone  FMW:[id number] DOB: November 14, 1930 DOA: 03/04/2024 PCP: Elijah Hansel Atlas, MD   Brief Narrative:  Elijah Stone is a 89 y.o. male with medical history significant of   a.fib on eliquis  status post Watchman device, hypothyroidism, HLD systolic CHF EF 25%, Presented with  generalized weakness, felt too weak to get off the commode and had a  fall hitting head while on eliquis .  Endorsed having cough and fever at home associated with chills. he had 3 teeth extracted on 7 November but otherwise have not had any respiratory symptoms no urinary complaints he did do COVID test at home which was negative. At the provider office blood pressure was 97/50 UA showed no evidence of UTI. Chest x-ray showed possible multifocal pneumonia He was started on Levaquin 750 mg daily. Of note patient have had some pruritus over the past few weeks to months he was found to have elevated LFTs and particularly alk phos and GGT with ultrasound showing liver disease and referred to gastroenterology. Hepatic serologies negative for hepatitis AMA is normal He was started on cholestyramine 4 g twice a day  Assessment & Plan:   Principal Problem:   CAP (community acquired pneumonia) Active Problems:   Acquired hypothyroidism   Atrial fibrillation (HCC)   Chronic systolic CHF (congestive heart failure) (HCC)   Essential hypertension   Debility   Prolonged QT interval   Hyponatremia  Community-acquired pneumonia: Chest x-ray shows multifocal pneumonia, mostly right upper lobe.  Patient has history of cough.  Patient was afebrile, did not have any fever as mentioned in H&P.  Does not have any leukocytosis.  Started on Rocephin, Zithromax as well as doxycycline.  Will continue Rocephin and doxycycline, discontinue Zithromax.  COVID, RSV as well as influenza negative.  Urine antigen for streptococci negative.  Procalcitonin unremarkable.  Will check respiratory viral panel.  Generalized  weakness/fall: Evaluated by PT OT, SNF recommended.  TOC made aware.  Chronic normocytic/iron deficiency anemia: Workup indicates iron deficiency anemia.  He is on B12 supplements but B12 is above normal level.  Will hold B12, start on iron supplements.  Monitor daily.  Paroxysmal atrial fibrillation: Rates controlled.  Continue home medications as well as Eliquis .  Hypophosphatemia: Replenished.  Mild hyponatremia: Monitor.  Essential hypertension: Blood pressure elevated.  Resuming home Entresto.  Acquired hypothyroidism: Continue Synthroid .  Hyperlipidemia: Resume Crestor .  GERD: Resuming PPI.  DVT prophylaxis: apixaban  (ELIQUIS ) tablet 2.5 mg Start: 03/04/24 2300 SCDs Start: 03/04/24 2241   Code Status: Limited: Do not attempt resuscitation (DNR) -DNR-LIMITED -Do Not Intubate/DNI   Family Communication: Son present at bedside.  Plan of care discussed with patient in length and he/she verbalized understanding and agreed with it.  Status is: Inpatient Remains inpatient appropriate because: Needs SNF   Estimated body mass index is 22.86 kg/m as calculated from the following:   Height as of this encounter: 6' 1 (1.854 m).   Weight as of this encounter: 78.6 kg.    Nutritional Assessment: Body mass index is 22.86 kg/m.SABRA Seen by dietician.  I agree with the assessment and plan as outlined below: Nutrition Status:        . Skin Assessment: I have examined the patient's skin and I agree with the wound assessment as performed by the wound care RN as outlined below:    Consultants:  None  Procedures:  None  Antimicrobials:  Anti-infectives (From admission, onward)    Start     Dose/Rate Route Frequency Ordered Stop  03/05/24 2100  azithromycin (ZITHROMAX) 500 mg in sodium chloride  0.9 % 250 mL IVPB  Status:  Discontinued        500 mg 250 mL/hr over 60 Minutes Intravenous Every 24 hours 03/04/24 2112 03/05/24 0753   03/05/24 1900  cefTRIAXone (ROCEPHIN) 2 g in  sodium chloride  0.9 % 100 mL IVPB        2 g 200 mL/hr over 30 Minutes Intravenous Every 24 hours 03/04/24 2112 03/10/24 1859   03/04/24 2230  doxycycline (VIBRAMYCIN) 100 mg in sodium chloride  0.9 % 250 mL IVPB        100 mg 125 mL/hr over 120 Minutes Intravenous 2 times daily 03/04/24 2200     03/04/24 2100  cefTRIAXone (ROCEPHIN) 1 g in sodium chloride  0.9 % 100 mL IVPB        1 g 200 mL/hr over 30 Minutes Intravenous  Once 03/04/24 2047 03/04/24 2331   03/04/24 2100  azithromycin (ZITHROMAX) 500 mg in sodium chloride  0.9 % 250 mL IVPB  Status:  Discontinued        500 mg 250 mL/hr over 60 Minutes Intravenous  Once 03/04/24 2047 03/04/24 2200         Subjective: Patient seen and examined, very hard of hearing.  Patient's son at the bedside.  Patient complains of left posterior rib pain and mild tenderness on palpation.  Denies any shortness of breath.  Has dry cough.  No other complaint.  Objective: Vitals:   03/04/24 2003 03/04/24 2312 03/05/24 0507 03/05/24 0723  BP:  (!) 159/81 (!) 142/77 (!) 147/93  Pulse:  89 84 100  Resp:  16 17 (!) 22  Temp:  98.4 F (36.9 C) 98.2 F (36.8 C) 97.8 F (36.6 C)  TempSrc:  Oral  Oral  SpO2:  97% 97% 100%  Weight: 74.8 kg 78.6 kg    Height: 6' 3 (1.905 m) 6' 1 (1.854 m)      Intake/Output Summary (Last 24 hours) at 03/05/2024 0755 Last data filed at 03/05/2024 0500 Gross per 24 hour  Intake 781.9 ml  Output 620 ml  Net 161.9 ml   Filed Weights   03/04/24 2003 03/04/24 2312  Weight: 74.8 kg 78.6 kg    Examination:  General exam: Appears calm and comfortable, very hard of hearing Respiratory system: Clear to auscultation. Respiratory effort normal.  Mild tenderness at the posterior left rib Cardiovascular system: S1 & S2 heard, RRR. No JVD, murmurs, rubs, gallops or clicks. No pedal edema. Gastrointestinal system: Abdomen is nondistended, soft and nontender. No organomegaly or masses felt. Normal bowel sounds heard. Central  nervous system: Alert and oriented. No focal neurological deficits. Extremities: Symmetric 5 x 5 power. Skin: No rashes, lesions or ulcers Psychiatry: Judgement and insight appear normal. Mood & affect appropriate.    Data Reviewed: I have personally reviewed following labs and imaging studies  CBC: Recent Labs  Lab 03/04/24 1855 03/04/24 1911 03/05/24 0103  WBC 7.4  --  7.7  HGB 11.4* 12.9* 10.4*  HCT 35.0* 38.0* 31.2*  MCV 94.6  --  94.5  PLT 192  --  179   Basic Metabolic Panel: Recent Labs  Lab 03/04/24 1855 03/04/24 1911 03/05/24 0103  NA 131* 133* 134*  K 4.2 4.3 4.4  CL 95* 97* 99  CO2 24  --  24  GLUCOSE 120* 114* 204*  BUN 15 16 13   CREATININE 0.95 1.00 0.93  CALCIUM  9.1  --  8.8*  MG 2.0  --  2.0  PHOS 2.3*  --  2.3*   GFR: Estimated Creatinine Clearance: 57.5 mL/min (by C-G formula based on SCr of 0.93 mg/dL). Liver Function Tests: Recent Labs  Lab 03/04/24 1855 03/05/24 0103  AST 29 30  ALT 32 27  ALKPHOS 314* 268*  BILITOT 0.4 0.7  PROT 7.5 6.8  ALBUMIN 3.3* 3.0*   No results for input(s): LIPASE, AMYLASE in the last 168 hours. No results for input(s): AMMONIA in the last 168 hours. Coagulation Profile: Recent Labs  Lab 03/04/24 1855  INR 1.2   Cardiac Enzymes: Recent Labs  Lab 03/04/24 1855  CKTOTAL 138   BNP (last 3 results) No results for input(s): PROBNP in the last 8760 hours. HbA1C: No results for input(s): HGBA1C in the last 72 hours. CBG: No results for input(s): GLUCAP in the last 168 hours. Lipid Profile: No results for input(s): CHOL, HDL, LDLCALC, TRIG, CHOLHDL, LDLDIRECT in the last 72 hours. Thyroid Function Tests: No results for input(s): TSH, T4TOTAL, FREET4, T3FREE, THYROIDAB in the last 72 hours. Anemia Panel: Recent Labs    03/04/24 1855 03/05/24 0103  VITAMINB12  --  1,130*  FOLATE  --  >20.0  FERRITIN  --  65  TIBC  --  325  IRON  --  19*  RETICCTPCT 1.4  --     Sepsis Labs: Recent Labs  Lab 03/04/24 1911 03/04/24 2116 03/04/24 2117  PROCALCITON  --  <0.10  --   LATICACIDVEN 0.9  --  0.7    Recent Results (from the past 240 hours)  Resp panel by RT-PCR (RSV, Flu A&B, Covid) Anterior Nasal Swab     Status: None   Collection Time: 03/04/24  8:59 PM   Specimen: Anterior Nasal Swab  Result Value Ref Range Status   SARS Coronavirus 2 by RT PCR NEGATIVE NEGATIVE Final   Influenza A by PCR NEGATIVE NEGATIVE Final   Influenza B by PCR NEGATIVE NEGATIVE Final    Comment: (NOTE) The Xpert Xpress SARS-CoV-2/FLU/RSV plus assay is intended as an aid in the diagnosis of influenza from Nasopharyngeal swab specimens and should not be used as a sole basis for treatment. Nasal washings and aspirates are unacceptable for Xpert Xpress SARS-CoV-2/FLU/RSV testing.  Fact Sheet for Patients: bloggercourse.com  Fact Sheet for Healthcare Providers: seriousbroker.it  This test is not yet approved or cleared by the [place]  FDA and has been authorized for detection and/or diagnosis of SARS-CoV-2 by FDA under an Emergency Use Authorization (EUA). This EUA will remain in effect (meaning this test can be used) for the duration of the COVID-19 declaration under Section 564(b)(1) of the Act, 21 U.S.C. section 360bbb-3(b)(1), unless the authorization is terminated or revoked.     Resp Syncytial Virus by PCR NEGATIVE NEGATIVE Final    Comment: (NOTE) Fact Sheet for Patients: bloggercourse.com  Fact Sheet for Healthcare Providers: seriousbroker.it  This test is not yet approved or cleared by the [place]  FDA and has been authorized for detection and/or diagnosis of SARS-CoV-2 by FDA under an Emergency Use Authorization (EUA). This EUA will remain in effect (meaning this test can be used) for the duration of the COVID-19 declaration under Section  564(b)(1) of the Act, 21 U.S.C. section 360bbb-3(b)(1), unless the authorization is terminated or revoked.  Performed at [facility], [address], [place], [place] [address]      Radiology Studies: CT L-SPINE NO CHARGE Result Date: 03/04/2024 EXAM: CT OF THE LUMBAR SPINE WITHOUT CONTRAST 03/04/2024 07:48:00 PM TECHNIQUE:  CT of the lumbar spine was performed without the administration of intravenous contrast. Multiplanar reformatted images are provided for review. Automated exposure control, iterative reconstruction, and/or weight based adjustment of the mA/kV was utilized to reduce the radiation dose to as low as reasonably achievable. COMPARISON: None available. CLINICAL HISTORY: FINDINGS: BONES AND ALIGNMENT: Normal vertebral body heights. No acute fracture or suspicious bone lesion. Mild lumbar levoscoliosis. DEGENERATIVE CHANGES: Mild multilevel degenerative changes. SOFT TISSUES: No acute abnormality. IMPRESSION: 1. No acute findings. Electronically signed by: Pinkie Pebbles MD 03/04/2024 08:03 PM EST RP Workstation: [id number]   CT T-SPINE NO CHARGE Result Date: 03/04/2024 EXAM: CT THORACIC SPINE WITHOUT CONTRAST 03/04/2024 07:48:00 PM TECHNIQUE: CT of the thoracic spine was performed without the administration of intravenous contrast. Multiplanar reformatted images are provided for review. Automated exposure control, iterative reconstruction, and/or weight based adjustment of the mA/kV was utilized to reduce the radiation dose to as low as reasonably achievable. COMPARISON: None available. CLINICAL HISTORY: FINDINGS: BONES AND ALIGNMENT: Normal vertebral body heights. No acute fracture or suspicious bone lesion. Normal alignment. DEGENERATIVE CHANGES: Mild degenerative changes of the lower thoracic spine. SOFT TISSUES: No acute abnormality. IMPRESSION: 1. No acute abnormality of the thoracic spine. Electronically signed by: Pinkie Pebbles MD 03/04/2024 08:02 PM EST RP  Workstation: [id number]   CT Cervical Spine Wo Contrast Result Date: 03/04/2024 EXAM: CT CERVICAL SPINE WITHOUT CONTRAST 03/04/2024 07:48:00 PM TECHNIQUE: CT of the cervical spine was performed without the administration of intravenous contrast. Multiplanar reformatted images are provided for review. Automated exposure control, iterative reconstruction, and/or weight based adjustment of the mA/kV was utilized to reduce the radiation dose to as low as reasonably achievable. COMPARISON: None available. CLINICAL HISTORY: Ataxia, cervical trauma. FINDINGS: CERVICAL SPINE: BONES AND ALIGNMENT: No acute fracture or traumatic malalignment. DEGENERATIVE CHANGES: Mild degenerative changes of the mid cervical spine. SOFT TISSUES: No prevertebral soft tissue swelling. IMPRESSION: 1. No acute abnormality of the cervical spine. Electronically signed by: Pinkie Pebbles MD 03/04/2024 08:01 PM EST RP Workstation: [id number]   CT Angio Chest/Abd/Pel for Dissection W and/or Wo Contrast Result Date: 03/04/2024 EXAM: CTA CHEST, ABDOMEN AND PELVIS WITHOUT AND WITH CONTRAST 03/04/2024 07:48:00 PM TECHNIQUE: CTA of the chest was performed without and with the administration of 85 mL of intravenous iohexol  (OMNIPAQUE ) 350 MG/ML injection. CTA of the abdomen and pelvis was performed without and with the administration of 85 mL of intravenous iohexol  (OMNIPAQUE ) 350 MG/ML injection. Multiplanar reformatted images are provided for review. MIP images are provided for review. Automated exposure control, iterative reconstruction, and/or weight based adjustment of the mA/kV was utilized to reduce the radiation dose to as low as reasonably achievable. COMPARISON: None available. CLINICAL HISTORY: Acute aortic syndrome (AAS) suspected. FINDINGS: VASCULATURE: AORTA: No thoracic aortic aneurysm or dissection. Thoracic aortic atherosclerosis. No abdominal aortic aneurysm or dissection. Atherosclerotic calcifications of the abdominal aorta  and branch vessels, although patent. PULMONARY ARTERIES: No central pulmonary embolism with the limits of this exam. GREAT VESSELS OF AORTIC ARCH: No acute finding. No dissection. No arterial occlusion or significant stenosis. CELIAC TRUNK: No acute finding. No occlusion or significant stenosis. SUPERIOR MESENTERIC ARTERY: No acute finding. No occlusion or significant stenosis. INFERIOR MESENTERIC ARTERY: No acute finding. No occlusion or significant stenosis. RENAL ARTERIES: No acute finding. No occlusion or significant stenosis. ILIAC ARTERIES: No acute finding. No occlusion or significant stenosis. CHEST: MEDIASTINUM: No mediastinal lymphadenopathy. Mild cardiomegaly. Left atrial appendage occluder. Mild coronary atherosclerosis of the LAD and right coronary artery. The pericardium demonstrates no  acute abnormality. LUNGS AND PLEURA: Multifocal subpleural patchy opacities, right upper lobe predominant, suggesting multifocal pneumonia although underlying post-infectious/inflammatory scarring is possible. Biapical pleural parenchymal scarring. Trace right pleural effusion. No pneumothorax. THORACIC BONES AND SOFT TISSUES: Mild degenerative changes of the lower thoracic spine. No acute soft tissue abnormality. ABDOMEN AND PELVIS: LIVER: The liver is unremarkable. GALLBLADDER AND BILE DUCTS: Gallbladder is unremarkable. No biliary ductal dilatation. SPLEEN: The spleen is unremarkable. PANCREAS: The pancreas is unremarkable. ADRENAL GLANDS: Bilateral adrenal glands demonstrate no acute abnormality. KIDNEYS, URETERS AND BLADDER: 4 mm nonobstructing right lower pole renal calculus (image 130). Bilateral renal cysts, measuring up to 3.5 cm in the left upper kidney, benign (Bosniak 1). No follow-up is recommended. No hydronephrosis. No perinephric or periureteral stranding. Urinary bladder is unremarkable. GI AND BOWEL: Stomach and duodenal sweep demonstrate no acute abnormality. Sigmoid diverticulosis, without evidence  of diverticulitis. There is no bowel obstruction. No abnormal bowel wall thickening or distension. REPRODUCTIVE: Diminutive prostate. PERITONEUM AND RETROPERITONEUM: No ascites or free air. LYMPH NODES: No lymphadenopathy. ABDOMINAL BONES AND SOFT TISSUES: Mild degenerative changes of the upper lumbar spine. Mild lumbar levoscoliosis. No acute soft tissue abnormality. IMPRESSION: 1. No thoracoabdominal aortic aneurysm or dissection. 2. No central pulmonary embolism. 3. Suspected multifocal pneumonia, right upper lobe predominant. Underlying postinfectious/inflammatory scarring is possible. 4. Trace right pleural effusion. 5. Additional ancillary findings, as above. Electronically signed by: Pinkie Pebbles MD 03/04/2024 08:00 PM EST RP Workstation: [id number]   CT HEAD WO CONTRAST Result Date: 03/04/2024 EXAM: CT HEAD WITHOUT CONTRAST 03/04/2024 07:48:00 PM TECHNIQUE: CT of the head was performed without the administration of intravenous contrast. Automated exposure control, iterative reconstruction, and/or weight based adjustment of the mA/kV was utilized to reduce the radiation dose to as low as reasonably achievable. COMPARISON: 12/13/2023 CLINICAL HISTORY: Head trauma, moderate-severe. FINDINGS: BRAIN AND VENTRICLES: No acute hemorrhage. No evidence of acute infarct. No hydrocephalus. No extra-axial collection. No mass effect or midline shift. Stable atrophy. Periventricular and deep white matter hypodensity typical of chronic small vessel ischemia. Unchanged chronic gyriform mineralized densities in the occipital lobes. 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 Intracranial atherosclerosis. ORBITS: No acute abnormality. SINUSES: No acute abnormality. SOFT TISSUES AND SKULL: No acute soft tissue abnormality. No skull fracture. IMPRESSION: 1. No acute intracranial abnormality. 2. Prior infarcts and additional stable ancillary findings, as above. Electronically signed by: Pinkie Pebbles  MD 03/04/2024 07:55 PM EST RP Workstation: [id number]   DG Pelvis Portable Result Date: 03/04/2024 EXAM: 1 VIEW(S) XRAY OF THE PELVIS 03/04/2024 07:04:56 PM COMPARISON: None available. CLINICAL HISTORY: Trauma FINDINGS: BONES AND JOINTS: No acute fracture. No focal osseous lesion. No joint dislocation. Mild degenerative changes at L4-L5. SOFT TISSUES: The soft tissues are unremarkable. IMPRESSION: 1. No evidence of acute traumatic injury. Electronically signed by: Pinkie Pebbles MD 03/04/2024 07:11 PM EST RP Workstation: [id number]   DG Chest Port 1 View Result Date: 03/04/2024 EXAM: 1 VIEW(S) XRAY OF THE CHEST 03/04/2024 07:04:56 PM COMPARISON: 12/26/2021. CLINICAL HISTORY: Trauma FINDINGS: LINES, TUBES AND DEVICES: Cardiac device overlying the left upper heart. LUNGS AND PLEURA: Calcified left upper lobe granuloma. Multifocal patchy opacities in the bilateral upper lobes, suspicious for pneumonia. Mild subpleural scarring at the lung bases. Biapical pleural parenchymal scarring. No pulmonary edema. No pleural effusion. No pneumothorax. HEART AND MEDIASTINUM: No acute abnormality of the cardiac and mediastinal silhouettes. BONES AND SOFT TISSUES: No acute osseous abnormality. IMPRESSION: 1. Multifocal patchy opacities in the bilateral upper lobes, suspicious for pneumonia. Electronically signed  by: Sriyesh Krishnan MD 03/04/2024 07:10 PM EST RP Workstation: [id number]    Scheduled Meds:  apixaban   2.5 mg Oral BID   cholecalciferol  1,000 Units Oral Daily   cholestyramine light  4 g Oral BID   cycloSPORINE   1 drop Both Eyes BID   ferrous sulfate  325 mg Oral BID WC   latanoprost   1 drop Both Eyes QHS   levocetirizine  5 mg Oral QPM   levothyroxine   88 mcg Oral QAC breakfast   pantoprazole   40 mg Oral Daily   polyethylene glycol  17 g Oral Daily   rosuvastatin   20 mg Oral Daily   sacubitril-valsartan  1 tablet Oral QHS   Continuous Infusions:  sodium chloride  50 mL/hr at 03/04/24 2343    cefTRIAXone (ROCEPHIN)  IV     doxycycline (VIBRAMYCIN) IV 100 mg (03/05/24 0049)   potassium PHOSPHATE IVPB (in mmol)     sodium PHOSPHATE  IVPB (in mmol) 15 mmol (03/05/24 0217)     LOS: 1 day   Fredia Skeeter, MD [facility]  03/05/2024, 7:55 AM   *Please note that this is a verbal dictation therefore any spelling or grammatical errors are due to the Dragon Medical One system interpretation.  Please page via Amion and do not message via secure chat for urgent patient care matters. Secure chat can be used for non urgent patient care matters.  How to contact the [facility] Attending or Consulting provider 7A - 7P or covering provider during after hours 7P -7A, for this patient?  Check the care team in [facility] and look for a) attending/consulting [facility] provider listed and b) the [facility] team listed. Page or secure chat 7A-7P. Log into [email or web address] and use [place]'s universal password to access. If you do not have the password, please contact the hospital operator. Locate the [facility] provider you are looking for under [facility] and page to a number that you can be directly reached. If you still have difficulty reaching the provider, please page the [facility] (Director on Call) for the Hospitalists listed on amion for assistance.

## 2024-03-05 NOTE — [place] FL2 (Signed)
 [place]  MEDICAID FL2 LEVEL OF CARE FORM     IDENTIFICATION  Patient Name: Elijah Stone Birthdate: [birth year] Sex: male Admission Date (Current Location): 03/04/2024  [facility] and [place] Number:  Producer, Television/film/video and Address:  [facility], [address], [place], [place] [address]      Provider Number: [id number]  Attending Physician Name and Address:  Vernon Ranks, MD  Relative Name and Phone Number:  Wilbert Hayashi   [phone]    Current Level of Care: Hospital Recommended Level of Care: Skilled Nursing Facility Prior Approval Number:    Date Approved/Denied:   PASRR Number: [id number]  Discharge Plan: SNF    Current Diagnoses: Patient Active Problem List   Diagnosis Date Noted   Malnutrition of moderate degree 03/05/2024   CAP (community acquired pneumonia) 03/04/2024   Debility 03/04/2024   Prolonged QT interval 03/04/2024   Hyponatremia 03/04/2024   Bilateral sensorineural hearing loss 06/26/2022   Stroke (HCC) 09/12/2021   Middle cerebral artery embolism, right 09/12/2021   Corneal guttata of both eyes 08/09/2021   Dry eye syndrome of both lacrimal glands 08/09/2021   Primary open angle glaucoma (POAG) of both eyes, mild stage 08/09/2021   Dysuria 09/24/2020   Prostate nodule 09/09/2020   Restrictive lung disease 08/24/2020   Abnormal chest CT 07/27/2020   Myelodysplastic syndrome (HCC) 08/05/2019   B12 deficiency 11/14/2018   Blepharitis of upper and lower eyelids of both eyes 10/14/2018   Dermatochalasis of both upper eyelids 10/14/2018   Posterior vitreous detachment of left eye 10/14/2018   Prediabetes 10/14/2018   Acquired contracture of bladder neck 04/22/2018   Acquired hypothyroidism 03/13/2018   Allergic rhinitis 03/13/2018   BPH (benign prostatic hyperplasia) 03/13/2018   H/O type B viral hepatitis 03/13/2018   Hearing loss 03/13/2018   Pancytopenia (HCC) 03/13/2018   Essential hypertension 05/01/2016    Atrial fibrillation (HCC) 03/06/2016   Cerebral hemorrhage (HCC) 03/06/2016   Chronic systolic CHF (congestive heart failure) (HCC) 03/06/2016   Other hyperlipidemia 03/06/2016    Orientation RESPIRATION BLADDER Height & Weight     Self, Time, Situation, Place  Normal Incontinent Weight: 173 lb 4.5 oz (78.6 kg) Height:  6' 1 (185.4 cm)  BEHAVIORAL SYMPTOMS/MOOD NEUROLOGICAL BOWEL NUTRITION STATUS      Continent Diet (see dc summary)  AMBULATORY STATUS COMMUNICATION OF NEEDS Skin   Limited Assist Verbally Normal                       Personal Care Assistance Level of Assistance  Bathing, Feeding, Dressing Bathing Assistance: Limited assistance Feeding assistance: Independent Dressing Assistance: Limited assistance     Functional Limitations Info  Sight, Hearing, Speech Sight Info: Adequate Hearing Info: Impaired Speech Info: Adequate    SPECIAL CARE FACTORS FREQUENCY  PT (By licensed PT), OT (By licensed OT)     PT Frequency: 5x a week OT Frequency: 5x a week            Contractures Contractures Info: Not present    Additional Factors Info  Code Status, Allergies Code Status Info: DNR Allergies Info: Codeine           Current Medications (03/05/2024):  This is the current hospital active medication list Current Facility-Administered Medications  Medication Dose Route Frequency Provider Last Rate Last Admin   acetaminophen  (TYLENOL ) tablet 650 mg  650 mg Oral Q6H PRN Doutova, Anastassia, MD       Or   acetaminophen  (TYLENOL )  suppository 650 mg  650 mg Rectal Q6H PRN Doutova, Anastassia, MD       apixaban  (ELIQUIS ) tablet 2.5 mg  2.5 mg Oral BID Doutova, Anastassia, MD   2.5 mg at 03/05/24 0850   cefTRIAXone (ROCEPHIN) 2 g in sodium chloride  0.9 % 100 mL IVPB  2 g Intravenous Q24H Doutova, Anastassia, MD       cholecalciferol (VITAMIN D3) 25 MCG (1000 UNIT) tablet 1,000 Units  1,000 Units Oral Daily Vernon Ranks, MD   1,000 Units at 03/05/24 0850    cholestyramine light (PREVALITE) packet 4 g  4 g Oral BID Doutova, Anastassia, MD       cycloSPORINE  (RESTASIS ) 0.05 % ophthalmic emulsion 1 drop  1 drop Both Eyes BID Vernon Ranks, MD   1 drop at 03/05/24 0905   doxycycline (VIBRAMYCIN) 100 mg in sodium chloride  0.9 % 250 mL IVPB  100 mg Intravenous BID Doutova, Anastassia, MD 125 mL/hr at 03/05/24 0908 100 mg at 03/05/24 0908   feeding supplement (ENSURE PLUS HIGH PROTEIN) liquid 237 mL  237 mL Oral BID BM Pahwani, Ravi, MD       ferrous sulfate tablet 325 mg  325 mg Oral BID WC Pahwani, Ravi, MD   325 mg at 03/05/24 0849   HYDROcodone -acetaminophen  (NORCO/VICODIN) 5-325 MG per tablet 1-2 tablet  1-2 tablet Oral Q4H PRN Doutova, Anastassia, MD       hydrOXYzine (ATARAX) tablet 10 mg  10 mg Oral Q6H PRN Doutova, Anastassia, MD       latanoprost  (XALATAN ) 0.005 % ophthalmic solution 1 drop  1 drop Both Eyes QHS Doutova, Anastassia, MD   1 drop at 03/05/24 0114   levothyroxine  (SYNTHROID ) tablet 88 mcg  88 mcg Oral QAC breakfast Doutova, Anastassia, MD   88 mcg at 03/05/24 0850   loratadine (CLARITIN) tablet 10 mg  10 mg Oral QPM Pahwani, Ranks, MD       Oral care mouth rinse  15 mL Mouth Rinse PRN Vernon Ranks, MD       pantoprazole  (PROTONIX ) EC tablet 40 mg  40 mg Oral Daily Doutova, Anastassia, MD   40 mg at 03/05/24 0850   polyethylene glycol (MIRALAX  / GLYCOLAX ) packet 17 g  17 g Oral Daily Doutova, Anastassia, MD   17 g at 03/05/24 0850   potassium PHOSPHATE 30 mmol in dextrose  5 % 500 mL infusion  30 mmol Intravenous Once Pahwani, Ravi, MD 85 mL/hr at 03/05/24 1159 30 mmol at 03/05/24 1159   rosuvastatin  (CRESTOR ) tablet 20 mg  20 mg Oral Daily Pahwani, Ravi, MD   20 mg at 03/05/24 0849   sacubitril-valsartan (ENTRESTO) 24-26 mg per tablet  1 tablet Oral QHS Vernon Ranks, MD         Discharge Medications: Please see discharge summary for a list of discharge medications.  Relevant Imaging Results:  Relevant Lab  Results:   Additional Information SSN: [id number]  Lendia Dais, LCSWA

## 2024-03-05 NOTE — Discharge Instructions (Signed)

## 2024-03-05 NOTE — TOC Progression Note (Signed)
 Transition of Care [facility]) - Progression Note    Patient Details  Name: Elijah Stone MRN: [id number] Date of Birth: 05-May-1930  [facility]) CM/SW Contact  Lendia Dais, [place] Phone Number: 03/05/2024, 1:21 PM  Clinical Narrative:  CSW spoke to pt and pt's son at bedside and introduced self and role.   Pt and son are agreeable to PT recs of SNF.  CSW explained the referral process and needs insurance auth once a facility is chosen. Medicare.gov list was given. Pt and pt's son were agreeable. CSW sent referrals out in the HUB and are pending bed offers.  CSW will continue to monitor.                      Expected Discharge Plan and Services                                               Social Drivers of Health (SDOH) Interventions SDOH Screenings   Food Insecurity: No Food Insecurity (03/05/2024)  Housing: Low Risk  (03/05/2024)  Transportation Needs: No Transportation Needs (03/05/2024)  Utilities: Not At Risk (03/05/2024)  Social Connections: Unknown (03/05/2024)  Tobacco Use: Medium Risk (03/03/2024)   Received from [facility]    Readmission Risk Interventions     No data to display

## 2024-03-06 ENCOUNTER — Other Ambulatory Visit: Payer: Self-pay

## 2024-03-06 DIAGNOSIS — J189 Pneumonia, unspecified organism: Secondary | ICD-10-CM | POA: Diagnosis not present

## 2024-03-06 LAB — RENAL FUNCTION PANEL
Albumin: 2.7 g/dL — ABNORMAL LOW (ref 3.5–5.0)
Anion gap: 9 (ref 5–15)
BUN: 9 mg/dL (ref 8–23)
CO2: 25 mmol/L (ref 22–32)
Calcium: 8.7 mg/dL — ABNORMAL LOW (ref 8.9–10.3)
Chloride: 97 mmol/L — ABNORMAL LOW (ref 98–111)
Creatinine, Ser: 0.84 mg/dL (ref 0.61–1.24)
GFR, Estimated: >60 mL/min (ref >60)
Glucose, Bld: 130 mg/dL — ABNORMAL HIGH (ref 70–99)
Phosphorus: 2.7 mg/dL (ref 2.5–4.6)
Potassium: 4 mmol/L (ref 3.5–5.1)
Sodium: 131 mmol/L — ABNORMAL LOW (ref 135–145)

## 2024-03-06 LAB — RESPIRATORY PANEL BY PCR

## 2024-03-06 LAB — CBC WITH DIFFERENTIAL/PLATELET
Abs Immature Granulocytes: 0.02 K/uL (ref 0.00–0.07)
Basophils Absolute: 0 K/uL (ref 0.0–0.1)
Basophils Relative: 0 %
Eosinophils Absolute: 0.3 K/uL (ref 0.0–0.5)
Eosinophils Relative: 4 %
HCT: 32.7 % — ABNORMAL LOW (ref 39.0–52.0)
Hemoglobin: 10.8 g/dL — ABNORMAL LOW (ref 13.0–17.0)
Immature Granulocytes: 0 %
Lymphocytes Relative: 22 %
Lymphs Abs: 1.5 K/uL (ref 0.7–4.0)
MCH: 31 pg (ref 26.0–34.0)
MCHC: 33 g/dL (ref 30.0–36.0)
MCV: 94 fL (ref 80.0–100.0)
Monocytes Absolute: 1 K/uL (ref 0.1–1.0)
Monocytes Relative: 15 %
Neutro Abs: 4.1 K/uL (ref 1.7–7.7)
Neutrophils Relative %: 59 %
Platelets: 193 K/uL (ref 150–400)
RBC: 3.48 MIL/uL — ABNORMAL LOW (ref 4.22–5.81)
RDW: 14.8 % (ref 11.5–15.5)
WBC: 6.9 K/uL (ref 4.0–10.5)
nRBC: 0 % (ref 0.0–0.2)

## 2024-03-06 LAB — PHOSPHORUS: Phosphorus: 2.5 mg/dL (ref 2.5–4.6)

## 2024-03-06 LAB — SODIUM, URINE, RANDOM: Sodium, Ur: 78 mmol/L

## 2024-03-06 LAB — LEGIONELLA PNEUMOPHILA SEROGP 1 UR AG: L. pneumophila Serogp 1 Ur Ag: NEGATIVE

## 2024-03-06 LAB — OSMOLALITY, URINE: Osmolality, Ur: 317 mosm/kg (ref 300–900)

## 2024-03-06 LAB — OSMOLALITY: Osmolality: 296 mosm/kg — ABNORMAL HIGH (ref 275–295)

## 2024-03-06 MED ORDER — LEVOTHYROXINE SODIUM 88 MCG PO TABS
88.0000 ug | ORAL_TABLET | Freq: Every day | ORAL | Status: DC
  Administered 2024-03-07: 88 ug via ORAL
  Filled 2024-03-06: qty 1

## 2024-03-06 MED ORDER — POLYVINYL ALCOHOL 1.4 % OP SOLN
1.0000 [drp] | Freq: Two times a day (BID) | OPHTHALMIC | Status: DC
  Administered 2024-03-06 – 2024-03-07 (×3): 1 [drp] via OPHTHALMIC
  Filled 2024-03-06: qty 15

## 2024-03-06 MED ORDER — DOXYCYCLINE HYCLATE 100 MG PO TABS
100.0000 mg | ORAL_TABLET | Freq: Two times a day (BID) | ORAL | Status: DC
  Administered 2024-03-06 – 2024-03-07 (×3): 100 mg via ORAL
  Filled 2024-03-06 (×3): qty 1

## 2024-03-06 MED ORDER — APIXABAN 2.5 MG PO TABS: 2.5000 mg | ORAL_TABLET | Freq: Two times a day (BID) | ORAL | 1 refills | Status: AC

## 2024-03-06 NOTE — Evaluation (Signed)
 Speech Language Pathology Evaluation Patient Details Name: Elijah Stone MRN: [id number] DOB: 1931/04/09 Today's Date: 03/06/2024 Time: 1020-1059 SLP Time Calculation (min) (ACUTE ONLY): 39 min  Problem List:  Patient Active Problem List   Diagnosis Date Noted   Malnutrition of moderate degree 03/05/2024   CAP (community acquired pneumonia) 03/04/2024   Debility 03/04/2024   Prolonged QT interval 03/04/2024   Hyponatremia 03/04/2024   Bilateral sensorineural hearing loss 06/26/2022   Stroke (HCC) 09/12/2021   Middle cerebral artery embolism, right 09/12/2021   Corneal guttata of both eyes 08/09/2021   Dry eye syndrome of both lacrimal glands 08/09/2021   Primary open angle glaucoma (POAG) of both eyes, mild stage 08/09/2021   Dysuria 09/24/2020   Prostate nodule 09/09/2020   Restrictive lung disease 08/24/2020   Abnormal chest CT 07/27/2020   Myelodysplastic syndrome (HCC) 08/05/2019   B12 deficiency 11/14/2018   Blepharitis of upper and lower eyelids of both eyes 10/14/2018   Dermatochalasis of both upper eyelids 10/14/2018   Posterior vitreous detachment of left eye 10/14/2018   Prediabetes 10/14/2018   Acquired contracture of bladder neck 04/22/2018   Acquired hypothyroidism 03/13/2018   Allergic rhinitis 03/13/2018   BPH (benign prostatic hyperplasia) 03/13/2018   H/O type B viral hepatitis 03/13/2018   Hearing loss 03/13/2018   Pancytopenia (HCC) 03/13/2018   Essential hypertension 05/01/2016   Atrial fibrillation (HCC) 03/06/2016   Cerebral hemorrhage (HCC) 03/06/2016   Chronic systolic CHF (congestive heart failure) (HCC) 03/06/2016   Other hyperlipidemia 03/06/2016   Past Medical History:  Past Medical History:  Diagnosis Date   Atrial fibrillation (HCC)    CVA (cerebral vascular accident) (HCC)    Hard of hearing    Past Surgical History:  Past Surgical History:  Procedure Laterality Date   IR CT HEAD LTD  09/12/2021   IR CT HEAD LTD  09/12/2021   IR  PERCUTANEOUS ART THROMBECTOMY/INFUSION INTRACRANIAL INC DIAG ANGIO  09/12/2021   RADIOLOGY WITH ANESTHESIA N/A 09/12/2021   Procedure: IR WITH ANESTHESIA;  Surgeon: Radiologist, Medication, MD;  Location: [facility] OR;  Service: Radiology;  Laterality: N/A;   HPI:  89yo male admitted 02/29/24 after a fall. PMH: AFib, hypothyroidism, HLD, decreased hearing acuity. CXR: multifocal PNA, mostly RUL. SLE 2023: decreased memory, wife manages finances/medications. 25/30 SLUMS - difficulty with recall, executive functions.   Assessment / Plan / Recommendation Clinical Impression  Pt seen at bedside for cognitive linguistic evaluation. Oral motor strength and function are adequate. Speech is fully intelligible. Receptive and Expressive Language skills appear to be intact. The Mini-Mental State Examination (MMSE) was administered today. Pt scored 27/28 (written tasks not administered at this time). One point lost on spelling WORLD backward.   Wife was present at bedside, and reports pt made remarkable recovery following CVA in 2023. She feels he is at his baseline level of cognitive linguistic function. He is significantly hard of hearing, however, which impairs effective communication. Pt/wife were receptive to education regarding results today, and that SLP at short term rehab facility may reassess with more in depth testing given level of independence prior to admit. No further acute ST intervention recommended at this time.    SLP Assessment  SLP Recommendation/Assessment: All further Speech Language Pathology needs can be addressed in the next venue of care (if needed) SLP Visit Diagnosis: Cognitive communication deficit (R41.841)     Assistance Recommended at Discharge  Intermittent Supervision/Assistance  Functional Status Assessment Patient has not had a recent decline in their functional status  SLP Evaluation Cognition  Overall Cognitive Status: History of cognitive impairments - at  baseline Arousal/Alertness: Awake/alert Orientation Level: Oriented X4       Comprehension  Auditory Comprehension Overall Auditory Comprehension: Appears within functional limits for tasks assessed Reading Comprehension Reading Status: Within funtional limits    Expression Expression Primary Mode of Expression: Verbal Verbal Expression Overall Verbal Expression: Appears within functional limits for tasks assessed Written Expression Dominant Hand: Right   Oral / Motor  Oral Motor/Sensory Function Overall Oral Motor/Sensory Function: Within functional limits Motor Speech Overall Motor Speech: Appears within functional limits for tasks assessed Intelligibility: Intelligible           Jerrin Recore B. Dory, MSP, CCC-SLP Speech Language Pathologist Office: [phone]  Dory Caprice Daring 03/06/2024, 11:05 AM

## 2024-03-06 NOTE — TOC Progression Note (Addendum)
 Transition of Care [facility]) - Progression Note    Patient Details  Name: Elijah Stone MRN: [id number] Date of Birth: [birth year]  Transition of [facility]) CM/SW Contact  Lendia Dais, [place] Phone Number: 03/06/2024, 4:11 PM  Clinical Narrative:  CSW spoke to pt's son Debby via and Pt and pt's family have chosen [facility]. Shara is pending, shara ID is [id number].  CSW informed Charlena that PT stated that it is not medically necessary for the pt to travel by ambulance. Charlena stated they were uncomfortable with transporting the patient and was agreeable to the pt traveling by safe transport. Pt is ambulatory.   CSW will follow up on auth status tomorrow.                      Expected Discharge Plan and Services                                               Social Drivers of Health (SDOH) Interventions SDOH Screenings   Food Insecurity: No Food Insecurity (03/05/2024)  Housing: Low Risk  (03/05/2024)  Transportation Needs: No Transportation Needs (03/05/2024)  Utilities: Not At Risk (03/05/2024)  Social Connections: Unknown (03/05/2024)  Tobacco Use: Medium Risk (03/03/2024)   Received from [facility]    Readmission Risk Interventions     No data to display

## 2024-03-06 NOTE — Progress Notes (Signed)
 PROGRESS NOTE    Elijah Stone  FMW:[id number] DOB: 02-01-1931 DOA: 03/04/2024 PCP: Elijah Hansel Atlas, MD   Brief Narrative:  Elijah Stone is a 89 y.o. male with medical history significant of   a.fib on eliquis  status post Watchman device, hypothyroidism, HLD systolic CHF EF 25%, Presented with  generalized weakness, felt too weak to get off the commode and had a  fall hitting head while on eliquis .  Endorsed having cough and fever at home associated with chills. he had 3 teeth extracted on 7 November but otherwise have not had any respiratory symptoms no urinary complaints he did do COVID test at home which was negative. At the provider office blood pressure was 97/50 UA showed no evidence of UTI. Chest x-ray showed possible multifocal pneumonia He was started on Levaquin 750 mg daily. Of note patient have had some pruritus over the past few weeks to months he was found to have elevated LFTs and particularly alk phos and GGT with ultrasound showing liver disease and referred to gastroenterology. Hepatic serologies negative for hepatitis AMA is normal He was started on cholestyramine 4 g twice a day  Assessment & Plan:   Principal Problem:   CAP (community acquired pneumonia) Active Problems:   Acquired hypothyroidism   Atrial fibrillation (HCC)   Chronic systolic CHF (congestive heart failure) (HCC)   Essential hypertension   Debility   Prolonged QT interval   Hyponatremia   Malnutrition of moderate degree  Community-acquired pneumonia: Chest x-ray shows multifocal pneumonia, mostly right upper lobe.  Patient has history of cough.  Patient was afebrile, did not have any fever as mentioned in H&P.  Does not have any leukocytosis.  Started on Rocephin, Zithromax as well as doxycycline.  COVID, RSV as well as influenza negative.  Urine antigen for streptococci negative.  Procalcitonin unremarkable.  Will check respiratory viral panel.  Discontinued azithromycin.  Continue Rocephin and  doxycycline.  Generalized weakness/fall: Evaluated by PT OT, SNF recommended.  TOC made aware and working on it.  Chronic normocytic/iron deficiency anemia: Workup indicates iron deficiency anemia.  He is on B12 supplements but B12 is above normal level.  Holding B12 supplement, started on iron supplements.  Paroxysmal atrial fibrillation: Rates controlled.  Continue home medications as well as Eliquis .  Hypophosphatemia: Replenished.  Rechecking today.  Mild hyponatremia: Slight drop in 131.  Checking urine and serum osmolality as well as urine spot sodium.  Essential hypertension: Blood pressure controlled.  Continue Entresto.  Acquired hypothyroidism: Continue Synthroid .  Hyperlipidemia: Resume Crestor .  GERD: Resuming PPI.  Chronic combined systolic and diastolic congestive heart failure: Appears euvolemic.  Continue Entresto.  Does not appear to be on any diuretics.  DVT prophylaxis: apixaban  (ELIQUIS ) tablet 2.5 mg Start: 03/04/24 2300 SCDs Start: 03/04/24 2241   Code Status: Limited: Do not attempt resuscitation (DNR) -DNR-LIMITED -Do Not Intubate/DNI   Family Communication: Wife present at bedside.  Plan of care discussed with patient in length and he/she verbalized understanding and agreed with it.  Status is: Inpatient Remains inpatient appropriate because: Needs SNF.  Medically stable   Estimated body mass index is 22.86 kg/m as calculated from the following:   Height as of this encounter: 6' 1 (1.854 m).   Weight as of this encounter: 78.6 kg.    Nutritional Assessment: Body mass index is 22.86 kg/m.SABRA Seen by dietician.  I agree with the assessment and plan as outlined below: Nutrition Status: Nutrition Problem: Moderate Malnutrition Etiology: chronic illness Signs/Symptoms: mild fat depletion, severe muscle  depletion Interventions: Ensure Enlive (each supplement provides 350kcal and 20 grams of protein)  . Skin Assessment: I have examined the patient's  skin and I agree with the wound assessment as performed by the wound care RN as outlined below:    Consultants:  None  Procedures:  None  Antimicrobials:  Anti-infectives (From admission, onward)    Start     Dose/Rate Route Frequency Ordered Stop   03/06/24 0830  doxycycline (VIBRA-TABS) tablet 100 mg        100 mg Oral 2 times daily 03/06/24 0730     03/05/24 2100  azithromycin (ZITHROMAX) 500 mg in sodium chloride  0.9 % 250 mL IVPB  Status:  Discontinued        500 mg 250 mL/hr over 60 Minutes Intravenous Every 24 hours 03/04/24 2112 03/05/24 0753   03/05/24 1900  cefTRIAXone (ROCEPHIN) 2 g in sodium chloride  0.9 % 100 mL IVPB        2 g 200 mL/hr over 30 Minutes Intravenous Every 24 hours 03/04/24 2112 03/10/24 1859   03/04/24 2230  doxycycline (VIBRAMYCIN) 100 mg in sodium chloride  0.9 % 250 mL IVPB  Status:  Discontinued        100 mg 125 mL/hr over 120 Minutes Intravenous 2 times daily 03/04/24 2200 03/06/24 0730   03/04/24 2100  cefTRIAXone (ROCEPHIN) 1 g in sodium chloride  0.9 % 100 mL IVPB        1 g 200 mL/hr over 30 Minutes Intravenous  Once 03/04/24 2047 03/04/24 2331   03/04/24 2100  azithromycin (ZITHROMAX) 500 mg in sodium chloride  0.9 % 250 mL IVPB  Status:  Discontinued        500 mg 250 mL/hr over 60 Minutes Intravenous  Once 03/04/24 2047 03/04/24 2200         Subjective: Patient seen and examined, wife at the bedside.  Patient said that he had great night, he is not hurting in the ribs anymore today and he feels fresh and much better and that he is also liking the food.  Overall he feels that he has had tremendous recovery.  Wife also agreed to that.  However wife overly concerned about multiple things, asked several questions which I answered and spent about 25 minutes talking to her.  She was extremely concerned about mild hyponatremia.  Reassured her that we are going to do workup and mild asymptomatic hyponatremia of such a level is not much concerning.   The plan to repeat tomorrow.  Objective: Vitals:   03/05/24 0723 03/05/24 1651 03/05/24 2034 03/06/24 0527  BP: (!) 147/93 (!) 141/81 138/74 103/69  Pulse: 100 85 77 87  Resp: (!) 22 18 18 17   Temp: 97.8 F (36.6 C) 98.1 F (36.7 C) 98.3 F (36.8 C) 97.9 F (36.6 C)  TempSrc: Oral     SpO2: 100% 100% 99% 99%  Weight:      Height:        Intake/Output Summary (Last 24 hours) at 03/06/2024 0744 Last data filed at 03/06/2024 0600 Gross per 24 hour  Intake 1690.43 ml  Output 950 ml  Net 740.43 ml   Filed Weights   03/04/24 2003 03/04/24 2312  Weight: 74.8 kg 78.6 kg    Examination:  General exam: Appears calm and comfortable  Respiratory system: Clear to auscultation. Respiratory effort normal. Cardiovascular system: S1 & S2 heard, RRR. No JVD, murmurs, rubs, gallops or clicks. No pedal edema. Gastrointestinal system: Abdomen is nondistended, soft and nontender. No organomegaly or  masses felt. Normal bowel sounds heard. Central nervous system: Alert and oriented. No focal neurological deficits. Extremities: Symmetric 5 x 5 power. Skin: No rashes, lesions or ulcers.  Psychiatry: Judgement and insight appear normal. Mood & affect appropriate.   Data Reviewed: I have personally reviewed following labs and imaging studies  CBC: Recent Labs  Lab 03/04/24 1855 03/04/24 1911 03/05/24 0103  WBC 7.4  --  7.7  HGB 11.4* 12.9* 10.4*  HCT 35.0* 38.0* 31.2*  MCV 94.6  --  94.5  PLT 192  --  179   Basic Metabolic Panel: Recent Labs  Lab 03/04/24 1855 03/04/24 1911 03/05/24 0103  NA 131* 133* 134*  K 4.2 4.3 4.4  CL 95* 97* 99  CO2 24  --  24  GLUCOSE 120* 114* 204*  BUN 15 16 13   CREATININE 0.95 1.00 0.93  CALCIUM  9.1  --  8.8*  MG 2.0  --  2.0  PHOS 2.3*  --  2.3*   GFR: Estimated Creatinine Clearance: 57.5 mL/min (by C-G formula based on SCr of 0.93 mg/dL). Liver Function Tests: Recent Labs  Lab 03/04/24 1855 03/05/24 0103  AST 29 30  ALT 32 27   ALKPHOS 314* 268*  BILITOT 0.4 0.7  PROT 7.5 6.8  ALBUMIN 3.3* 3.0*   No results for input(s): LIPASE, AMYLASE in the last 168 hours. No results for input(s): AMMONIA in the last 168 hours. Coagulation Profile: Recent Labs  Lab 03/04/24 1855  INR 1.2   Cardiac Enzymes: Recent Labs  Lab 03/04/24 1855  CKTOTAL 138   BNP (last 3 results) No results for input(s): PROBNP in the last 8760 hours. HbA1C: No results for input(s): HGBA1C in the last 72 hours. CBG: No results for input(s): GLUCAP in the last 168 hours. Lipid Profile: No results for input(s): CHOL, HDL, LDLCALC, TRIG, CHOLHDL, LDLDIRECT in the last 72 hours. Thyroid Function Tests: No results for input(s): TSH, T4TOTAL, FREET4, T3FREE, THYROIDAB in the last 72 hours. Anemia Panel: Recent Labs    03/04/24 1855 03/05/24 0103  VITAMINB12  --  1,130*  FOLATE  --  >20.0  FERRITIN  --  65  TIBC  --  325  IRON  --  19*  RETICCTPCT 1.4  --    Sepsis Labs: Recent Labs  Lab 03/04/24 1911 03/04/24 2116 03/04/24 2117  PROCALCITON  --  <0.10  --   LATICACIDVEN 0.9  --  0.7    Recent Results (from the past 240 hours)  Resp panel by RT-PCR (RSV, Flu A&B, Covid) Anterior Nasal Swab     Status: None   Collection Time: 03/04/24  8:59 PM   Specimen: Anterior Nasal Swab  Result Value Ref Range Status   SARS Coronavirus 2 by RT PCR NEGATIVE NEGATIVE Final   Influenza A by PCR NEGATIVE NEGATIVE Final   Influenza B by PCR NEGATIVE NEGATIVE Final    Comment: (NOTE) The Xpert Xpress SARS-CoV-2/FLU/RSV plus assay is intended as an aid in the diagnosis of influenza from Nasopharyngeal swab specimens and should not be used as a sole basis for treatment. Nasal washings and aspirates are unacceptable for Xpert Xpress SARS-CoV-2/FLU/RSV testing.  Fact Sheet for Patients: bloggercourse.com  Fact Sheet for Healthcare  Providers: seriousbroker.it  This test is not yet approved or cleared by the [place]  FDA and has been authorized for detection and/or diagnosis of SARS-CoV-2 by FDA under an Emergency Use Authorization (EUA). This EUA will remain in effect (meaning this test can be used) for  the duration of the COVID-19 declaration under Section 564(b)(1) of the Act, 21 U.S.C. section 360bbb-3(b)(1), unless the authorization is terminated or revoked.     Resp Syncytial Virus by PCR NEGATIVE NEGATIVE Final    Comment: (NOTE) Fact Sheet for Patients: bloggercourse.com  Fact Sheet for Healthcare Providers: seriousbroker.it  This test is not yet approved or cleared by the [place]  FDA and has been authorized for detection and/or diagnosis of SARS-CoV-2 by FDA under an Emergency Use Authorization (EUA). This EUA will remain in effect (meaning this test can be used) for the duration of the COVID-19 declaration under Section 564(b)(1) of the Act, 21 U.S.C. section 360bbb-3(b)(1), unless the authorization is terminated or revoked.  Performed at [facility], [address], [place], [place] [address]   Blood culture (routine x 2)     Status: None (Preliminary result)   Collection Time: 03/04/24  9:16 PM   Specimen: BLOOD  Result Value Ref Range Status   Specimen Description BLOOD RIGHT ANTECUBITAL  Final   Special Requests   Final    BOTTLES DRAWN AEROBIC AND ANAEROBIC Blood Culture adequate volume   Culture   Final    NO GROWTH < 12 HOURS Performed at [facility], [address] [place], [place] [address]    Report Status PENDING  Incomplete  Blood culture (routine x 2)     Status: None (Preliminary result)   Collection Time: 03/04/24  9:16 PM   Specimen: BLOOD  Result Value Ref Range Status   Specimen Description BLOOD SITE NOT SPECIFIED  Final   Special Requests   Final    BOTTLES DRAWN  AEROBIC AND ANAEROBIC Blood Culture adequate volume   Culture   Final    NO GROWTH < 12 HOURS Performed at [facility], [address], [place], [place] [address]    Report Status PENDING  Incomplete     Radiology Studies: CT L-SPINE NO CHARGE Result Date: 03/04/2024 EXAM: CT OF THE LUMBAR SPINE WITHOUT CONTRAST 03/04/2024 07:48:00 PM TECHNIQUE: CT of the lumbar spine was performed without the administration of intravenous contrast. Multiplanar reformatted images are provided for review. Automated exposure control, iterative reconstruction, and/or weight based adjustment of the mA/kV was utilized to reduce the radiation dose to as low as reasonably achievable. COMPARISON: None available. CLINICAL HISTORY: FINDINGS: BONES AND ALIGNMENT: Normal vertebral body heights. No acute fracture or suspicious bone lesion. Mild lumbar levoscoliosis. DEGENERATIVE CHANGES: Mild multilevel degenerative changes. SOFT TISSUES: No acute abnormality. IMPRESSION: 1. No acute findings. Electronically signed by: Pinkie Pebbles MD 03/04/2024 08:03 PM EST RP Workstation: [id number]   CT T-SPINE NO CHARGE Result Date: 03/04/2024 EXAM: CT THORACIC SPINE WITHOUT CONTRAST 03/04/2024 07:48:00 PM TECHNIQUE: CT of the thoracic spine was performed without the administration of intravenous contrast. Multiplanar reformatted images are provided for review. Automated exposure control, iterative reconstruction, and/or weight based adjustment of the mA/kV was utilized to reduce the radiation dose to as low as reasonably achievable. COMPARISON: None available. CLINICAL HISTORY: FINDINGS: BONES AND ALIGNMENT: Normal vertebral body heights. No acute fracture or suspicious bone lesion. Normal alignment. DEGENERATIVE CHANGES: Mild degenerative changes of the lower thoracic spine. SOFT TISSUES: No acute abnormality. IMPRESSION: 1. No acute abnormality of the thoracic spine. Electronically signed by: Pinkie Pebbles MD 03/04/2024 08:02  PM EST RP Workstation: [id number]   CT Cervical Spine Wo Contrast Result Date: 03/04/2024 EXAM: CT CERVICAL SPINE WITHOUT CONTRAST 03/04/2024 07:48:00 PM TECHNIQUE: CT of the cervical spine was performed  without the administration of intravenous contrast. Multiplanar reformatted images are provided for review. Automated exposure control, iterative reconstruction, and/or weight based adjustment of the mA/kV was utilized to reduce the radiation dose to as low as reasonably achievable. COMPARISON: None available. CLINICAL HISTORY: Ataxia, cervical trauma. FINDINGS: CERVICAL SPINE: BONES AND ALIGNMENT: No acute fracture or traumatic malalignment. DEGENERATIVE CHANGES: Mild degenerative changes of the mid cervical spine. SOFT TISSUES: No prevertebral soft tissue swelling. IMPRESSION: 1. No acute abnormality of the cervical spine. Electronically signed by: Pinkie Pebbles MD 03/04/2024 08:01 PM EST RP Workstation: [id number]   CT Angio Chest/Abd/Pel for Dissection W and/or Wo Contrast Result Date: 03/04/2024 EXAM: CTA CHEST, ABDOMEN AND PELVIS WITHOUT AND WITH CONTRAST 03/04/2024 07:48:00 PM TECHNIQUE: CTA of the chest was performed without and with the administration of 85 mL of intravenous iohexol  (OMNIPAQUE ) 350 MG/ML injection. CTA of the abdomen and pelvis was performed without and with the administration of 85 mL of intravenous iohexol  (OMNIPAQUE ) 350 MG/ML injection. Multiplanar reformatted images are provided for review. MIP images are provided for review. Automated exposure control, iterative reconstruction, and/or weight based adjustment of the mA/kV was utilized to reduce the radiation dose to as low as reasonably achievable. COMPARISON: None available. CLINICAL HISTORY: Acute aortic syndrome (AAS) suspected. FINDINGS: VASCULATURE: AORTA: No thoracic aortic aneurysm or dissection. Thoracic aortic atherosclerosis. No abdominal aortic aneurysm or dissection. Atherosclerotic calcifications of the  abdominal aorta and branch vessels, although patent. PULMONARY ARTERIES: No central pulmonary embolism with the limits of this exam. GREAT VESSELS OF AORTIC ARCH: No acute finding. No dissection. No arterial occlusion or significant stenosis. CELIAC TRUNK: No acute finding. No occlusion or significant stenosis. SUPERIOR MESENTERIC ARTERY: No acute finding. No occlusion or significant stenosis. INFERIOR MESENTERIC ARTERY: No acute finding. No occlusion or significant stenosis. RENAL ARTERIES: No acute finding. No occlusion or significant stenosis. ILIAC ARTERIES: No acute finding. No occlusion or significant stenosis. CHEST: MEDIASTINUM: No mediastinal lymphadenopathy. Mild cardiomegaly. Left atrial appendage occluder. Mild coronary atherosclerosis of the LAD and right coronary artery. The pericardium demonstrates no acute abnormality. LUNGS AND PLEURA: Multifocal subpleural patchy opacities, right upper lobe predominant, suggesting multifocal pneumonia although underlying post-infectious/inflammatory scarring is possible. Biapical pleural parenchymal scarring. Trace right pleural effusion. No pneumothorax. THORACIC BONES AND SOFT TISSUES: Mild degenerative changes of the lower thoracic spine. No acute soft tissue abnormality. ABDOMEN AND PELVIS: LIVER: The liver is unremarkable. GALLBLADDER AND BILE DUCTS: Gallbladder is unremarkable. No biliary ductal dilatation. SPLEEN: The spleen is unremarkable. PANCREAS: The pancreas is unremarkable. ADRENAL GLANDS: Bilateral adrenal glands demonstrate no acute abnormality. KIDNEYS, URETERS AND BLADDER: 4 mm nonobstructing right lower pole renal calculus (image 130). Bilateral renal cysts, measuring up to 3.5 cm in the left upper kidney, benign (Bosniak 1). No follow-up is recommended. No hydronephrosis. No perinephric or periureteral stranding. Urinary bladder is unremarkable. GI AND BOWEL: Stomach and duodenal sweep demonstrate no acute abnormality. Sigmoid diverticulosis,  without evidence of diverticulitis. There is no bowel obstruction. No abnormal bowel wall thickening or distension. REPRODUCTIVE: Diminutive prostate. PERITONEUM AND RETROPERITONEUM: No ascites or free air. LYMPH NODES: No lymphadenopathy. ABDOMINAL BONES AND SOFT TISSUES: Mild degenerative changes of the upper lumbar spine. Mild lumbar levoscoliosis. No acute soft tissue abnormality. IMPRESSION: 1. No thoracoabdominal aortic aneurysm or dissection. 2. No central pulmonary embolism. 3. Suspected multifocal pneumonia, right upper lobe predominant. Underlying postinfectious/inflammatory scarring is possible. 4. Trace right pleural effusion. 5. Additional ancillary findings, as above. Electronically signed by: Pinkie Pebbles MD 03/04/2024 08:00 PM EST RP Workstation:  [id number]   CT HEAD WO CONTRAST Result Date: 03/04/2024 EXAM: CT HEAD WITHOUT CONTRAST 03/04/2024 07:48:00 PM TECHNIQUE: CT of the head was performed without the administration of intravenous contrast. Automated exposure control, iterative reconstruction, and/or weight based adjustment of the mA/kV was utilized to reduce the radiation dose to as low as reasonably achievable. COMPARISON: 12/13/2023 CLINICAL HISTORY: Head trauma, moderate-severe. FINDINGS: BRAIN AND VENTRICLES: No acute hemorrhage. No evidence of acute infarct. No hydrocephalus. No extra-axial collection. No mass effect or midline shift. Stable atrophy. Periventricular and deep white matter hypodensity typical of chronic small vessel ischemia. Unchanged chronic gyriform mineralized densities in the occipital lobes. 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 Intracranial atherosclerosis. ORBITS: No acute abnormality. SINUSES: No acute abnormality. SOFT TISSUES AND SKULL: No acute soft tissue abnormality. No skull fracture. IMPRESSION: 1. No acute intracranial abnormality. 2. Prior infarcts and additional stable ancillary findings, as above. Electronically signed by:  Pinkie Pebbles MD 03/04/2024 07:55 PM EST RP Workstation: [id number]   DG Pelvis Portable Result Date: 03/04/2024 EXAM: 1 VIEW(S) XRAY OF THE PELVIS 03/04/2024 07:04:56 PM COMPARISON: None available. CLINICAL HISTORY: Trauma FINDINGS: BONES AND JOINTS: No acute fracture. No focal osseous lesion. No joint dislocation. Mild degenerative changes at L4-L5. SOFT TISSUES: The soft tissues are unremarkable. IMPRESSION: 1. No evidence of acute traumatic injury. Electronically signed by: Pinkie Pebbles MD 03/04/2024 07:11 PM EST RP Workstation: [id number]   DG Chest Port 1 View Result Date: 03/04/2024 EXAM: 1 VIEW(S) XRAY OF THE CHEST 03/04/2024 07:04:56 PM COMPARISON: 12/26/2021. CLINICAL HISTORY: Trauma FINDINGS: LINES, TUBES AND DEVICES: Cardiac device overlying the left upper heart. LUNGS AND PLEURA: Calcified left upper lobe granuloma. Multifocal patchy opacities in the bilateral upper lobes, suspicious for pneumonia. Mild subpleural scarring at the lung bases. Biapical pleural parenchymal scarring. No pulmonary edema. No pleural effusion. No pneumothorax. HEART AND MEDIASTINUM: No acute abnormality of the cardiac and mediastinal silhouettes. BONES AND SOFT TISSUES: No acute osseous abnormality. IMPRESSION: 1. Multifocal patchy opacities in the bilateral upper lobes, suspicious for pneumonia. Electronically signed by: Pinkie Pebbles MD 03/04/2024 07:10 PM EST RP Workstation: [id number]    Scheduled Meds:  apixaban   2.5 mg Oral BID   cholecalciferol  1,000 Units Oral Daily   cholestyramine light  4 g Oral BID   cycloSPORINE   1 drop Both Eyes BID   doxycycline  100 mg Oral BID   feeding supplement  237 mL Oral BID BM   ferrous sulfate  325 mg Oral BID WC   latanoprost   1 drop Both Eyes QHS   levothyroxine   88 mcg Oral QAC breakfast   loratadine  10 mg Oral QPM   pantoprazole   40 mg Oral Daily   polyethylene glycol  17 g Oral Daily   rosuvastatin   20 mg Oral Daily   sacubitril-valsartan  1  tablet Oral QHS   Continuous Infusions:  cefTRIAXone (ROCEPHIN)  IV 200 mL/hr at 03/05/24 1828     LOS: 2 days   Fredia Skeeter, MD [facility]  03/06/2024, 7:44 AM   *Please note that this is a verbal dictation therefore any spelling or grammatical errors are due to the Dragon Medical One system interpretation.  Please page via Amion and do not message via secure chat for urgent patient care matters. Secure chat can be used for non urgent patient care matters.  How to contact the [facility] Attending or Consulting provider 7A - 7P or covering provider during after hours 7P -7A, for this patient?  Check  the care team in [facility] and look for a) attending/consulting [facility] provider listed and b) the [facility] team listed. Page or secure chat 7A-7P. Log into [email or web address] and use [facility] universal password to access. If you do not have the password, please contact the hospital operator. Locate the [facility] provider you are looking for under [facility] and page to a number that you can be directly reached. If you still have difficulty reaching the provider, please page the [facility] (Director on Call) for the Hospitalists listed on amion for assistance.

## 2024-03-06 NOTE — Telephone Encounter (Signed)
 Requested Prescriptions   Pending Prescriptions Disp Refills   apixaban  (ELIQUIS ) 2.5 MG TABS tablet 180 tablet 1    Sig: Take 1 tablet (2.5 mg total) by mouth 2 (two) times daily.   Prescription refill request for Eliquis  received. Last office visit:06/26/22 w/ Dr. Rosemarie at [facility]  Scr:0.93 03/06/24 Age: 89yr old male Weight:78.6kg  PT WOULD QUALIFY FOR 5MG  BID BASED ON CRITERIA. HE HASN'T SEEN [facility] DR. IN PAST YR

## 2024-03-06 NOTE — Progress Notes (Signed)
 Physical Therapy Treatment Patient Details Name: Elijah Stone MRN: [id number] DOB: 12/11/1930 Today's Date: 03/06/2024   History of Present Illness Elijah Stone is a 89 y.o. male who presented with a fall and generalized weakness; recently diagnosed with pneumonia. PMHx: a.fib on eliquis  status post Watchman device, hypothyroidism, HLD systolic CHF EF 25%   PT Comments  Pt received in supine and eager for mobility. Pt improved in today's session by needing less assistance for all mobility. Pt required MinA/CGA for bed mobility, to stand, and to ambulate with RW. Pt was able to tolerate two short gait distances with one seated rest break in between due to fatigue. Pt requested to return to supine after second gait distance due to fatigue. Continue to recommend <3hrs post acute rehab to work on independence with mobility and to improve activity tolerance. Acute PT to follow.     If plan is discharge home, recommend the following: A lot of help with walking and/or transfers;A lot of help with bathing/dressing/bathroom;Assistance with cooking/housework;Assist for transportation   Can travel by private vehicle     Yes  Equipment Recommendations  Other (comment) (Can consider rollator, grab bars for bathroom)       Precautions / Restrictions Precautions Precautions: Fall Restrictions Weight Bearing Restrictions Per Provider Order: No     Mobility  Bed Mobility Overal bed mobility: Needs Assistance Bed Mobility: Supine to Sit, Sit to Supine    Supine to sit: Min assist Sit to supine: Contact guard assist   General bed mobility comments: slight MinA for trunk raise. CGA for safety for return to supine    Transfers Overall transfer level: Needs assistance Equipment used: Rolling walker (2 wheels) Transfers: Sit to/from Stand Sit to Stand: Min assist, Contact guard assist    General transfer comment: MinA/CGA for slight steadying assist with raise    Ambulation/Gait Ambulation/Gait  assistance: Contact guard assist, Min assist Gait Distance (Feet): 100 Feet (x2) Assistive device: Rolling walker (2 wheels) Gait Pattern/deviations: Step-through pattern, Decreased step length - right, Decreased step length - left Gait velocity: decr     General Gait Details: cues for RW proximity with  occasional MinA for RW management      Balance Overall balance assessment: Needs assistance, History of Falls Sitting-balance support: Feet supported, No upper extremity supported Sitting balance-Leahy Scale: Fair     Standing balance support: Bilateral upper extremity supported, During functional activity, Reliant on assistive device for balance Standing balance-Leahy Scale: Poor Standing balance comment: Dependent on UE support       Communication Communication Communication: Impaired Factors Affecting Communication: Hearing impaired  Cognition Arousal: Alert Behavior During Therapy: WFL for tasks assessed/performed   PT - Cognitive impairments: No apparent impairments    PT - Cognition Comments: followed tasks and answered questions appropriately Following commands: Intact      Cueing Cueing Techniques: Verbal cues, Gestural cues, Tactile cues  Exercises Other Exercises Other Exercises: x5 bridges in supine        Pertinent Vitals/Pain Pain Assessment Pain Assessment: No/denies pain     PT Goals (current goals can now be found in the care plan section) Acute Rehab PT Goals PT Goal Formulation: With patient/family Time For Goal Achievement: 03/19/24 Potential to Achieve Goals: Good Progress towards PT goals: Progressing toward goals    Frequency    Min 2X/week       AM-PAC PT 6 Clicks Mobility   Outcome Measure  Help needed turning from your back to your side while in a flat  bed without using bedrails?: A Little Help needed moving from lying on your back to sitting on the side of a flat bed without using bedrails?: A Little Help needed moving to  and from a bed to a chair (including a wheelchair)?: A Little Help needed standing up from a chair using your arms (e.g., wheelchair or bedside chair)?: A Little Help needed to walk in hospital room?: A Little Help needed climbing 3-5 steps with a railing? : A Lot 6 Click Score: 17    End of Session Equipment Utilized During Treatment: Gait belt Activity Tolerance: Patient tolerated treatment well Patient left: in bed;with call bell/phone within reach;with bed alarm set Nurse Communication: Mobility status PT Visit Diagnosis: Unsteadiness on feet (R26.81);Other abnormalities of gait and mobility (R26.89);History of falling (Z91.81)     Time: 1419-1440 PT Time Calculation (min) (ACUTE ONLY): 21 min  Charges:    $Gait Training: 8-22 mins PT General Charges $$ ACUTE PT VISIT: 1 Visit                    Kate ORN, PT, DPT Secure Chat Preferred  Rehab Office [phone]   Kate BRAVO Wendolyn 03/06/2024, 3:30 PM

## 2024-03-06 NOTE — Progress Notes (Signed)
PHARMACIST - PHYSICIAN COMMUNICATION DR:   Doristine Bosworth CONCERNING: Antibiotic IV to Oral Route Change Policy  RECOMMENDATION: This patient is receiving doxycycline by the intravenous route.  Based on criteria approved by the Pharmacy and Therapeutics Committee, the antibiotic(s) is/are being converted to the equivalent oral dose form(s).   DESCRIPTION: These criteria include:  Patient being treated for a respiratory tract infection, urinary tract infection, cellulitis or clostridium difficile associated diarrhea if on metronidazole  The patient is not neutropenic and does not exhibit a GI malabsorption state  The patient is eating (either orally or via tube) and/or has been taking other orally administered medications for a least 24 hours  The patient is improving clinically and has a Tmax < 100.5  If you have questions about this conversion, please contact the Pharmacy Department  []   [phone] )  [facility] []   [phone] )  [facility] [x]   [phone] )  [facility] []   [phone] )  [facility] []   [phone] )  [facility]

## 2024-03-07 DIAGNOSIS — M6281 Muscle weakness (generalized): Secondary | ICD-10-CM | POA: Insufficient documentation

## 2024-03-07 DIAGNOSIS — I639 Cerebral infarction, unspecified: Secondary | ICD-10-CM | POA: Insufficient documentation

## 2024-03-07 DIAGNOSIS — W19XXXA Unspecified fall, initial encounter: Secondary | ICD-10-CM | POA: Insufficient documentation

## 2024-03-07 MED ORDER — AMOXICILLIN-POT CLAVULANATE 875-125 MG PO TABS: 1.0000 | ORAL_TABLET | Freq: Two times a day (BID) | ORAL | Status: AC

## 2024-03-07 MED ORDER — FERROUS SULFATE 325 (65 FE) MG PO TABS: 325.0000 mg | ORAL_TABLET | Freq: Two times a day (BID) | ORAL | Status: AC

## 2024-03-07 MED ORDER — AMOXICILLIN-POT CLAVULANATE 875-125 MG PO TABS
1.0000 | ORAL_TABLET | Freq: Two times a day (BID) | ORAL | Status: DC
  Administered 2024-03-07: 1 via ORAL
  Filled 2024-03-07: qty 1

## 2024-03-07 MED ORDER — DOCUSATE SODIUM 100 MG PO CAPS
100.0000 mg | ORAL_CAPSULE | Freq: Once | ORAL | Status: DC
  Filled 2024-03-07: qty 1

## 2024-03-07 NOTE — Progress Notes (Signed)
 Consult to change ceftriaxone/doxy to augmentin for 5 more days per Dr Jillian.   Augmentin 875mg  PO BID x5d  Sergio Batch, PharmD, [place], AAHIVP, CPP Infectious Disease Pharmacist 03/07/2024 10:37 AM

## 2024-03-07 NOTE — Discharge Summary (Signed)
 Physician Discharge Summary  Elijah Stone FMW:[id number] DOB: 07/29/1930 DOA: 03/04/2024  PCP: Nikki Hansel Atlas, MD  Admit date: 03/04/2024 Discharge date: 03/07/2024  Admitted From: Home Disposition:  Home  Discharge Condition:Stable CODE STATUS:DNR Diet recommendation: Heart Healthy   Brief/Interim Summary: Elijah Stone is a 89 y.o. male with medical history significant of   a.fib on eliquis  status post Watchman device, hypothyroidism, HLD systolic CHF EF 25%, Presented with  generalized weakness, felt too weak to get off the commode and had a  fall hitting head while on eliquis .  Endorsed having cough and fever at home associated with chills. he had 3 teeth extracted on 7 November but otherwise have not had any respiratory symptoms no urinary complaints he did do COVID test at home which was negative. At the provider office blood pressure was 97/50 UA showed no evidence of UTI. Chest x-ray showed possible multifocal pneumonia.He was started on Levaquin 750 mg daily. Of note patient have had some pruritus over the past few weeks to months he was found to have elevated LFTs and particularly alk phos and GGT with ultrasound showing liver disease and referred to gastroenterology.  Started on cholestyramine.  On presentation, chest ray showed multifocal pneumonia, mostly on the right upper lobe.  He was started on ceftriaxone and doxycycline.  Hospital course remained stable.  He clinically improved.  Currently he is on room air.  Denies any shortness of breath or cough.  PT recommended SNF on discharge.  Medically discharge for discharge to SNF today.  Following problems were addressed during the hospitalization:  Community-acquired pneumonia: Chest x-ray showed multifocal pneumonia, mostly right upper lobe.  Does not have any leukocytosis.   COVID, RSV as well as influenza negative.  Urine antigen for streptococci negative.  Procalcitonin unremarkable.  Has clinically improved.  No shortness of  breath or cough today.  Antibiotic changed  to Augmentin.   Generalized weakness/fall: Evaluated by PT OT, SNF recommended.    Chronic normocytic/iron deficiency anemia: Workup indicates iron deficiency anemia.  He is on B12 supplements but B12 is above normal level.  Holding B12 supplement, started on iron supplements.   Paroxysmal atrial fibrillation: Rates controlled.  Continue home medications as well as Eliquis .   Hypophosphatemia: Replenished.     Mild hyponatremia: Slight drop in 131.Check BMP in a week   Essential hypertension: Blood pressure controlled.  Continue Entresto.   Acquired hypothyroidism: Continue Synthroid .   Hyperlipidemia: Resume Crestor .   GERD: Resuming PPI.   Chronic combined systolic and diastolic congestive heart failure: Appears euvolemic.  Continue Entresto.  Does not appear to be on any diuretics.   Discharge Diagnoses:  Principal Problem:   CAP (community acquired pneumonia) Active Problems:   Acquired hypothyroidism   Atrial fibrillation (HCC)   Chronic systolic CHF (congestive heart failure) (HCC)   Essential hypertension   Debility   Prolonged QT interval   Hyponatremia   Malnutrition of moderate degree    Discharge Instructions  Discharge Instructions     Diet - low sodium heart healthy   Complete by: As directed    Discharge instructions   Complete by: As directed    1)Please take your medications as instructed 2)Do a CBC ,BMP tests in a week   Increase activity slowly   Complete by: As directed       Allergies as of 03/07/2024       Reactions   Codeine    Hallucinations per patient's wife by phone.  Medication List     STOP taking these medications    B-12 500 MCG Tabs   levofloxacin 750 MG tablet Commonly known as: LEVAQUIN       TAKE these medications    acetaminophen  500 MG tablet Commonly known as: TYLENOL  Take 500 mg by mouth at bedtime.   amoxicillin-clavulanate 875-125 MG  tablet Commonly known as: AUGMENTIN Take 1 tablet by mouth every 12 (twelve) hours for 5 days.   apixaban  2.5 MG Tabs tablet Commonly known as: Eliquis  Take 1 tablet (2.5 mg total) by mouth 2 (two) times daily. What changed: See the new instructions.   cholecalciferol 25 MCG (1000 UNIT) tablet Commonly known as: VITAMIN D3 Take 1,000 Units by mouth daily.   cholestyramine 4 g packet Commonly known as: QUESTRAN Take 4 g by mouth See admin instructions. Take 1 packet (4 g total) by mouth 2 (two) times a day. Take one packet twice daily (make sure other drugs are taken either 2 hours prior to cholestyramine or 4 hours after cholestyramine)   cycloSPORINE  0.05 % ophthalmic emulsion Commonly known as: RESTASIS  Place 1 drop into both eyes 2 (two) times daily.   Entresto 24-26 MG Generic drug: sacubitril-valsartan Take 1 tablet by mouth at bedtime.   ferrous sulfate 325 (65 FE) MG tablet Take 1 tablet (325 mg total) by mouth 2 (two) times daily with a meal.   fluocinonide cream 0.05 % Commonly known as: LIDEX Apply 1 application. topically 2 (two) times daily as needed (rash).   fluticasone 50 MCG/ACT nasal spray Commonly known as: FLONASE Place 1 spray into both nostrils daily.   hydrOXYzine 25 MG tablet Commonly known as: ATARAX Take 25 mg by mouth at bedtime. itching   latanoprost  0.005 % ophthalmic solution Commonly known as: XALATAN  Place 1 drop into both eyes at bedtime.   levocetirizine 5 MG tablet Commonly known as: XYZAL Take 5 mg by mouth every evening.   levothyroxine  88 MCG tablet Commonly known as: SYNTHROID  Take 88 mcg by mouth daily before breakfast.   MiraLax  17 GM/SCOOP powder Generic drug: polyethylene glycol powder Take 17 g by mouth daily.   MURO 128 OP Apply 1 drop to eye 2 (two) times daily.   omeprazole 40 MG capsule Commonly known as: PRILOSEC Take 40 mg by mouth daily.   rosuvastatin  20 MG tablet Commonly known as: CRESTOR  Take 1  tablet (20 mg total) by mouth daily.        Contact information for after-discharge care     Destination     [facility] .   Service: Skilled Nursing Contact information: [address] [place]  [address] [phone]                    Allergies  Allergen Reactions   Codeine     Hallucinations per patient's wife by phone.    Consultations: None   Procedures/Studies: CT L-SPINE NO CHARGE Result Date: 03/04/2024 EXAM: CT OF THE LUMBAR SPINE WITHOUT CONTRAST 03/04/2024 07:48:00 PM TECHNIQUE: CT of the lumbar spine was performed without the administration of intravenous contrast. Multiplanar reformatted images are provided for review. Automated exposure control, iterative reconstruction, and/or weight based adjustment of the mA/kV was utilized to reduce the radiation dose to as low as reasonably achievable. COMPARISON: None available. CLINICAL HISTORY: FINDINGS: BONES AND ALIGNMENT: Normal vertebral body heights. No acute fracture or suspicious bone lesion. Mild lumbar levoscoliosis. DEGENERATIVE CHANGES: Mild multilevel degenerative changes. SOFT TISSUES: No acute abnormality. IMPRESSION:  1. No acute findings. Electronically signed by: Pinkie Pebbles MD 03/04/2024 08:03 PM EST RP Workstation: [id number]   CT T-SPINE NO CHARGE Result Date: 03/04/2024 EXAM: CT THORACIC SPINE WITHOUT CONTRAST 03/04/2024 07:48:00 PM TECHNIQUE: CT of the thoracic spine was performed without the administration of intravenous contrast. Multiplanar reformatted images are provided for review. Automated exposure control, iterative reconstruction, and/or weight based adjustment of the mA/kV was utilized to reduce the radiation dose to as low as reasonably achievable. COMPARISON: None available. CLINICAL HISTORY: FINDINGS: BONES AND ALIGNMENT: Normal vertebral body heights. No acute fracture or suspicious bone lesion. Normal alignment. DEGENERATIVE CHANGES: Mild degenerative  changes of the lower thoracic spine. SOFT TISSUES: No acute abnormality. IMPRESSION: 1. No acute abnormality of the thoracic spine. Electronically signed by: Pinkie Pebbles MD 03/04/2024 08:02 PM EST RP Workstation: [id number]   CT Cervical Spine Wo Contrast Result Date: 03/04/2024 EXAM: CT CERVICAL SPINE WITHOUT CONTRAST 03/04/2024 07:48:00 PM TECHNIQUE: CT of the cervical spine was performed without the administration of intravenous contrast. Multiplanar reformatted images are provided for review. Automated exposure control, iterative reconstruction, and/or weight based adjustment of the mA/kV was utilized to reduce the radiation dose to as low as reasonably achievable. COMPARISON: None available. CLINICAL HISTORY: Ataxia, cervical trauma. FINDINGS: CERVICAL SPINE: BONES AND ALIGNMENT: No acute fracture or traumatic malalignment. DEGENERATIVE CHANGES: Mild degenerative changes of the mid cervical spine. SOFT TISSUES: No prevertebral soft tissue swelling. IMPRESSION: 1. No acute abnormality of the cervical spine. Electronically signed by: Pinkie Pebbles MD 03/04/2024 08:01 PM EST RP Workstation: [id number]   CT Angio Chest/Abd/Pel for Dissection W and/or Wo Contrast Result Date: 03/04/2024 EXAM: CTA CHEST, ABDOMEN AND PELVIS WITHOUT AND WITH CONTRAST 03/04/2024 07:48:00 PM TECHNIQUE: CTA of the chest was performed without and with the administration of 85 mL of intravenous iohexol  (OMNIPAQUE ) 350 MG/ML injection. CTA of the abdomen and pelvis was performed without and with the administration of 85 mL of intravenous iohexol  (OMNIPAQUE ) 350 MG/ML injection. Multiplanar reformatted images are provided for review. MIP images are provided for review. Automated exposure control, iterative reconstruction, and/or weight based adjustment of the mA/kV was utilized to reduce the radiation dose to as low as reasonably achievable. COMPARISON: None available. CLINICAL HISTORY: Acute aortic syndrome (AAS) suspected.  FINDINGS: VASCULATURE: AORTA: No thoracic aortic aneurysm or dissection. Thoracic aortic atherosclerosis. No abdominal aortic aneurysm or dissection. Atherosclerotic calcifications of the abdominal aorta and branch vessels, although patent. PULMONARY ARTERIES: No central pulmonary embolism with the limits of this exam. GREAT VESSELS OF AORTIC ARCH: No acute finding. No dissection. No arterial occlusion or significant stenosis. CELIAC TRUNK: No acute finding. No occlusion or significant stenosis. SUPERIOR MESENTERIC ARTERY: No acute finding. No occlusion or significant stenosis. INFERIOR MESENTERIC ARTERY: No acute finding. No occlusion or significant stenosis. RENAL ARTERIES: No acute finding. No occlusion or significant stenosis. ILIAC ARTERIES: No acute finding. No occlusion or significant stenosis. CHEST: MEDIASTINUM: No mediastinal lymphadenopathy. Mild cardiomegaly. Left atrial appendage occluder. Mild coronary atherosclerosis of the LAD and right coronary artery. The pericardium demonstrates no acute abnormality. LUNGS AND PLEURA: Multifocal subpleural patchy opacities, right upper lobe predominant, suggesting multifocal pneumonia although underlying post-infectious/inflammatory scarring is possible. Biapical pleural parenchymal scarring. Trace right pleural effusion. No pneumothorax. THORACIC BONES AND SOFT TISSUES: Mild degenerative changes of the lower thoracic spine. No acute soft tissue abnormality. ABDOMEN AND PELVIS: LIVER: The liver is unremarkable. GALLBLADDER AND BILE DUCTS: Gallbladder is unremarkable. No biliary ductal dilatation. SPLEEN: The spleen is unremarkable. PANCREAS: The pancreas is unremarkable. ADRENAL  GLANDS: Bilateral adrenal glands demonstrate no acute abnormality. KIDNEYS, URETERS AND BLADDER: 4 mm nonobstructing right lower pole renal calculus (image 130). Bilateral renal cysts, measuring up to 3.5 cm in the left upper kidney, benign (Bosniak 1). No follow-up is recommended. No  hydronephrosis. No perinephric or periureteral stranding. Urinary bladder is unremarkable. GI AND BOWEL: Stomach and duodenal sweep demonstrate no acute abnormality. Sigmoid diverticulosis, without evidence of diverticulitis. There is no bowel obstruction. No abnormal bowel wall thickening or distension. REPRODUCTIVE: Diminutive prostate. PERITONEUM AND RETROPERITONEUM: No ascites or free air. LYMPH NODES: No lymphadenopathy. ABDOMINAL BONES AND SOFT TISSUES: Mild degenerative changes of the upper lumbar spine. Mild lumbar levoscoliosis. No acute soft tissue abnormality. IMPRESSION: 1. No thoracoabdominal aortic aneurysm or dissection. 2. No central pulmonary embolism. 3. Suspected multifocal pneumonia, right upper lobe predominant. Underlying postinfectious/inflammatory scarring is possible. 4. Trace right pleural effusion. 5. Additional ancillary findings, as above. Electronically signed by: Pinkie Pebbles MD 03/04/2024 08:00 PM EST RP Workstation: [id number]   CT HEAD WO CONTRAST Result Date: 03/04/2024 EXAM: CT HEAD WITHOUT CONTRAST 03/04/2024 07:48:00 PM TECHNIQUE: CT of the head was performed without the administration of intravenous contrast. Automated exposure control, iterative reconstruction, and/or weight based adjustment of the mA/kV was utilized to reduce the radiation dose to as low as reasonably achievable. COMPARISON: 12/13/2023 CLINICAL HISTORY: Head trauma, moderate-severe. FINDINGS: BRAIN AND VENTRICLES: No acute hemorrhage. No evidence of acute infarct. No hydrocephalus. No extra-axial collection. No mass effect or midline shift. Stable atrophy. Periventricular and deep white matter hypodensity typical of chronic small vessel ischemia. Unchanged chronic gyriform mineralized densities in the occipital lobes. 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 Intracranial atherosclerosis. ORBITS: No acute abnormality. SINUSES: No acute abnormality. SOFT TISSUES AND SKULL: No acute  soft tissue abnormality. No skull fracture. IMPRESSION: 1. No acute intracranial abnormality. 2. Prior infarcts and additional stable ancillary findings, as above. Electronically signed by: Pinkie Pebbles MD 03/04/2024 07:55 PM EST RP Workstation: [id number]   DG Pelvis Portable Result Date: 03/04/2024 EXAM: 1 VIEW(S) XRAY OF THE PELVIS 03/04/2024 07:04:56 PM COMPARISON: None available. CLINICAL HISTORY: Trauma FINDINGS: BONES AND JOINTS: No acute fracture. No focal osseous lesion. No joint dislocation. Mild degenerative changes at L4-L5. SOFT TISSUES: The soft tissues are unremarkable. IMPRESSION: 1. No evidence of acute traumatic injury. Electronically signed by: Pinkie Pebbles MD 03/04/2024 07:11 PM EST RP Workstation: [id number]   DG Chest Port 1 View Result Date: 03/04/2024 EXAM: 1 VIEW(S) XRAY OF THE CHEST 03/04/2024 07:04:56 PM COMPARISON: 12/26/2021. CLINICAL HISTORY: Trauma FINDINGS: LINES, TUBES AND DEVICES: Cardiac device overlying the left upper heart. LUNGS AND PLEURA: Calcified left upper lobe granuloma. Multifocal patchy opacities in the bilateral upper lobes, suspicious for pneumonia. Mild subpleural scarring at the lung bases. Biapical pleural parenchymal scarring. No pulmonary edema. No pleural effusion. No pneumothorax. HEART AND MEDIASTINUM: No acute abnormality of the cardiac and mediastinal silhouettes. BONES AND SOFT TISSUES: No acute osseous abnormality. IMPRESSION: 1. Multifocal patchy opacities in the bilateral upper lobes, suspicious for pneumonia. Electronically signed by: Pinkie Pebbles MD 03/04/2024 07:10 PM EST RP Workstation: [id number]      Subjective: Patient seen and examined at bedside today.  Hemodynamically stable.  Comfortable.  Eating his breakfast.  Denies any new complaints today.  No shortness of breath or cough.  On room air.  Speaking in full sentences.  Medically stable for discharge to SNF today.  Discharge plan discussed with son at  bedside.  Discharge Exam: Vitals:   03/07/24 9386  03/07/24 0725  BP: 116/85 112/84  Pulse: 94 (!) 101  Resp:  18  Temp: 97.7 F (36.5 C) 97.7 F (36.5 C)  SpO2: 99% 98%   Vitals:   03/06/24 2155 03/07/24 0007 03/07/24 0613 03/07/24 0725  BP: 127/78  116/85 112/84  Pulse: 95  94 (!) 101  Resp:    18  Temp: 99.4 F (37.4 C) 98.6 F (37 C) 97.7 F (36.5 C) 97.7 F (36.5 C)  TempSrc:  Oral Oral   SpO2: 98%  99% 98%  Weight:      Height:        General: Pt is alert, awake, not in acute distress,pleasant elderly gentleman Cardiovascular: RRR, S1/S2 +, no rubs, no gallops Respiratory: CTA bilaterally, no wheezing, no rhonchi Abdominal: Soft, NT, ND, bowel sounds + Extremities: no edema, no cyanosis    The results of significant diagnostics from this hospitalization (including imaging, microbiology, ancillary and laboratory) are listed below for reference.     Microbiology: Recent Results (from the past 240 hours)  Resp panel by RT-PCR (RSV, Flu A&B, Covid) Anterior Nasal Swab     Status: None   Collection Time: 03/04/24  8:59 PM   Specimen: Anterior Nasal Swab  Result Value Ref Range Status   SARS Coronavirus 2 by RT PCR NEGATIVE NEGATIVE Final   Influenza A by PCR NEGATIVE NEGATIVE Final   Influenza B by PCR NEGATIVE NEGATIVE Final    Comment: (NOTE) The Xpert Xpress SARS-CoV-2/FLU/RSV plus assay is intended as an aid in the diagnosis of influenza from Nasopharyngeal swab specimens and should not be used as a sole basis for treatment. Nasal washings and aspirates are unacceptable for Xpert Xpress SARS-CoV-2/FLU/RSV testing.  Fact Sheet for Patients: bloggercourse.com  Fact Sheet for Healthcare Providers: seriousbroker.it  This test is not yet approved or cleared by the [place]  FDA and has been authorized for detection and/or diagnosis of SARS-CoV-2 by FDA under an Emergency Use Authorization (EUA). This  EUA will remain in effect (meaning this test can be used) for the duration of the COVID-19 declaration under Section 564(b)(1) of the Act, 21 U.S.C. section 360bbb-3(b)(1), unless the authorization is terminated or revoked.     Resp Syncytial Virus by PCR NEGATIVE NEGATIVE Final    Comment: (NOTE) Fact Sheet for Patients: bloggercourse.com  Fact Sheet for Healthcare Providers: seriousbroker.it  This test is not yet approved or cleared by the [place]  FDA and has been authorized for detection and/or diagnosis of SARS-CoV-2 by FDA under an Emergency Use Authorization (EUA). This EUA will remain in effect (meaning this test can be used) for the duration of the COVID-19 declaration under Section 564(b)(1) of the Act, 21 U.S.C. section 360bbb-3(b)(1), unless the authorization is terminated or revoked.  Performed at [facility], [address], [place], [place] [address]   Blood culture (routine x 2)     Status: None (Preliminary result)   Collection Time: 03/04/24  9:16 PM   Specimen: BLOOD  Result Value Ref Range Status   Specimen Description BLOOD RIGHT ANTECUBITAL  Final   Special Requests   Final    BOTTLES DRAWN AEROBIC AND ANAEROBIC Blood Culture adequate volume   Culture   Final    NO GROWTH 3 DAYS Performed at [facility], [address], [place], [place] [address]    Report Status PENDING  Incomplete  Blood culture (routine x 2)     Status: None (Preliminary result)   Collection Time: 03/04/24  9:16 PM   Specimen: BLOOD  Result Value Ref Range Status   Specimen Description BLOOD SITE NOT SPECIFIED  Final   Special Requests   Final    BOTTLES DRAWN AEROBIC AND ANAEROBIC Blood Culture adequate volume   Culture   Final    NO GROWTH 3 DAYS Performed at [facility], [address], [place], [place] [address]    Report Status PENDING  Incomplete  Respiratory (~20 pathogens) panel by PCR      Status: None   Collection Time: 03/06/24  7:46 AM   Specimen: Nasopharyngeal Swab; Respiratory  Result Value Ref Range Status   Adenovirus NOT DETECTED NOT DETECTED Final   Coronavirus 229E NOT DETECTED NOT DETECTED Final    Comment: (NOTE) The Coronavirus on the Respiratory Panel, DOES NOT test for the novel  Coronavirus (2019 nCoV)    Coronavirus HKU1 NOT DETECTED NOT DETECTED Final   Coronavirus NL63 NOT DETECTED NOT DETECTED Final   Coronavirus OC43 NOT DETECTED NOT DETECTED Final   Metapneumovirus NOT DETECTED NOT DETECTED Final   Rhinovirus / Enterovirus NOT DETECTED NOT DETECTED Final   Influenza A NOT DETECTED NOT DETECTED Final   Influenza B NOT DETECTED NOT DETECTED Final   Parainfluenza Virus 1 NOT DETECTED NOT DETECTED Final   Parainfluenza Virus 2 NOT DETECTED NOT DETECTED Final   Parainfluenza Virus 3 NOT DETECTED NOT DETECTED Final   Parainfluenza Virus 4 NOT DETECTED NOT DETECTED Final   Respiratory Syncytial Virus NOT DETECTED NOT DETECTED Final   Bordetella pertussis NOT DETECTED NOT DETECTED Final   Bordetella Parapertussis NOT DETECTED NOT DETECTED Final   Chlamydophila pneumoniae NOT DETECTED NOT DETECTED Final   Mycoplasma pneumoniae NOT DETECTED NOT DETECTED Final    Comment: Performed at [facility], [address], [place], [place] [address]     Labs: BNP (last 3 results) No results for input(s): BNP in the last 8760 hours. Basic Metabolic Panel: Recent Labs  Lab 03/04/24 1855 03/04/24 1911 03/05/24 0103 03/06/24 0922  NA 131* 133* 134* 131*  K 4.2 4.3 4.4 4.0  CL 95* 97* 99 97*  CO2 24  --  24 25  GLUCOSE 120* 114* 204* 130*  BUN 15 16 13 9   CREATININE 0.95 1.00 0.93 0.84  CALCIUM  9.1  --  8.8* 8.7*  MG 2.0  --  2.0  --   PHOS 2.3*  --  2.3* 2.5  2.7   Liver Function Tests: Recent Labs  Lab 03/04/24 1855 03/05/24 0103 03/06/24 0922  AST 29 30  --   ALT 32 27  --   ALKPHOS 314* 268*  --   BILITOT 0.4 0.7  --   PROT 7.5 6.8   --   ALBUMIN 3.3* 3.0* 2.7*   No results for input(s): LIPASE, AMYLASE in the last 168 hours. No results for input(s): AMMONIA in the last 168 hours. CBC: Recent Labs  Lab 03/04/24 1855 03/04/24 1911 03/05/24 0103 03/06/24 0922  WBC 7.4  --  7.7 6.9  NEUTROABS  --   --   --  4.1  HGB 11.4* 12.9* 10.4* 10.8*  HCT 35.0* 38.0* 31.2* 32.7*  MCV 94.6  --  94.5 94.0  PLT 192  --  179 193   Cardiac Enzymes: Recent Labs  Lab 03/04/24 1855  CKTOTAL 138   BNP: Invalid input(s): POCBNP CBG: No results for input(s): GLUCAP in the last 168 hours. D-Dimer No results for input(s): DDIMER in the last 72  hours. Hgb A1c No results for input(s): HGBA1C in the last 72 hours. Lipid Profile No results for input(s): CHOL, HDL, LDLCALC, TRIG, CHOLHDL, LDLDIRECT in the last 72 hours. Thyroid function studies No results for input(s): TSH, T4TOTAL, T3FREE, THYROIDAB in the last 72 hours.  Invalid input(s): FREET3 Anemia work up Recent Labs    03/04/24 1855 03/05/24 0103  VITAMINB12  --  1,130*  FOLATE  --  >20.0  FERRITIN  --  65  TIBC  --  325  IRON  --  19*  RETICCTPCT 1.4  --    Urinalysis    Component Value Date/Time   COLORURINE YELLOW 03/04/2024 1917   APPEARANCEUR CLEAR 03/04/2024 1917   LABSPEC 1.012 03/04/2024 1917   PHURINE 6.0 03/04/2024 1917   GLUCOSEU NEGATIVE 03/04/2024 1917   HGBUR NEGATIVE 03/04/2024 1917   BILIRUBINUR NEGATIVE 03/04/2024 1917   KETONESUR NEGATIVE 03/04/2024 1917   PROTEINUR NEGATIVE 03/04/2024 1917   NITRITE NEGATIVE 03/04/2024 1917   LEUKOCYTESUR NEGATIVE 03/04/2024 1917   Sepsis Labs Recent Labs  Lab 03/04/24 1855 03/05/24 0103 03/06/24 0922  WBC 7.4 7.7 6.9   Microbiology Recent Results (from the past 240 hours)  Resp panel by RT-PCR (RSV, Flu A&B, Covid) Anterior Nasal Swab     Status: None   Collection Time: 03/04/24  8:59 PM   Specimen: Anterior Nasal Swab  Result Value Ref Range Status    SARS Coronavirus 2 by RT PCR NEGATIVE NEGATIVE Final   Influenza A by PCR NEGATIVE NEGATIVE Final   Influenza B by PCR NEGATIVE NEGATIVE Final    Comment: (NOTE) The Xpert Xpress SARS-CoV-2/FLU/RSV plus assay is intended as an aid in the diagnosis of influenza from Nasopharyngeal swab specimens and should not be used as a sole basis for treatment. Nasal washings and aspirates are unacceptable for Xpert Xpress SARS-CoV-2/FLU/RSV testing.  Fact Sheet for Patients: bloggercourse.com  Fact Sheet for Healthcare Providers: seriousbroker.it  This test is not yet approved or cleared by the [place]  FDA and has been authorized for detection and/or diagnosis of SARS-CoV-2 by FDA under an Emergency Use Authorization (EUA). This EUA will remain in effect (meaning this test can be used) for the duration of the COVID-19 declaration under Section 564(b)(1) of the Act, 21 U.S.C. section 360bbb-3(b)(1), unless the authorization is terminated or revoked.     Resp Syncytial Virus by PCR NEGATIVE NEGATIVE Final    Comment: (NOTE) Fact Sheet for Patients: bloggercourse.com  Fact Sheet for Healthcare Providers: seriousbroker.it  This test is not yet approved or cleared by the [place]  FDA and has been authorized for detection and/or diagnosis of SARS-CoV-2 by FDA under an Emergency Use Authorization (EUA). This EUA will remain in effect (meaning this test can be used) for the duration of the COVID-19 declaration under Section 564(b)(1) of the Act, 21 U.S.C. section 360bbb-3(b)(1), unless the authorization is terminated or revoked.  Performed at [facility], [address], [place], [place] [address]   Blood culture (routine x 2)     Status: None (Preliminary result)   Collection Time: 03/04/24  9:16 PM   Specimen: BLOOD  Result Value Ref Range Status   Specimen Description  BLOOD RIGHT ANTECUBITAL  Final   Special Requests   Final    BOTTLES DRAWN AEROBIC AND ANAEROBIC Blood Culture adequate volume   Culture   Final    NO GROWTH 3 DAYS Performed at [facility], [address] [place], [place] [address]  Report Status PENDING  Incomplete  Blood culture (routine x 2)     Status: None (Preliminary result)   Collection Time: 03/04/24  9:16 PM   Specimen: BLOOD  Result Value Ref Range Status   Specimen Description BLOOD SITE NOT SPECIFIED  Final   Special Requests   Final    BOTTLES DRAWN AEROBIC AND ANAEROBIC Blood Culture adequate volume   Culture   Final    NO GROWTH 3 DAYS Performed at [facility], [address] [place], [place] [address]    Report Status PENDING  Incomplete  Respiratory (~20 pathogens) panel by PCR     Status: None   Collection Time: 03/06/24  7:46 AM   Specimen: Nasopharyngeal Swab; Respiratory  Result Value Ref Range Status   Adenovirus NOT DETECTED NOT DETECTED Final   Coronavirus 229E NOT DETECTED NOT DETECTED Final    Comment: (NOTE) The Coronavirus on the Respiratory Panel, DOES NOT test for the novel  Coronavirus (2019 nCoV)    Coronavirus HKU1 NOT DETECTED NOT DETECTED Final   Coronavirus NL63 NOT DETECTED NOT DETECTED Final   Coronavirus OC43 NOT DETECTED NOT DETECTED Final   Metapneumovirus NOT DETECTED NOT DETECTED Final   Rhinovirus / Enterovirus NOT DETECTED NOT DETECTED Final   Influenza A NOT DETECTED NOT DETECTED Final   Influenza B NOT DETECTED NOT DETECTED Final   Parainfluenza Virus 1 NOT DETECTED NOT DETECTED Final   Parainfluenza Virus 2 NOT DETECTED NOT DETECTED Final   Parainfluenza Virus 3 NOT DETECTED NOT DETECTED Final   Parainfluenza Virus 4 NOT DETECTED NOT DETECTED Final   Respiratory Syncytial Virus NOT DETECTED NOT DETECTED Final   Bordetella pertussis NOT DETECTED NOT DETECTED Final   Bordetella Parapertussis NOT DETECTED NOT DETECTED Final   Chlamydophila pneumoniae NOT  DETECTED NOT DETECTED Final   Mycoplasma pneumoniae NOT DETECTED NOT DETECTED Final    Comment: Performed at [facility], [address], [place], [place] [address]    Please note: You were cared for by a hospitalist during your hospital stay. Once you are discharged, your primary care physician will handle any further medical issues. Please note that NO REFILLS for any discharge medications will be authorized once you are discharged, as it is imperative that you return to your primary care physician (or establish a relationship with a primary care physician if you do not have one) for your post hospital discharge needs so that they can reassess your need for medications and monitor your lab values.    Time coordinating discharge: 40 minutes  SIGNED:   Ivonne Mustache, MD  [facility] 03/07/2024, 10:47 AM Pager [phone]  If 7PM-7AM, please contact night-coverage [email or web address] Password [email or web address]

## 2024-03-07 NOTE — TOC Transition Note (Signed)
 Transition of Care [facility]) - Discharge Note   Patient Details  Name: Elijah Stone MRN: [id number] Date of Birth: 1930-08-16  Transition of Care [facility]) CM/SW Contact:  Lendia Dais, LCSWA Phone Number: 03/07/2024, 11:58 AM   Clinical Narrative:   Pt is discharging to [facility], RN report to [phone]. Safe transport schedule for 1330.  CSW alert pt's family at bedside and they understand that they can complete the admissions paper work for the pt when the pt arrives at the facility.   No further TOC needs.    Final next level of care: Skilled Nursing Facility Barriers to Discharge: Barriers Resolved   Patient Goals and CMS Choice            Discharge Placement              Patient chooses bed at: [facility] Patient to be transferred to facility by: Safe transport Name of family member notified: Debby and Kaedyn Polivka Patient and family notified of of transfer: 03/07/24  Discharge Plan and Services Additional resources added to the After Visit Summary for                                       Social Drivers of Health (SDOH) Interventions SDOH Screenings   Food Insecurity: No Food Insecurity (03/05/2024)  Housing: Low Risk  (03/05/2024)  Transportation Needs: No Transportation Needs (03/05/2024)  Utilities: Not At Risk (03/05/2024)  Social Connections: Unknown (03/05/2024)  Tobacco Use: Medium Risk (03/03/2024)   Received from [facility]     Readmission Risk Interventions     No data to display

## 2024-03-07 NOTE — Progress Notes (Signed)
 Elijah Stone to be discharged Skilled nursing facility [facility] per MD order and gave report to the nurse Briant and answered all her questions. Patient  and family verbalized understanding.  Skin clean, dry and intact without evidence of skin break down, no evidence of skin tears noted. IV catheter discontinued intact. Site without signs and symptoms of complications. Dressing and pressure applied. Pt denies pain at the site currently. No complaints noted.  Patient free of lines, drains, and wounds.   Discharge packet assembled. An After Visit Summary (AVS) was printed and given to the safe transport. Patient escorted via w/c and discharged to designated Skilled Nursing Facility via ambulance. Report called to accepting facility; all questions and concerns addressed.   Franciso Bodily, RN

## 2024-03-07 NOTE — Care Management Important Message (Signed)
 Important Message  Patient Details  Name: Elijah Stone MRN: [id number] Date of Birth: 1931-01-30   Important Message Given:  Yes - Medicare IM     Claretta Deed 03/07/2024, 3:24 PM

## 2024-03-07 NOTE — Progress Notes (Signed)
 Mobility Specialist Progress Note:    03/07/24 1201  Mobility  Activity Ambulated with assistance  Level of Assistance Minimal assist, patient does 75% or more  Assistive Device Front wheel walker  Distance Ambulated (ft) 115 ft (x2)  Activity Response Tolerated well  Mobility Referral Yes  Mobility visit 1 Mobility  Mobility Specialist Start Time (ACUTE ONLY) 1050  Mobility Specialist Stop Time (ACUTE ONLY) 1101  Mobility Specialist Time Calculation (min) (ACUTE ONLY) 11 min   Pt received in bed eager for mobility. MinA for STS and a close contact guard d/t unsteadiness and mild knee buckling. Returned to room w/o fault. Left in chair w/ call bell and personal belongings in reach. All needs met. Family in room.  Thersia Minder Mobility Specialist  Please contact vis Secure Chat or  Rehab Office [phone]

## 2024-03-07 NOTE — TOC Progression Note (Addendum)
 Transition of Care [facility]) - Progression Note    Patient Details  Name: Elijah Stone MRN: [id number] Date of Birth: 03/13/1931  Transition of Care [facility]) CM/SW Contact  Lendia Dais, [place] Phone Number: 03/07/2024, 10:08 AM  Clinical Narrative:  Shara has been approved Auth ID [id number], valid from 03/07/2024-03/11/2024. MD notified.  10:26 - CSW spoke to Debby (son) and informed him of the pt discharging today to [facility] and that shara is approved. CSW informed the family of travel by safe transport.   CSW spoke to [place] of [facility] who stated they can take the pt today and have spoken to the family and will be able to provide assistance inside.  CSW will monitor for DC summary to schedule transport.                        Expected Discharge Plan and Services                                               Social Drivers of Health (SDOH) Interventions SDOH Screenings   Food Insecurity: No Food Insecurity (03/05/2024)  Housing: Low Risk  (03/05/2024)  Transportation Needs: No Transportation Needs (03/05/2024)  Utilities: Not At Risk (03/05/2024)  Social Connections: Unknown (03/05/2024)  Tobacco Use: Medium Risk (03/03/2024)   Received from [facility]    Readmission Risk Interventions     No data to display

## 2024-03-09 LAB — CULTURE, BLOOD (ROUTINE X 2)
Culture: NO GROWTH
Culture: NO GROWTH
Special Requests: ADEQUATE
Special Requests: ADEQUATE

## 2024-04-28 ENCOUNTER — Emergency Department (HOSPITAL_COMMUNITY)
Admission: EM | Admit: 2024-04-28 | Discharge: 2024-04-28 | Disposition: A | Discharge status: Home / Self Care | Attending: Emergency Medicine | Admitting: Emergency Medicine

## 2024-04-28 ENCOUNTER — Emergency Department (HOSPITAL_COMMUNITY)

## 2024-04-28 DIAGNOSIS — M25512 Pain in left shoulder: Secondary | ICD-10-CM | POA: Insufficient documentation

## 2024-04-28 DIAGNOSIS — S42212K Unspecified displaced fracture of surgical neck of left humerus, subsequent encounter for fracture with nonunion: Secondary | ICD-10-CM

## 2024-04-28 DIAGNOSIS — E854 Organ-limited amyloidosis: Secondary | ICD-10-CM | POA: Diagnosis not present

## 2024-04-28 DIAGNOSIS — Z7901 Long term (current) use of anticoagulants: Secondary | ICD-10-CM | POA: Diagnosis not present

## 2024-04-28 DIAGNOSIS — Z8673 Personal history of transient ischemic attack (TIA), and cerebral infarction without residual deficits: Secondary | ICD-10-CM | POA: Diagnosis not present

## 2024-04-28 DIAGNOSIS — S0990XA Unspecified injury of head, initial encounter: Secondary | ICD-10-CM | POA: Diagnosis present

## 2024-04-28 DIAGNOSIS — W19XXXA Unspecified fall, initial encounter: Secondary | ICD-10-CM

## 2024-04-28 DIAGNOSIS — W1830XA Fall on same level, unspecified, initial encounter: Secondary | ICD-10-CM | POA: Insufficient documentation

## 2024-04-28 DIAGNOSIS — I680 Cerebral amyloid angiopathy: Secondary | ICD-10-CM | POA: Diagnosis not present

## 2024-04-28 LAB — CBC WITH DIFFERENTIAL/PLATELET
Abs Immature Granulocytes: 0.02 K/uL (ref 0.00–0.07)
Basophils Absolute: 0 K/uL (ref 0.0–0.1)
Basophils Relative: 0 %
Eosinophils Absolute: 0.1 K/uL (ref 0.0–0.5)
Eosinophils Relative: 2 %
HCT: 33.1 % — ABNORMAL LOW (ref 39.0–52.0)
Hemoglobin: 11.1 g/dL — ABNORMAL LOW (ref 13.0–17.0)
Immature Granulocytes: 0 %
Lymphocytes Relative: 16 %
Lymphs Abs: 0.8 K/uL (ref 0.7–4.0)
MCH: 32.5 pg (ref 26.0–34.0)
MCHC: 33.5 g/dL (ref 30.0–36.0)
MCV: 96.8 fL (ref 80.0–100.0)
Monocytes Absolute: 0.9 K/uL (ref 0.1–1.0)
Monocytes Relative: 19 %
Neutro Abs: 3.1 K/uL (ref 1.7–7.7)
Neutrophils Relative %: 63 %
Platelets: 144 K/uL — ABNORMAL LOW (ref 150–400)
RBC: 3.42 MIL/uL — ABNORMAL LOW (ref 4.22–5.81)
RDW: 16.3 % — ABNORMAL HIGH (ref 11.5–15.5)
WBC: 4.9 K/uL (ref 4.0–10.5)
nRBC: 0 % (ref 0.0–0.2)

## 2024-04-28 LAB — URINALYSIS, ROUTINE W REFLEX MICROSCOPIC
Bilirubin Urine: NEGATIVE
Glucose, UA: NEGATIVE mg/dL
Hgb urine dipstick: NEGATIVE
Ketones, ur: NEGATIVE mg/dL
Leukocytes,Ua: NEGATIVE
Nitrite: NEGATIVE
Protein, ur: NEGATIVE mg/dL
Specific Gravity, Urine: 1.005 (ref 1.005–1.030)
pH: 7 (ref 5.0–8.0)

## 2024-04-28 LAB — COMPREHENSIVE METABOLIC PANEL WITH GFR
ALT: 48 U/L — ABNORMAL HIGH (ref 0–44)
AST: 83 U/L — ABNORMAL HIGH (ref 15–41)
Albumin: 3.5 g/dL (ref 3.5–5.0)
Alkaline Phosphatase: 308 U/L — ABNORMAL HIGH (ref 38–126)
Anion gap: 7 (ref 5–15)
BUN: 12 mg/dL (ref 8–23)
CO2: 26 mmol/L (ref 22–32)
Calcium: 8.4 mg/dL — ABNORMAL LOW (ref 8.9–10.3)
Chloride: 100 mmol/L (ref 98–111)
Creatinine, Ser: 0.75 mg/dL (ref 0.61–1.24)
GFR, Estimated: >60 mL/min
Glucose, Bld: 80 mg/dL (ref 70–99)
Potassium: 3.9 mmol/L (ref 3.5–5.1)
Sodium: 134 mmol/L — ABNORMAL LOW (ref 135–145)
Total Bilirubin: 0.8 mg/dL (ref 0.0–1.2)
Total Protein: 6.3 g/dL — ABNORMAL LOW (ref 6.5–8.1)

## 2024-04-28 MED ORDER — ACETAMINOPHEN 325 MG PO TABS
650.0000 mg | ORAL_TABLET | Freq: Once | ORAL | Status: AC
  Administered 2024-04-28: 650 mg via ORAL
  Filled 2024-04-28: qty 2

## 2024-04-28 MED ORDER — HYDROXYZINE HCL 25 MG PO TABS: 25.0000 mg | ORAL_TABLET | Freq: Every evening | ORAL | 0 refills | Status: AC | PRN

## 2024-04-28 NOTE — Progress Notes (Signed)
 Orthopedic Tech Progress Note Patient Details:  Elijah Stone [birth year] [id number]  Level 2 trauma   Patient ID: Elijah Stone, male   DOB: 1930-12-05, 89 y.o.   MRN: [id number]  Elijah Stone 04/28/2024, 12:44 PM

## 2024-04-28 NOTE — Discharge Instructions (Addendum)
 It was a pleasure caring for you today in the emergency department.  Based on the events which brought you to the ER today, it is possible that you may have a concussion. A concussion occurs when there is a blow to the head or body, with enough force to shake the brain and disrupt how the brain functions. You may experience symptoms such as headaches, sensitivity to light/noise, dizziness, cognitive slowing, difficulty concentrating / remembering, trouble sleeping and drowsiness. These symptoms may last anywhere from hours/days to potentially weeks/months. While these symptoms are very frustrating and perhaps debilitating, it is important that you remember that they will improve over time. Everyone has a different rate of recovery; it is difficult to predict when your symptoms will resolve. In order to allow for your brain to heal after the injury, we recommend that you see your primary physician or a physician knowledgeable in concussion management. We also advise you to let your body and brain rest: avoid physical activities (sports, gym, and exercise) and reduce cognitive demands (reading, texting, TV watching, computer use, video games, etc). School attendance, after-school activities and work may need to be modified to avoid increasing symptoms. We recommend against driving until until all symptoms have resolved. Come back to the ER right away if you are having repeated episodes of vomiting, severe/worsening headache/dizziness or any other symptom that alarms you. We recommended that someone stay with you for the next 24 hours to monitor for these worrisome symptoms.   Please return to the emergency department for any worsening or worrisome symptoms.

## 2024-04-28 NOTE — ED Notes (Signed)
 Pt ambulated w/ walker. Pt slow but relatively steady on feet, family says that he is much slower and more unsteady than baseline. Pt does use a walker at home.

## 2024-04-28 NOTE — ED Provider Notes (Signed)
 " [place] [facility] Provider Note   CSN: [id number] Arrival date & time: 04/28/24  1237     Patient presents with: No chief complaint on file.   Elijah Stone is a 89 y.o. male.   HPI 89 year old male presents as a level 2 trauma.  Patient fell and struck his head.  Per EMS report, patient was reaching backwards for his walker and fell backwards.  He denies any headache or neck pain.  He states that shoulder is hurting but it has been hurting for many months.  His left hip was transiently hurting but he states that seems to be resolved.  He denies any chest pain or shortness of breath.  He feels generally weak but this is not a new thing.  Prior to Admission medications  Medication Sig Start Date End Date Taking? Authorizing Provider  acetaminophen  (TYLENOL ) 500 MG tablet Take 500 mg by mouth at bedtime.    [provider]  apixaban  (ELIQUIS ) 2.5 MG TABS tablet Take 1 tablet (2.5 mg total) by mouth 2 (two) times daily. 03/06/24   Rosemarie Eather RAMAN, MD  cholecalciferol  (VITAMIN D3) 25 MCG (1000 UNIT) tablet Take 1,000 Units by mouth daily.    [provider]  cholestyramine  (QUESTRAN ) 4 g packet Take 4 g by mouth See admin instructions. Take 1 packet (4 g total) by mouth 2 (two) times a day. Take one packet twice daily (make sure other drugs are taken either 2 hours prior to cholestyramine  or 4 hours after cholestyramine ) 02/27/24   [provider]  cycloSPORINE  (RESTASIS ) 0.05 % ophthalmic emulsion Place 1 drop into both eyes 2 (two) times daily.    [provider]  ferrous sulfate  325 (65 FE) MG tablet Take 1 tablet (325 mg total) by mouth 2 (two) times daily with a meal. 03/07/24   Jillian Buttery, MD  fluocinonide cream (LIDEX) 0.05 % Apply 1 application. topically 2 (two) times daily as needed (rash).    [provider]  fluticasone (FLONASE) 50 MCG/ACT nasal spray Place 1 spray into both nostrils  daily. Patient not taking: Reported on 03/04/2024    [provider]  hydrOXYzine  (ATARAX ) 25 MG tablet Take 25 mg by mouth at bedtime. itching Patient not taking: Reported on 03/04/2024 05/24/23   [provider]  latanoprost  (XALATAN ) 0.005 % ophthalmic solution Place 1 drop into both eyes at bedtime.    [provider]  levocetirizine (XYZAL) 5 MG tablet Take 5 mg by mouth every evening.    [provider]  levothyroxine  (SYNTHROID ) 88 MCG tablet Take 88 mcg by mouth daily before breakfast.    [provider]  omeprazole (PRILOSEC) 40 MG capsule Take 40 mg by mouth daily. Patient not taking: Reported on 03/04/2024    [provider]  polyethylene glycol powder (MIRALAX ) 17 GM/SCOOP powder Take 17 g by mouth daily.    [provider]  rosuvastatin  (CRESTOR ) 20 MG tablet Take 1 tablet (20 mg total) by mouth daily. 09/16/21   Remi Pippin, NP  sacubitril -valsartan  (ENTRESTO ) 24-26 MG Take 1 tablet by mouth at bedtime.    [provider]  Sodium Chloride , Hypertonic, (MURO 128 OP) Apply 1 drop to eye 2 (two) times daily.    [provider]    Allergies: Codeine    Review of Systems  Respiratory:  Negative for shortness of breath.   Cardiovascular:  Negative for chest pain.  Musculoskeletal:  Positive for arthralgias. Negative for neck  pain.  Neurological:  Negative for headaches.    Updated Vital Signs BP 124/84   Pulse 63   Resp 20   SpO2 94%   Physical Exam Vitals and nursing note reviewed.  Constitutional:      General: He is not in acute distress.    Appearance: He is well-developed. He is not ill-appearing or diaphoretic.  HENT:     Head: Normocephalic and atraumatic.     Comments: No tenderness/hematoma Cardiovascular:     Rate and Rhythm: Normal rate and regular rhythm.     Heart sounds: Normal heart sounds.  Pulmonary:     Effort: Pulmonary effort is normal.     Breath sounds: Normal  breath sounds.  Abdominal:     Palpations: Abdomen is soft.     Tenderness: There is no abdominal tenderness.  Musculoskeletal:     Left shoulder: Tenderness present. No swelling or deformity.     Cervical back: No spinous process tenderness or muscular tenderness.     Right hip: Normal range of motion.     Left hip: No tenderness. Normal range of motion.  Skin:    General: Skin is warm and dry.  Neurological:     Mental Status: He is alert.     Comments: Awake, alert, hard of hearing. 5/5 strength in all 4 extremities.     (all labs ordered are listed, but only abnormal results are displayed) Labs Reviewed  COMPREHENSIVE METABOLIC PANEL WITH GFR - Abnormal; Notable for the following components:      Result Value   Sodium 134 (*)    Calcium  8.4 (*)    Total Protein 6.3 (*)    AST 83 (*)    ALT 48 (*)    Alkaline Phosphatase 308 (*)    All other components within normal limits  CBC WITH DIFFERENTIAL/PLATELET - Abnormal; Notable for the following components:   RBC 3.42 (*)    Hemoglobin 11.1 (*)    HCT 33.1 (*)    RDW 16.3 (*)    Platelets 144 (*)    All other components within normal limits  URINALYSIS, ROUTINE W REFLEX MICROSCOPIC    EKG: None  Radiology: DG Shoulder Left Result Date: 04/28/2024 EXAM: 1 VIEW(S) XRAY OF THE LEFT SHOULDER 04/28/2024 01:16:00 PM COMPARISON: CT 03/04/2024. CLINICAL HISTORY: fall FINDINGS: BONES AND JOINTS: Glenohumeral joint is normally aligned. 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 SOFT TISSUES: Soft tissue swelling about the shoulder. No abnormal calcifications. Visualized lung is unremarkable. Prosthetic heart valve noted. IMPRESSION: 1. 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 2. Soft tissue swelling about the shoulder. Electronically signed by: Dayne Hassell MD 04/28/2024 02:22 PM EST RP  Workstation: [id number]   DG Chest Portable 1 View Result Date: 04/28/2024 EXAM: 1 VIEW(S) XRAY OF THE CHEST 04/28/2024 01:16:00 PM COMPARISON: 03/04/2024 CLINICAL HISTORY: fall FINDINGS: LUNGS AND PLEURA: Left upper lobe calcified granuloma. Persistent patchy bilateral airspace opacities predominantly in upper lobes. Interval improvement in the focal right upper lobe airspace disease. Chronic interstitial lung disease. Trace right pleural effusion. Stable biapical pleuroparenchymal scarring. No pneumothorax. HEART AND MEDIASTINUM: Atherosclerotic plaque. Left atrial appendage occlusive device noted. No acute abnormality of the cardiac and mediastinal silhouettes. BONES AND SOFT TISSUES: No acute osseous abnormality. IMPRESSION: 1. Persistent chronic appearing upper lobe airspace opacities. 2. Interval improvement in right upper lobe airspace disease. Electronically signed by: Dayne Hassell MD 04/28/2024 02:21  PM EST RP Workstation: [id number]   CT Cervical Spine Wo Contrast Result Date: 04/28/2024 EXAM: CT CERVICAL SPINE WITHOUT CONTRAST 04/28/2024 01:19:00 PM TECHNIQUE: CT of the cervical spine was performed without the administration of intravenous contrast. Multiplanar reformatted images are provided for review. Automated exposure control, iterative reconstruction, and/or weight based adjustment of the mA/kV was utilized to reduce the radiation dose to as low as reasonably achievable. COMPARISON: CT cervical spine 03/04/2024. CLINICAL HISTORY: Neck trauma. Fall. FINDINGS: The examination is mildly motion degraded. BONES AND ALIGNMENT: Chronic straightening and mild left convex curvature of the cervical spine. No acute traumatic malalignment. No acute fracture. DEGENERATIVE CHANGES: Asymmetrically severe facet arthrosis on the right at C3-C4 and C4-C5 with associated neural foraminal stenosis. Moderate multilevel disc degeneration. SOFT TISSUES: No prevertebral soft tissue swelling. LUNGS: Prominent biapical  pleural parenchymal lung scarring. IMPRESSION: 1. No acute cervical spine fracture or traumatic malalignment. Electronically signed by: Dasie Hamburg MD 04/28/2024 02:19 PM EST RP Workstation: [id number]   DG Hip Unilat W or Wo Pelvis 2-3 Views Left Result Date: 04/28/2024 EXAM: 2 or more VIEW(S) XRAY OF THE UNILATERAL HIP 04/28/2024 01:16:00 PM COMPARISON: CT 03/04/2024. CLINICAL HISTORY: fall FINDINGS: BONES AND JOINTS: No acute fracture. No malalignment. LUMBAR SPINE: Degenerative changes of visualized lower lumbar spine. SOFT TISSUES: Vascular calcifications. IMPRESSION: 1. No evidence of acute traumatic injury. Electronically signed by: Katheleen Faes MD 04/28/2024 02:19 PM EST RP Workstation: [id number]   CT Head Wo Contrast Result Date: 04/28/2024 EXAM: CT HEAD WITHOUT CONTRAST 04/28/2024 01:19:00 PM TECHNIQUE: CT of the head was performed without the administration of intravenous contrast. Automated exposure control, iterative reconstruction, and/or weight based adjustment of the mA/kV was utilized to reduce the radiation dose to as low as reasonably achievable. COMPARISON: Head CT 03/04/2024 and MRI 09/12/2021. CLINICAL HISTORY: Minor head trauma. Fall. FINDINGS: BRAIN AND VENTRICLES: There is no evidence of an acute infarct, intracranial hemorrhage, mass, midline shift, hydrocephalus, or extra-axial fluid collection. There is mild cerebral atrophy. Gyriform mineralization involving occipital cortex bilaterally is unchanged. Chronic infarcts are again noted scattered in the right MCA territory and in the left occipital lobe. Patchy hypodensities elsewhere in the cerebral white matter bilaterally are unchanged and nonspecific but compatible with mild to moderate chronic small vessel ischemic disease. Calcified atherosclerosis at the skull base. ORBITS: Bilateral cataract extraction. SINUSES: Mild mucosal thickening in the paranasal sinuses with evidence of prior sinus surgery. Clear mastoid air cells. SOFT  TISSUES AND SKULL: No acute soft tissue abnormality. No skull fracture. IMPRESSION: 1. No acute intracranial abnormality. 2. 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 Electronically signed by: Dasie Hamburg MD 04/28/2024 01:57 PM EST RP Workstation: [id number]     Procedures   Medications Ordered in the ED  acetaminophen  (TYLENOL ) tablet 650 mg (650 mg Oral Given 04/28/24 1434)                                    Medical Decision Making Amount and/or Complexity of Data Reviewed Independent Historian: EMS    Details: Son Labs: ordered.    Details: Normal WBC Radiology: ordered and independent interpretation performed.    Details: No head bleed.  No hip fracture.  Old shoulder fracture. ECG/medicine tests: ordered and independent interpretation performed.    Details: A-fib  Risk OTC drugs.   Patient with a fall.  Sounds mechanical as he was reaching back for his walker and then fell.  However the son at the bedside states that he seemed a little agitated last night and a little bit this morning which is atypical for him.  Recently got over pneumonia a few weeks ago.  No longer having any pneumonia symptoms.  No significant injuries noted though we will ambulate the patient to help rule out occult hip fracture.  Son is concerned about stroke, will order MRI.  Care transferred to Dr. Elnor.     Final diagnoses:  None    ED Discharge Orders     None          Freddi Hamilton, MD 04/28/24 1526  "

## 2024-04-28 NOTE — ED Triage Notes (Signed)
 Pt BIB [facility] from home for mechanical fall.  He was reaching back for walker and missed walker causing fall. Hit head on fridge, no LOC. L. Shoulder and Hip pai. Eliquis .  Pt is alert and oriented.  Son is POA.  18G L. AC  VSS,  140/90 HR 96 afib, RR 18 98% RA, 104 CBG   DNR, very HOH.

## 2024-04-28 NOTE — ED Provider Notes (Signed)
 " Provider Note MRN:  [id number]  Arrival date & time: 04/28/2024    ED Course and Medical Decision Making  Assumed care from [facility] at shift change.  See note from prior team for complete details, in brief:  89 year old male here as level 2 trauma Fall on thinners Labs stable X-ray stable> chronic humerus fracture, persistent opacities on x-ray CT stable MRI pending  Plan per prior physician f/u MRI, trial ambulation  MRI reviewed, concerning for possible cerebral amyloid angiopathy; patient follow-up with neurology.  Also history of prior stroke He is feeling much better, he is ambulatory, he is tolerant p.o. no difficulty. Concussion precautions   Clinical Course as of 04/28/24 1906  Mon Apr 28, 2024  1647 MRI stable, remote infarcts noted, question amyloid angiopathy  [SG]  1853 Feeling better, ambulatory w/ minimal assistance. Able to eat a sandwich in the ED. Eager for [employer]  [SG]    Clinical Course User Index [SG] Elnor Jayson LABOR, DO      7:06 PM:  I have discussed the diagnosis/risks/treatment options with the patient and family.  Evaluation and diagnostic testing in the emergency department does not suggest an emergent condition requiring admission or immediate intervention beyond what has been performed at this time.  They will follow up with pcp/neuro. We also discussed returning to the ED immediately if new or worsening sx occur. We discussed the sx which are most concerning (e.g., sudden worsening pain, fever, inability to tolerate by mouth) that necessitate immediate return.    The patient appears reasonably screened and/or stabilized for discharge and I doubt any other medical condition or other [facility] requiring further screening, evaluation, or treatment in the ED at this time prior to discharge.    Procedures  Final Clinical Impressions(s) / ED Diagnoses     ICD-10-CM   1. Fall, initial encounter  W19.XXXA     2. Cerebral amyloid angiopathy (HCC)  E85.4  Ambulatory referral to Neurology   I68.0    possible    3. Injury of head, initial encounter  S09.90XA     4. History of stroke  Z86.73       ED Discharge Orders          Ordered    Ambulatory referral to Neurology       Comments: An appointment is requested in approximately: 2 weeks   04/28/24 1817    hydrOXYzine  (ATARAX ) 25 MG tablet  At bedtime PRN        04/28/24 1905              Discharge Instructions      It was a pleasure caring for you today in the emergency department.   Based on the events which brought you to the ER today, it is possible that you may have a concussion. A concussion occurs when there is a blow to the head or body, with enough force to shake the brain and disrupt how the brain functions. You may experience symptoms such as headaches, sensitivity to light/noise, dizziness, cognitive slowing, difficulty concentrating / remembering, trouble sleeping and drowsiness. These symptoms may last anywhere from hours/days to potentially weeks/months. While these symptoms are very frustrating and perhaps debilitating, it is important that you remember that they will improve over time. Everyone has a different rate of recovery; it is difficult to predict when your symptoms will resolve. In order to allow for your brain to heal after the injury, we recommend that you see your primary physician or  a physician knowledgeable in concussion management. We also advise you to let your body and brain rest: avoid physical activities (sports, gym, and exercise) and reduce cognitive demands (reading, texting, TV watching, computer use, video games, etc). School attendance, after-school activities and work may need to be modified to avoid increasing symptoms. We recommend against driving until until all symptoms have resolved. Come back to the ER right away if you are having repeated episodes of vomiting, severe/worsening headache/dizziness or any other symptom that alarms you. We  recommended that someone stay with you for the next 24 hours to monitor for these worrisome symptoms.   Please return to the emergency department for any worsening or worrisome symptoms.          Elnor Jayson LABOR, DO 04/28/24 1906  "

## 2024-05-09 DIAGNOSIS — R748 Abnormal levels of other serum enzymes: Secondary | ICD-10-CM | POA: Insufficient documentation

## 2024-05-14 ENCOUNTER — Encounter: Payer: Self-pay | Admitting: Neurology

## 2024-05-14 ENCOUNTER — Ambulatory Visit: Admitting: Neurology

## 2024-05-14 VITALS — BP 119/64 | HR 64 | Ht 73.0 in | Wt 159.2 lb

## 2024-05-14 DIAGNOSIS — S060X0A Concussion without loss of consciousness, initial encounter: Secondary | ICD-10-CM

## 2024-05-14 DIAGNOSIS — G3184 Mild cognitive impairment, so stated: Secondary | ICD-10-CM

## 2024-05-14 DIAGNOSIS — Z8673 Personal history of transient ischemic attack (TIA), and cerebral infarction without residual deficits: Secondary | ICD-10-CM | POA: Diagnosis not present

## 2024-05-14 NOTE — Patient Instructions (Addendum)
 I had a long d/w patient and his wife about his remote stroke, recent closed head injury and concussion, chronic atrial fibrillation, watchman device and leakage,risk for recurrent stroke/TIAs, personally independently reviewed imaging studies and stroke evaluation results and answered questions.I also discussed results of MRI showing increasing microhemorrhages and superficial siderosis raising concern for cerebral amyloid angiopathy.  Patient has a high risk of intracerebral hemorrhage on anticoagulation but without anticoagulation with Eliquis  he also has significant risk of embolic strokes.  This is treatment conundrum and there is no safe or easy answer here.  We discussed risk-benefit of anticoagulation and answered questions continue Eliquis  2.5 mg twice daily for secondary stroke prevention and maintain strict control of hypertension with blood pressure goal below 130/90, diabetes with hemoglobin A1c goal below 6.5% and lipids with LDL cholesterol goal below 70 mg/dL. I also advised the patient to eat a healthy diet with plenty of whole grains, cereals, fruits and vegetables, exercise regularly and maintain ideal body weight Followup in the future with me only as needed.  I advised the patient to use a walker at all times and to get up slowly and avoid sudden movements.  We discussed fall prevention precautions.  Fall Prevention in the Home, Adult Falls can cause injuries and affect people of all ages. There are many simple things that you can do to make your home safe and to help prevent falls. If you need it, ask for help making these changes. What actions can I take to prevent falls? General information Use good lighting in all rooms. Make sure to: Replace any light bulbs that burn out. Turn on lights if it is dark and use night-lights. Keep items that you use often in easy-to-reach places. Lower the shelves around your home if needed. Move furniture so that there are clear paths around it. Do  not keep throw rugs or other things on the floor that can make you trip. If any of your floors are uneven, fix them. Add color or contrast paint or tape to clearly mark and help you see: Grab bars or handrails. First and last steps of staircases. Where the edge of each step is. If you use a ladder or stepladder: Make sure that it is fully opened. Do not climb a closed ladder. Make sure the sides of the ladder are locked in place. Have someone hold the ladder while you use it. Know where your pets are as you move through your home. What can I do in the bathroom?     Keep the floor dry. Clean up any water that is on the floor right away. Remove soap buildup in the bathtub or shower. Buildup makes bathtubs and showers slippery. Use non-skid mats or decals on the floor of the bathtub or shower. Attach bath mats securely with double-sided, non-slip rug tape. If you need to sit down while you are in the shower, use a non-slip stool. Install grab bars by the toilet and in the bathtub and shower. Do not use towel bars as grab bars. What can I do in the bedroom? Make sure that you have a light by your bed that is easy to reach. Do not use any sheets or blankets on your bed that hang to the floor. Have a firm bench or chair with side arms that you can use for support when you get dressed. What can I do in the kitchen? Clean up any spills right away. If you need to reach something above you, use a sturdy  step stool that has a grab bar. Keep electrical cables out of the way. Do not use floor polish or wax that makes floors slippery. What can I do with my stairs? Do not leave anything on the stairs. Make sure that you have a light switch at the top and the bottom of the stairs. Have them installed if you do not have them. Make sure that there are handrails on both sides of the stairs. Fix handrails that are broken or loose. Make sure that handrails are as long as the staircases. Install non-slip  stair treads on all stairs in your home if they do not have carpet. Avoid having throw rugs at the top or bottom of stairs, or secure the rugs with carpet tape to prevent them from moving. Choose a carpet design that does not hide the edge of steps on the stairs. Make sure that carpet is firmly attached to the stairs. Fix any carpet that is loose or worn. What can I do on the outside of my home? Use bright outdoor lighting. Repair the edges of walkways and driveways and fix any cracks. Clear paths of anything that can make you trip, such as tools or rocks. Add color or contrast paint or tape to clearly mark and help you see high doorway thresholds. Trim any bushes or trees on the main path into your home. Check that handrails are securely fastened and in good repair. Both sides of all steps should have handrails. Install guardrails along the edges of any raised decks or porches. Have leaves, snow, and ice cleared regularly. Use sand, salt, or ice melt on walkways during winter months if you live where there is ice and snow. In the garage, clean up any spills right away, including grease or oil spills. What other actions can I take? Review your medicines with your health care provider. Some medicines can make you confused or feel dizzy. This can increase your chance of falling. Wear closed-toe shoes that fit well and support your feet. Wear shoes that have rubber soles and low heels. Use a cane, walker, scooter, or crutches that help you move around if needed. Talk with your provider about other ways that you can decrease your risk of falls. This may include seeing a physical therapist to learn to do exercises to improve movement and strength. Where to find more information Centers for Disease Control and Prevention, [employer]: tonerpromos.no [employer] on Aging: baseringtones.pl National Institute on Aging: baseringtones.pl Contact a health care provider if: You are afraid of falling at home. You feel  weak, drowsy, or dizzy at home. You fall at home. Get help right away if you: Lose consciousness or have trouble moving after a fall. Have a fall that causes a head injury. These symptoms may be an emergency. Get help right away. Call 911. Do not wait to see if the symptoms will go away. Do not drive yourself to the hospital. This information is not intended to replace advice given to you by your health care provider. Make sure you discuss any questions you have with your health care provider. Document Revised: 12/12/2021 Document Reviewed: 12/12/2021 Elsevier Patient Education  2024 [employer].

## 2024-05-14 NOTE — Progress Notes (Signed)
 " [facility] [address] [place]. [place] [address] [phone]       OFFICE FOLLOW-UP NOTE  [name] Date of Birth:  1930/05/26 Medical Record Number:  [id number]   HPI: Initial visit 11/30/2021 [name] is a pleasant 89 year old Caucasian male seen today for initial office follow-up visit following hospital admission for stroke in the May 2023.  He is accompanied by his wife today.  History is obtained from them and review of electronic medical records and I personally reviewed pertinent available imaging in PACS.  Elijah Stone is a 89 y.o. male  SAH in 2021, afib with watchman on aspirin , CHF, SAH, pancytopenia, HTN, hypothyroidism, BPH, OSA with cpap initially presenting with left facial droop, left arm weakness, and dysarthria. Symptoms fluctuated throughout assessment and CT scan. CTA showed a right M2 occlusion, code IR activated.  NIH stroke scale was 7 on admission.  CT scan was unremarkable with aspect score of 10.  CT angiogram showed mid right M2 occlusion and moderate proximal left P2 and mild to moderate left M2 stenosis.  MRI scan showed acute infarct involving right insula, right temporal lobe and mild subarachnoid hemorrhage along the right temporal and frontoparietal regions.  2D echo showed ejection fraction of 30 to 35% with grade 3 diastolic dysfunction and severely dilated left atrium.  TEE performed to visualize the Watchman device showed a 5 mm right Fluorographic 45 degrees at the anterior lateral aspect of the device.  No definite clot was noted.  LDL cholesterol was 103 mg percent and hemoglobin A1c 5.7.  Patient had been on warfarin in the past for his A-fib but stopped taking it after developing traumatic subarachnoid hemorrhage underwent Watchman device procedure and dysarthria 8 to 10 years ago.  Patient was initially started on aspirin  due to subarachnoid hemorrhage and.  Resolved few weeks later he was switched to Eliquis  due to his age he is  currently on 2.5 mg twice daily which is tolerating well with minor bruising but no bleeding episodes.  His blood pressure tends to run low and his metoprolol was discontinued but still 94/54 today.  Physical occupational and speech therapy.  He states he has no physical weakness from stroke but does complain of generalized weakness.  He gets short of breath with exertion.  He is also complaining of further ringing sound in his left ear this is like a buzzing sound off a elective motor.  He has not yet discussed this with his primary care physician. Update 06/26/2022 : He returns for follow-up after last visit 6 months ago.  He is accompanied by his wife.  He states he is doing well.  He is tolerating Eliquis  well without bleeding or bruising.  His blood pressure is well-controlled usually low.  Uses a cane and is careful with his walking.  No falls or injuries..  Fractured his left humerus about 5 months ago.  After conservative treatment.  He still has limitation of left shoulder.  He still mostly independent in most activities and needs only little help with taking a shower.  He continues to live in a townhouse with his wife.  He developed pneumonia middle of January 2024 but he is recovering well from this now Update 05/13/2024 : He returns for follow-up after last visit with me nearly 2 years ago.  He is accompanied by his wife and son patient is here today for follow-up after recent ER visit on 1/5 26 with a fall.He fell and struck his head.  Per EMS report, patient was reaching backwards for his walker and fell backwards. He denies any headache or neck pain.  CT scan of the head showed no acute abnormality.  Old right MCA infarct and left occipital infarct with changes of small vessel disease were noted.  CT cervical spine showed no fracture.  MRI scan of the brain showed no acute abnormality but old right hemispheric infarcts and white matter changes.There were chronic microhemorrhages and superficial  siderosis changes on the right convexity raising concern for cerebral amyloid angiopathy.  The family noted that the patient was cognitively quite slow complaining of some shoulder pain and difficulties with his mobility but that seems to be improving gradually though is still not back to his baseline.  I had a long discussion with the patient and family about risk-benefit of anticoagulation in the setting of A-fib and prior stroke versus increased risk of brain hemorrhage with findings concerning for cerebral amyloid angiopathy.  Given his reasonable functional status family is more concerned about him having a devastating stroke if anticoagulation is held.  Agreeable with continuing Eliquis  for now.  LDL cholesterol on 01/24/2024 was 36 mg percent. ROS:   14 system review of systems is positive for decreased hearing, gait difficulty, bruising, decreased stamina, weakness, shortness of breath with exertion all other systems negative  PMH:  Past Medical History:  Diagnosis Date   Atrial fibrillation (HCC)    CVA (cerebral vascular accident) (HCC)    Hard of hearing     Social History:  Social History   Socioeconomic History   Marital status: Married    Spouse name: Not on file   Number of children: 4   Years of education: college   Highest education level: Master's degree (e.g., MA, MS, MEng, MEd, MSW, MBA)  Occupational History   Occupation: Retired education officer, environmental  Tobacco Use   Smoking status: Former    Current packs/day: 0.00    Average packs/day: 1 pack/day for 4.0 years (4.0 ttl pk-yrs)    Types: Cigarettes    Start date: 60    Quit date: 1956    Years since quitting: 70.1   Smokeless tobacco: Never  Substance and Sexual Activity   Alcohol  use: Not Currently   Drug use: Never   Sexual activity: Not on file  Other Topics Concern   Not on file  Social History Narrative   Lives at home with his wife.   Right-handed.   Caffeine use: 3 cups per day (coffee - 1/2 caff)   Social  Drivers of Health   Tobacco Use: Medium Risk (05/14/2024)   Patient History    Smoking Tobacco Use: Former    Smokeless Tobacco Use: Never    Passive Exposure: Not on Actuary Strain: Not on file  Food Insecurity: No Food Insecurity (03/05/2024)   Epic    Worried About Programme Researcher, Broadcasting/film/video in the Last Year: Never true    Ran Out of Food in the Last Year: Never true  Transportation Needs: No Transportation Needs (03/05/2024)   Epic    Lack of Transportation (Medical): No    Lack of Transportation (Non-Medical): No  Physical Activity: Not on file  Stress: Not on file  Social Connections: Unknown (03/05/2024)   Social Connection and Isolation Panel    Frequency of Communication with Friends and Family: More than three times a week    Frequency of Social Gatherings with Friends and Family: More than three times a week  Attends Religious Services: More than 4 times per year    Active Member of Clubs or Organizations: Patient declined    Attends Banker Meetings: Not on file    Marital Status: Patient declined  Intimate Partner Violence: Not At Risk (03/05/2024)   Epic    Fear of Current or Ex-Partner: No    Emotionally Abused: No    Physically Abused: No    Sexually Abused: No  Depression (PHQ2-9): Not on file  Alcohol  Screen: Not on file  Housing: Low Risk (03/05/2024)   Epic    Unable to Pay for Housing in the Last Year: No    Number of Times Moved in the Last Year: 0    Homeless in the Last Year: No  Utilities: Not At Risk (03/05/2024)   Epic    Threatened with loss of utilities: No  Health Literacy: Not on file    Medications:   Current Outpatient Medications on File Prior to Visit  Medication Sig Dispense Refill   acetaminophen  (TYLENOL ) 500 MG tablet Take 500 mg by mouth at bedtime.     apixaban  (ELIQUIS ) 2.5 MG TABS tablet Take 1 tablet (2.5 mg total) by mouth 2 (two) times daily. 180 tablet 1   cholecalciferol  (VITAMIN D3) 25 MCG (1000  UNIT) tablet Take 1,000 Units by mouth daily.     cycloSPORINE  (RESTASIS ) 0.05 % ophthalmic emulsion Place 1 drop into both eyes 2 (two) times daily.     ferrous sulfate  325 (65 FE) MG tablet Take 1 tablet (325 mg total) by mouth 2 (two) times daily with a meal.     fluticasone (FLONASE) 50 MCG/ACT nasal spray Place 1 spray into both nostrils daily.     latanoprost  (XALATAN ) 0.005 % ophthalmic solution Place 1 drop into both eyes at bedtime.     levocetirizine (XYZAL) 5 MG tablet Take 5 mg by mouth every evening.     levothyroxine  (SYNTHROID ) 88 MCG tablet Take 88 mcg by mouth daily before breakfast.     polyethylene glycol powder (MIRALAX ) 17 GM/SCOOP powder Take 17 g by mouth daily.     rosuvastatin  (CRESTOR ) 20 MG tablet Take 1 tablet (20 mg total) by mouth daily. 30 tablet 1   Sodium Chloride , Hypertonic, (MURO 128 OP) Apply 1 drop to eye 2 (two) times daily.     cholestyramine  (QUESTRAN ) 4 g packet Take 4 g by mouth See admin instructions. Take 1 packet (4 g total) by mouth 2 (two) times a day. Take one packet twice daily (make sure other drugs are taken either 2 hours prior to cholestyramine  or 4 hours after cholestyramine ) (Patient not taking: Reported on 05/14/2024)     fluocinonide cream (LIDEX) 0.05 % Apply 1 application. topically 2 (two) times daily as needed (rash). (Patient not taking: Reported on 05/14/2024)     hydrOXYzine  (ATARAX ) 25 MG tablet Take 1 tablet (25 mg total) by mouth at bedtime as needed for anxiety. (Patient not taking: Reported on 05/14/2024) 12 tablet 0   omeprazole (PRILOSEC) 40 MG capsule Take 40 mg by mouth daily. (Patient not taking: Reported on 05/14/2024)     sacubitril -valsartan  (ENTRESTO ) 24-26 MG Take 1 tablet by mouth at bedtime. (Patient not taking: Reported on 05/14/2024)     No current facility-administered medications on file prior to visit.    Allergies:   Allergies  Allergen Reactions   Codeine     Hallucinations per patient's wife by phone.     Physical Exam General: Frail elderly Caucasian  male, seated, in no evident distress Head: head normocephalic and atraumatic.  Neck: supple with no carotid or supraclavicular bruits Cardiovascular: regular rate and rhythm, no murmurs Musculoskeletal: no deformity Skin:  no rash/petichiae Vascular:  Normal pulses all extremities Vitals:   05/14/24 1542  BP: 119/64  Pulse: 64  SpO2: 99%   Neurologic Exam Mental Status: Awake and fully alert. Oriented to place and time. Recent and remote memory intact. Attention span, concentration and fund of knowledge appropriate. Mood and affect appropriate.  MMSE 27/30.  Able to name 13 animals which can walk on forelegs.  Clock drawing 4/4. Cranial Nerves: Fundoscopic exam not done Pupils equal, briskly reactive to light. Extraocular movements full without nystagmus. Visual fields full to confrontation. Hearing  greatly diminished bilaterally.  Facial sensation intact. Face, tongue, palate moves normally and symmetrically.  Motor: Normal bulk and tone. Normal strength in all tested extremity muscles.  Diminished fine finger movements on the left.  Orbits right over left upper extremity.  Flaccid left leg a little bit while walking. Sensory.: intact to touch ,pinprick .position and vibratory sensation.  Coordination: Rapid alternating movements normal in all extremities. Finger-to-nose and heel-to-shin performed accurately bilaterally. Gait and Station: Arises from chair with difficulty. Stance is stooped.  Uses a walker.  Slightly unsteady when he first gets up or Naseptin.  Reflexes: 1+ and symmetric. Toes downgoing.   NIHSS  0 Modified Rankin  2   ASSESSMENT: 89 year old Caucasian male with right MCA infarct secondary to right M2 occlusion s/p mechanical thrombectomy secondary to embolism in the setting of atrial fibrillation not on anticoagulation s/p Watchman procedure with a leak.  Vascular risk factors of hypertension and atrial fibrillation  and age.  Recent mechanical fall on 04/28/2024 with mild concussion from which she seems to be improving.  Abnormal MRI showing several cortical microhemorrhages 1 area of superficial siderosis raising concern for cerebral amyloid angiopathy.  No significant cognitive deficits on exam     PLAN: I had a long d/w patient and his wife about his remote stroke, recent closed head injury and concussion, chronic atrial fibrillation, watchman device and leakage,risk for recurrent stroke/TIAs, personally independently reviewed imaging studies and stroke evaluation results and answered questions.I also discussed results of MRI showing increasing microhemorrhages and superficial siderosis raising concern for cerebral amyloid angiopathy.  Patient has a high risk of intracerebral hemorrhage on anticoagulation but without anticoagulation with Eliquis  he also has significant risk of embolic strokes.  This is treatment conundrum and there is no safe or easy answer here.  We discussed risk-benefit of anticoagulation and answered questions continue Eliquis  2.5 mg twice daily for secondary stroke prevention and maintain strict control of hypertension with blood pressure goal below 130/90, diabetes with hemoglobin A1c goal below 6.5% and lipids with LDL cholesterol goal below 70 mg/dL. I also advised the patient to eat a healthy diet with plenty of whole grains, cereals, fruits and vegetables, exercise regularly and maintain ideal body weight Followup in the future with me only as needed.  I advised the patient to use a walker at all times and to get up slowly and avoid sudden movements.  We discussed fall prevention precautions.   I personally spent a total of 40 minutes in the care of the patient today including getting/reviewing separately obtained history, performing a medically appropriate exam/evaluation, counseling and educating, placing orders, referring and communicating with other health care professionals, documenting  clinical information in the EHR, independently interpreting results, and coordinating care.  Eather Popp, MD Note: This document was prepared with digital dictation and possible smart phrase technology. Any transcriptional errors that result from this process are unintentional "

## 2025-05-28 ENCOUNTER — Ambulatory Visit: Admitting: Neurology
